# Patient Record
Sex: Male | Born: 1950 | Race: White | Hispanic: No | Marital: Married | Smoking: Never smoker
Health system: Southern US, Community
[De-identification: ages and names within clinical notes are randomized; demographics above are authoritative.]

## PROBLEM LIST (undated history)

## (undated) DIAGNOSIS — F32A Depression, unspecified: Secondary | ICD-10-CM

## (undated) DIAGNOSIS — I639 Cerebral infarction, unspecified: Secondary | ICD-10-CM

## (undated) DIAGNOSIS — F329 Major depressive disorder, single episode, unspecified: Secondary | ICD-10-CM

## (undated) DIAGNOSIS — G4730 Sleep apnea, unspecified: Secondary | ICD-10-CM

## (undated) DIAGNOSIS — I2510 Atherosclerotic heart disease of native coronary artery without angina pectoris: Secondary | ICD-10-CM

## (undated) DIAGNOSIS — I10 Essential (primary) hypertension: Secondary | ICD-10-CM

## (undated) DIAGNOSIS — I219 Acute myocardial infarction, unspecified: Secondary | ICD-10-CM

## (undated) HISTORY — DX: Depression, unspecified: F32.A

## (undated) HISTORY — DX: Major depressive disorder, single episode, unspecified: F32.9

## (undated) HISTORY — DX: Sleep apnea, unspecified: G47.30

---

## 2014-05-12 ENCOUNTER — Encounter (HOSPITAL_COMMUNITY)
Admission: EM | Disposition: A | Discharge status: Home / Self Care | Payer: BC Managed Care – PPO | Source: Other Acute Inpatient Hospital | Attending: Cardiology

## 2014-05-12 ENCOUNTER — Inpatient Hospital Stay (HOSPITAL_COMMUNITY)
Admission: EM | Admit: 2014-05-12 | Discharge: 2014-05-16 | DRG: 246 | Disposition: A | Discharge status: Home / Self Care | Payer: BC Managed Care – PPO | Source: Other Acute Inpatient Hospital | Attending: Cardiology | Admitting: Cardiology

## 2014-05-12 ENCOUNTER — Inpatient Hospital Stay (HOSPITAL_COMMUNITY): Payer: BC Managed Care – PPO

## 2014-05-12 ENCOUNTER — Encounter (HOSPITAL_COMMUNITY): Payer: Self-pay | Admitting: Emergency Medicine

## 2014-05-12 DIAGNOSIS — Z8249 Family history of ischemic heart disease and other diseases of the circulatory system: Secondary | ICD-10-CM | POA: Diagnosis not present

## 2014-05-12 DIAGNOSIS — I63441 Cerebral infarction due to embolism of right cerebellar artery: Secondary | ICD-10-CM | POA: Diagnosis present

## 2014-05-12 DIAGNOSIS — I6340 Cerebral infarction due to embolism of unspecified cerebral artery: Secondary | ICD-10-CM

## 2014-05-12 DIAGNOSIS — Z955 Presence of coronary angioplasty implant and graft: Secondary | ICD-10-CM

## 2014-05-12 DIAGNOSIS — I639 Cerebral infarction, unspecified: Secondary | ICD-10-CM

## 2014-05-12 DIAGNOSIS — I10 Essential (primary) hypertension: Secondary | ICD-10-CM | POA: Diagnosis present

## 2014-05-12 DIAGNOSIS — I4892 Unspecified atrial flutter: Secondary | ICD-10-CM | POA: Diagnosis present

## 2014-05-12 DIAGNOSIS — I63411 Cerebral infarction due to embolism of right middle cerebral artery: Secondary | ICD-10-CM | POA: Diagnosis not present

## 2014-05-12 DIAGNOSIS — I2109 ST elevation (STEMI) myocardial infarction involving other coronary artery of anterior wall: Principal | ICD-10-CM

## 2014-05-12 DIAGNOSIS — E780 Pure hypercholesterolemia: Secondary | ICD-10-CM | POA: Diagnosis present

## 2014-05-12 DIAGNOSIS — I480 Paroxysmal atrial fibrillation: Secondary | ICD-10-CM | POA: Diagnosis present

## 2014-05-12 DIAGNOSIS — I2129 ST elevation (STEMI) myocardial infarction involving other sites: Secondary | ICD-10-CM

## 2014-05-12 DIAGNOSIS — E785 Hyperlipidemia, unspecified: Secondary | ICD-10-CM

## 2014-05-12 DIAGNOSIS — G8192 Hemiplegia, unspecified affecting left dominant side: Secondary | ICD-10-CM | POA: Diagnosis present

## 2014-05-12 DIAGNOSIS — I2510 Atherosclerotic heart disease of native coronary artery without angina pectoris: Secondary | ICD-10-CM | POA: Diagnosis present

## 2014-05-12 DIAGNOSIS — M25512 Pain in left shoulder: Secondary | ICD-10-CM | POA: Diagnosis present

## 2014-05-12 DIAGNOSIS — I236 Thrombosis of atrium, auricular appendage, and ventricle as current complications following acute myocardial infarction: Secondary | ICD-10-CM | POA: Diagnosis present

## 2014-05-12 HISTORY — PX: LEFT HEART CATHETERIZATION WITH CORONARY ANGIOGRAM: SHX5451

## 2014-05-12 LAB — CBC WITH DIFFERENTIAL/PLATELET
Basophils Absolute: 0 10*3/uL (ref 0.0–0.1)
Basophils Relative: 0 % (ref 0–1)
EOS PCT: 2 % (ref 0–5)
Eosinophils Absolute: 0.1 10*3/uL (ref 0.0–0.7)
HCT: 35.1 % — ABNORMAL LOW (ref 39.0–52.0)
Hemoglobin: 12.7 g/dL — ABNORMAL LOW (ref 13.0–17.0)
LYMPHS ABS: 1.5 10*3/uL (ref 0.7–4.0)
LYMPHS PCT: 25 % (ref 12–46)
MCH: 31.9 pg (ref 26.0–34.0)
MCHC: 36.2 g/dL — ABNORMAL HIGH (ref 30.0–36.0)
MCV: 88.2 fL (ref 78.0–100.0)
MONO ABS: 0.8 10*3/uL (ref 0.1–1.0)
Monocytes Relative: 13 % — ABNORMAL HIGH (ref 3–12)
Neutro Abs: 3.8 10*3/uL (ref 1.7–7.7)
Neutrophils Relative %: 60 % (ref 43–77)
Platelets: 211 10*3/uL (ref 150–400)
RBC: 3.98 MIL/uL — AB (ref 4.22–5.81)
RDW: 12.1 % (ref 11.5–15.5)
WBC: 6.3 10*3/uL (ref 4.0–10.5)

## 2014-05-12 LAB — APTT: APTT: 39 s — AB (ref 24–37)

## 2014-05-12 LAB — COMPREHENSIVE METABOLIC PANEL
ALBUMIN: 2.9 g/dL — AB (ref 3.5–5.2)
ALT: 15 U/L (ref 0–53)
ANION GAP: 13 (ref 5–15)
AST: 15 U/L (ref 0–37)
Alkaline Phosphatase: 60 U/L (ref 39–117)
BUN: 13 mg/dL (ref 6–23)
CO2: 24 mEq/L (ref 19–32)
CREATININE: 0.92 mg/dL (ref 0.50–1.35)
Calcium: 8.6 mg/dL (ref 8.4–10.5)
Chloride: 100 mEq/L (ref 96–112)
GFR calc Af Amer: >90 mL/min (ref >90)
GFR calc non Af Amer: 90 mL/min — ABNORMAL LOW (ref >90)
Glucose, Bld: 115 mg/dL — ABNORMAL HIGH (ref 70–99)
Potassium: 3.8 mEq/L (ref 3.7–5.3)
Sodium: 137 mEq/L (ref 137–147)
TOTAL PROTEIN: 6.2 g/dL (ref 6.0–8.3)
Total Bilirubin: 0.3 mg/dL (ref 0.3–1.2)

## 2014-05-12 LAB — I-STAT TROPONIN, ED: TROPONIN I, POC: 4.5 ng/mL — AB (ref 0.00–0.08)

## 2014-05-12 LAB — CBC
HEMATOCRIT: 37.5 % — AB (ref 39.0–52.0)
Hemoglobin: 13.5 g/dL (ref 13.0–17.0)
MCH: 32 pg (ref 26.0–34.0)
MCHC: 36 g/dL (ref 30.0–36.0)
MCV: 88.9 fL (ref 78.0–100.0)
Platelets: 210 10*3/uL (ref 150–400)
RBC: 4.22 MIL/uL (ref 4.22–5.81)
RDW: 12.1 % (ref 11.5–15.5)
WBC: 7.1 10*3/uL (ref 4.0–10.5)

## 2014-05-12 LAB — PROTIME-INR
INR: 1.45 (ref 0.00–1.49)
PROTHROMBIN TIME: 17.6 s — AB (ref 11.6–15.2)

## 2014-05-12 LAB — MRSA PCR SCREENING: MRSA by PCR: NEGATIVE

## 2014-05-12 IMAGING — CT CT HEAD W/O CM
2 series · 15 of 30 positions shown, 19 images · non-contrast
Comparison: None.

CLINICAL DATA: Altered mental status ; left-sided weakness

EXAM:
CT HEAD WITHOUT CONTRAST
TECHNIQUE: Contiguous axial images were obtained from the base of the skull
through the vertex without intravenous contrast. Note that the
patient received contrast earlier in the day from cardiac
catheterization.

[Series 201: head w/o, idose (1) · axial · non-contrast · 0.44mm/px · z∈[+273,+403]mm · 13 of 32 slices shown, 17 images]
[im 3/32  brain]
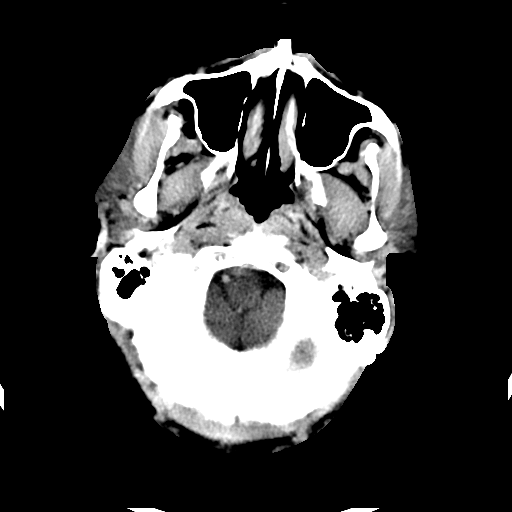
[im 3/32  bone]
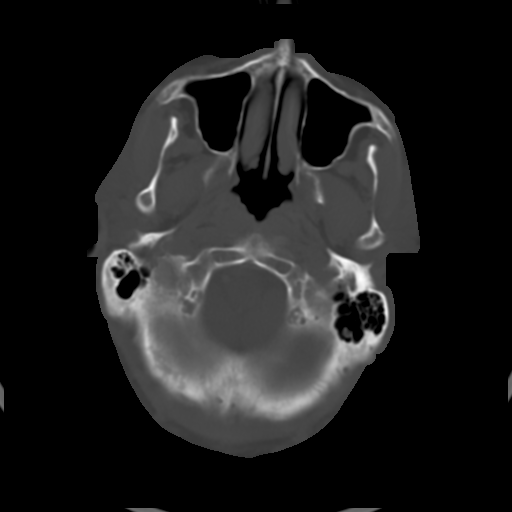
[im 5/32  brain]
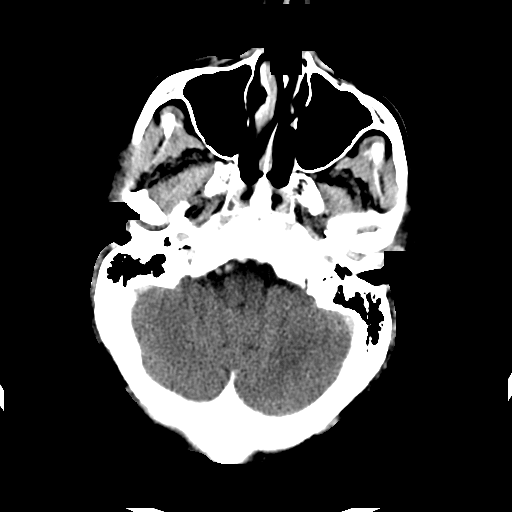
[im 7/32  brain]
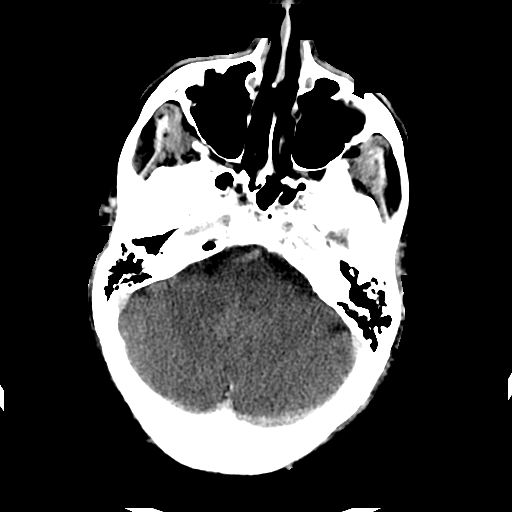
[im 9/32  brain]
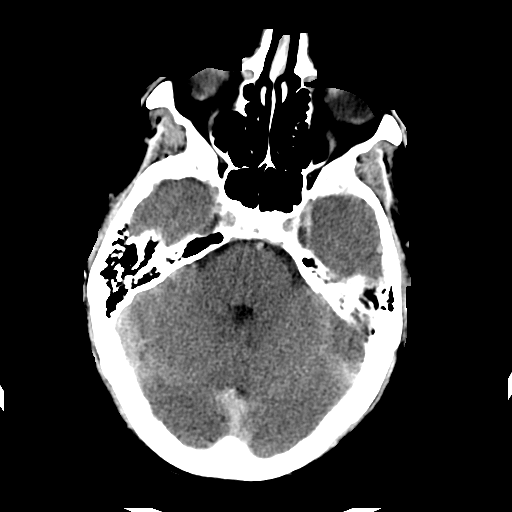
[im 12/32  brain]
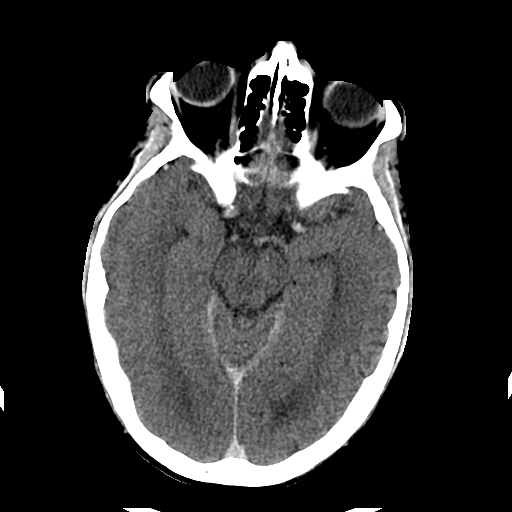
[im 12/32  bone]
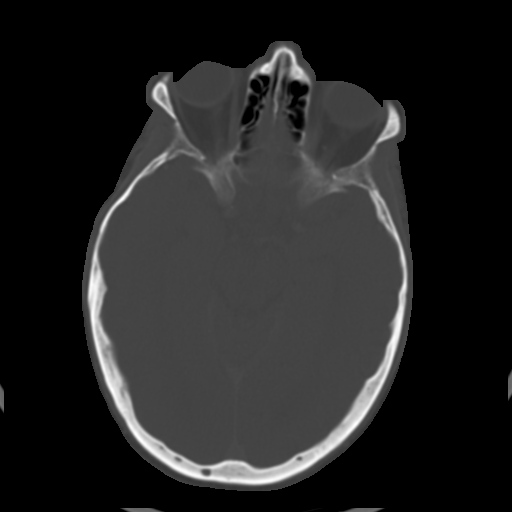
[im 14/32  brain]
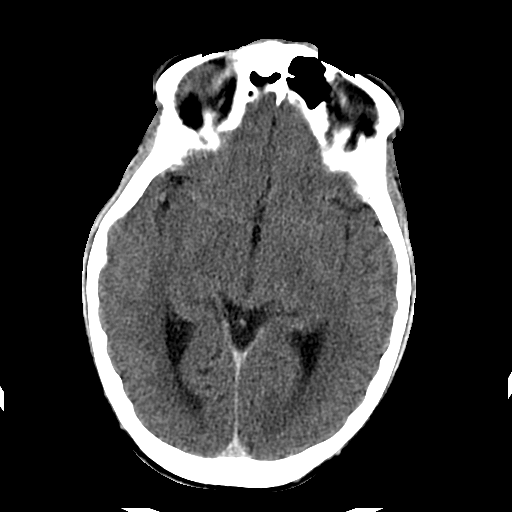
[im 16/32  brain]
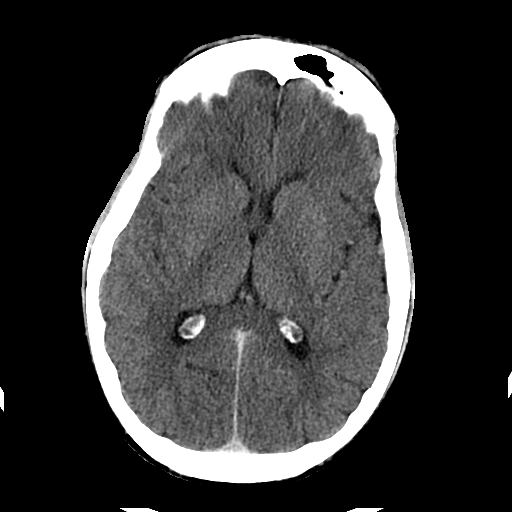
[im 18/32  brain]
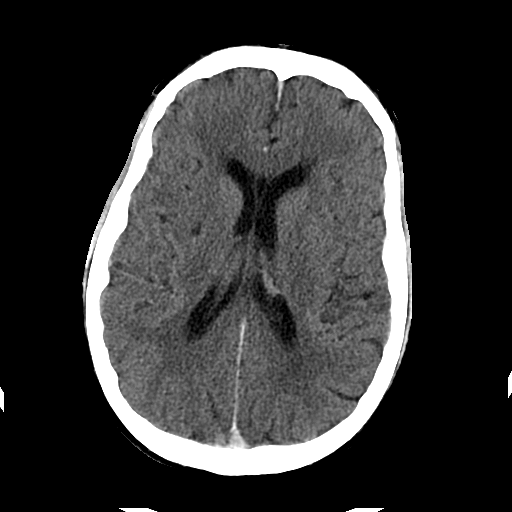
[im 20/32  brain]
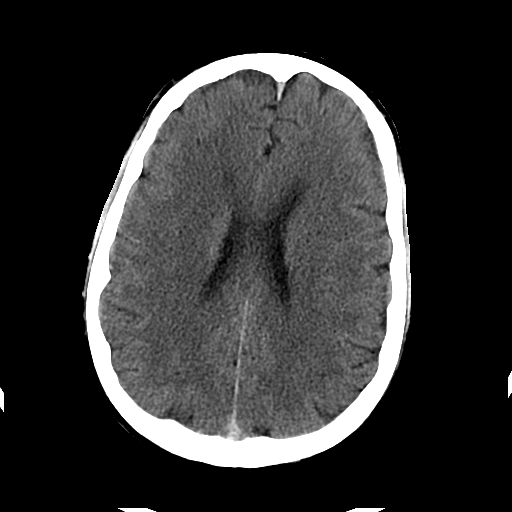
[im 20/32  bone]
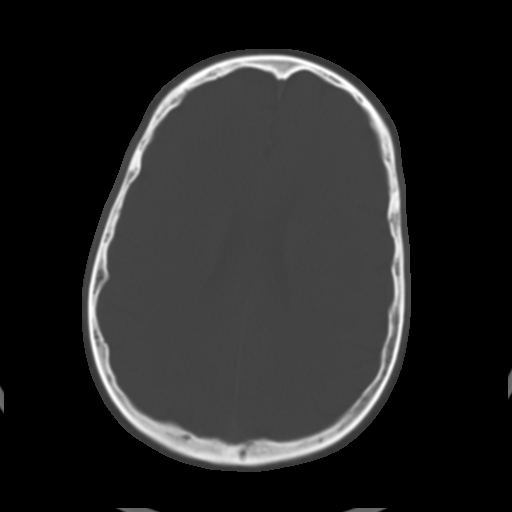
[im 23/32  brain]
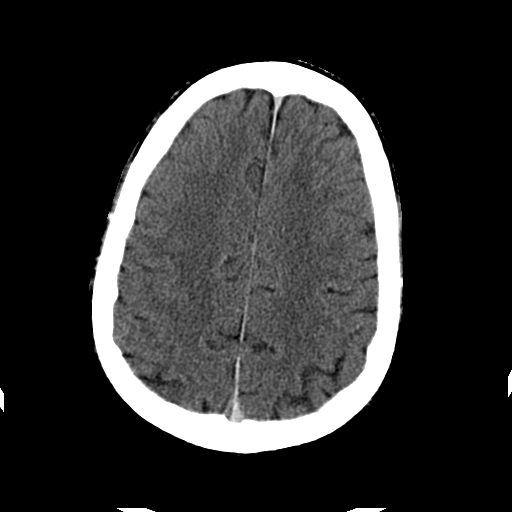
[im 25/32  brain]
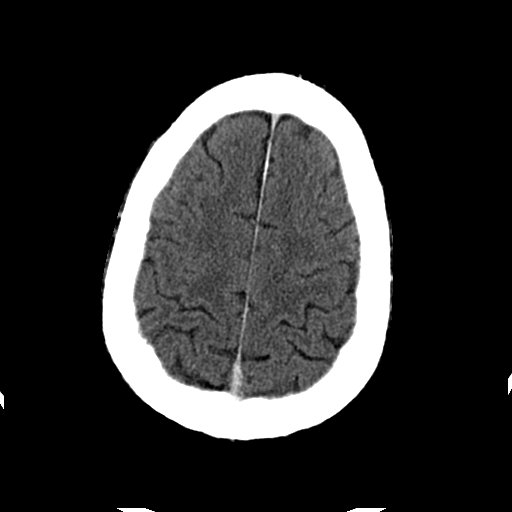
[im 27/32  brain]
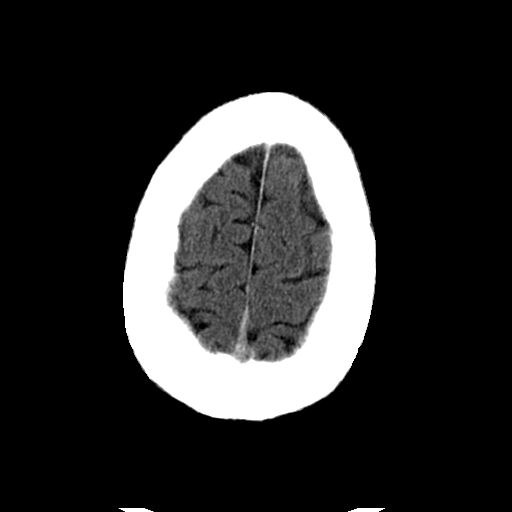
[im 29/32  brain]
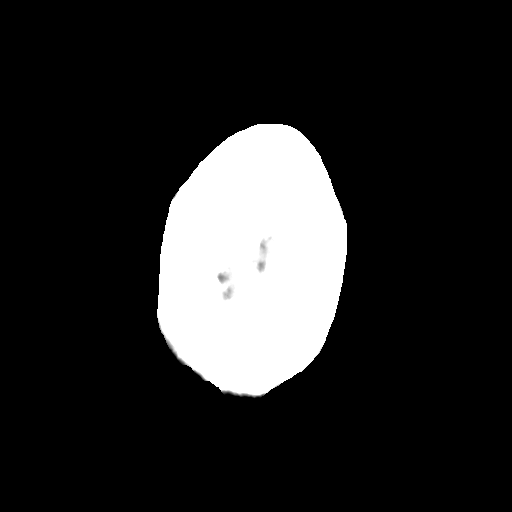
[im 29/32  bone]
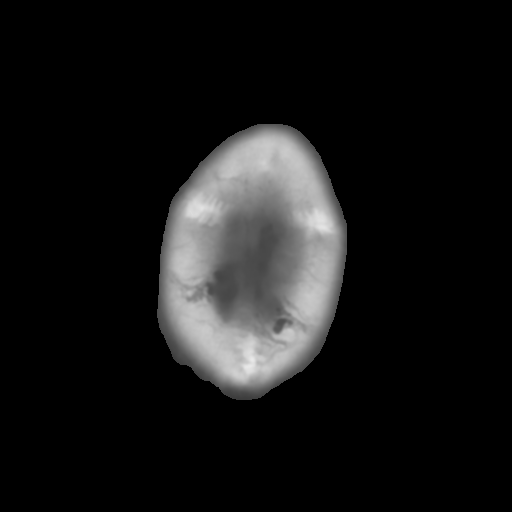

[Series 202: head w/o bone, idose (1) · axial · non-contrast · 0.44mm/px · z∈[+273,+293]mm · 2 of 32 slices shown]
[im 3/32  bone]
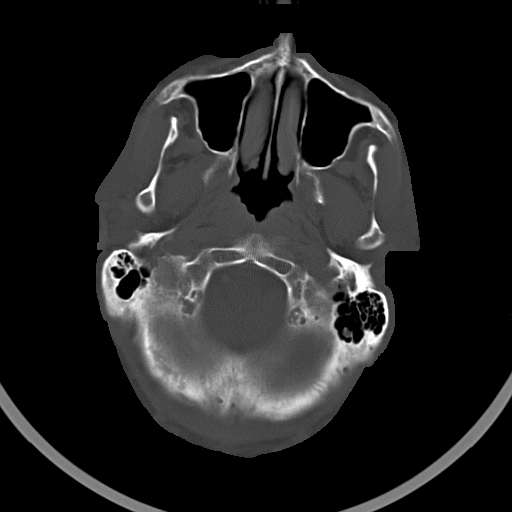
[im 7/32  bone]
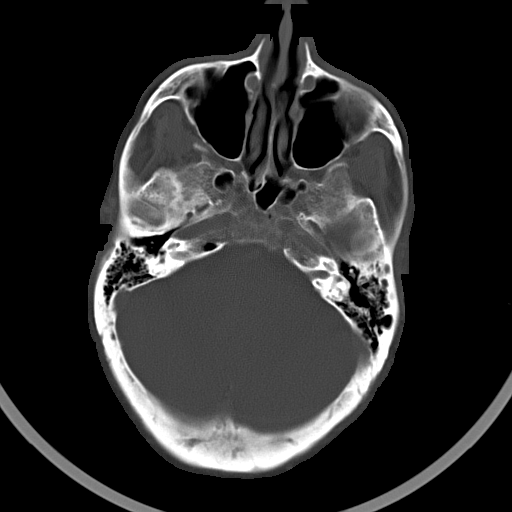

[15 of 30 positions shown; findings below may reference images not displayed]

FINDINGS: The ventricles are normal in size and configuration. There is no
appreciable mass, hemorrhage, extra-axial fluid collection, or
midline shift. There is slight small vessel disease in the centra
semiovale bilaterally. Elsewhere gray-white compartments appear
normal. There is no demonstrable acute infarct.

On axial slice 13 series 201, there is a 4 mm focus of enhancement
near the junction of the M1 and M2 segments of the right middle
cerebral artery. This finding raises question of a small aneurysm
arising from this site.

The bony calvarium appears intact.  The mastoid air cells are clear.
IMPRESSION: Question 4 mm aneurysm near the M1 -M2 junction of the right middle
cerebral artery. This finding may well warrant CT or MR angiography
to further assess. The patient has already had a dose of iodinated
contrast earlier today; advise appropriate hydration before
repeating dose of iodinated contrast nonemergently.

There is slight periventricular small vessel disease. No
intracranial mass, hemorrhage, or acute appearing infarct.

## 2014-05-12 SURGERY — LEFT HEART CATHETERIZATION WITH CORONARY ANGIOGRAM
Anesthesia: LOCAL

## 2014-05-12 MED ORDER — HEPARIN SODIUM (PORCINE) 5000 UNIT/ML IJ SOLN
INTRAMUSCULAR | Status: AC
  Filled 2014-05-12: qty 1

## 2014-05-12 MED ORDER — LIDOCAINE HCL (PF) 1 % IJ SOLN
INTRAMUSCULAR | Status: AC
  Filled 2014-05-12: qty 30

## 2014-05-12 MED ORDER — SODIUM CHLORIDE 0.9 % IV SOLN: INTRAVENOUS | Status: AC

## 2014-05-12 MED ORDER — HEPARIN (PORCINE) IN NACL 2-0.9 UNIT/ML-% IJ SOLN
INTRAMUSCULAR | Status: AC
  Filled 2014-05-12: qty 1000

## 2014-05-12 MED ORDER — HEPARIN SODIUM (PORCINE) 5000 UNIT/ML IJ SOLN
4000.0000 [IU] | Freq: Once | INTRAMUSCULAR | Status: AC
  Administered 2014-05-12: 4000 [IU] via INTRAVENOUS

## 2014-05-12 MED ORDER — ASPIRIN EC 81 MG PO TBEC
81.0000 mg | DELAYED_RELEASE_TABLET | Freq: Every day | ORAL | Status: DC
  Administered 2014-05-13 – 2014-05-16 (×4): 81 mg via ORAL
  Filled 2014-05-12 (×4): qty 1

## 2014-05-12 MED ORDER — TIROFIBAN HCL IV 12.5 MG/250 ML
0.1500 ug/kg/min | INTRAVENOUS | Status: AC
  Administered 2014-05-12 – 2014-05-13 (×2): 0.15 ug/kg/min via INTRAVENOUS
  Filled 2014-05-12: qty 250

## 2014-05-12 MED ORDER — ATORVASTATIN CALCIUM 80 MG PO TABS
80.0000 mg | ORAL_TABLET | Freq: Every day | ORAL | Status: DC
  Administered 2014-05-12 – 2014-05-15 (×4): 80 mg via ORAL
  Filled 2014-05-12 (×5): qty 1

## 2014-05-12 MED ORDER — PANTOPRAZOLE SODIUM 40 MG PO TBEC
40.0000 mg | DELAYED_RELEASE_TABLET | Freq: Every day | ORAL | Status: DC
  Administered 2014-05-13 – 2014-05-16 (×4): 40 mg via ORAL
  Filled 2014-05-12 (×4): qty 1

## 2014-05-12 MED ORDER — TICAGRELOR 90 MG PO TABS
ORAL_TABLET | ORAL | Status: AC
  Filled 2014-05-12: qty 1

## 2014-05-12 MED ORDER — ACETAMINOPHEN 325 MG PO TABS
650.0000 mg | ORAL_TABLET | ORAL | Status: DC | PRN
  Administered 2014-05-13: 650 mg via ORAL
  Filled 2014-05-12: qty 2

## 2014-05-12 MED ORDER — ONDANSETRON HCL 4 MG/2ML IJ SOLN: 4.0000 mg | Freq: Four times a day (QID) | INTRAMUSCULAR | Status: DC | PRN

## 2014-05-12 MED ORDER — TICAGRELOR 90 MG PO TABS: 90.0000 mg | ORAL_TABLET | Freq: Two times a day (BID) | ORAL | Status: DC

## 2014-05-12 MED ORDER — ASPIRIN 81 MG PO CHEW: 81.0000 mg | CHEWABLE_TABLET | Freq: Every day | ORAL | Status: DC

## 2014-05-12 MED ORDER — SODIUM CHLORIDE 0.9 % IV SOLN
0.2500 mg/kg/h | INTRAVENOUS | Status: DC
  Filled 2014-05-12: qty 250

## 2014-05-12 MED ORDER — ASPIRIN 81 MG PO CHEW: 324.0000 mg | CHEWABLE_TABLET | ORAL | Status: AC

## 2014-05-12 MED ORDER — NITROGLYCERIN 0.4 MG SL SUBL: 0.4000 mg | SUBLINGUAL_TABLET | SUBLINGUAL | Status: DC | PRN

## 2014-05-12 MED ORDER — BIVALIRUDIN 250 MG IV SOLR
INTRAVENOUS | Status: AC
  Filled 2014-05-12: qty 250

## 2014-05-12 MED ORDER — MIDAZOLAM HCL 2 MG/2ML IJ SOLN
INTRAMUSCULAR | Status: AC
  Filled 2014-05-12: qty 2

## 2014-05-12 MED ORDER — CARVEDILOL 3.125 MG PO TABS
3.1250 mg | ORAL_TABLET | Freq: Two times a day (BID) | ORAL | Status: DC
  Administered 2014-05-13 – 2014-05-16 (×7): 3.125 mg via ORAL
  Filled 2014-05-12 (×9): qty 1

## 2014-05-12 MED ORDER — SODIUM CHLORIDE 0.9 % IV SOLN: INTRAVENOUS | Status: DC

## 2014-05-12 MED ORDER — ACETAMINOPHEN 325 MG PO TABS: 650.0000 mg | ORAL_TABLET | ORAL | Status: DC | PRN

## 2014-05-12 MED ORDER — FENTANYL CITRATE 0.05 MG/ML IJ SOLN
INTRAMUSCULAR | Status: AC
  Filled 2014-05-12: qty 2

## 2014-05-12 MED ORDER — NITROGLYCERIN 0.2 MG/ML ON CALL CATH LAB
INTRAVENOUS | Status: AC
  Filled 2014-05-12: qty 1

## 2014-05-12 MED ORDER — TICAGRELOR 90 MG PO TABS
90.0000 mg | ORAL_TABLET | Freq: Two times a day (BID) | ORAL | Status: DC
  Administered 2014-05-13 – 2014-05-16 (×7): 90 mg via ORAL
  Filled 2014-05-12 (×9): qty 1

## 2014-05-12 MED ORDER — TIROFIBAN HCL IV 5 MG/100ML
INTRAVENOUS | Status: AC
  Filled 2014-05-12: qty 200

## 2014-05-12 MED ORDER — ASPIRIN 300 MG RE SUPP
300.0000 mg | RECTAL | Status: AC
  Filled 2014-05-12: qty 1

## 2014-05-12 MED ORDER — NITROGLYCERIN IN D5W 200-5 MCG/ML-% IV SOLN: 5.0000 ug/min | INTRAVENOUS | Status: DC

## 2014-05-12 MED ORDER — LISINOPRIL 2.5 MG PO TABS
2.5000 mg | ORAL_TABLET | Freq: Every day | ORAL | Status: DC
  Administered 2014-05-13 – 2014-05-16 (×4): 2.5 mg via ORAL
  Filled 2014-05-12 (×4): qty 1

## 2014-05-12 NOTE — Cardiovascular Report (Signed)
[name]:  Shields, Gregory                ACCOUNT NO.:  [id number]  MEDICAL RECORD NO.:  [id number]  LOCATION:  2H13C                        FACILITY:  [facility]  PHYSICIAN:  Sindee Stucker N. Sharyn LullHarwani, M.D. DATE OF BIRTH:  20-May-1951  DATE OF PROCEDURE:  05/12/2014 DATE OF DISCHARGE:                           CARDIAC CATHETERIZATION   PROCEDURES PERFORMED: 1. Left cardiac cath with selective left and right coronary     angiography, left ventriculography via right groin using Judkins     technique. 2. Successful percutaneous transluminal coronary angioplasty to mid     and distal left anterior descending artery using initially 2.5 x 12-     mm long Emerge balloon and then 2.5 x 20-mm long and then 2.5 x 20-     mm long Emerge balloon for predilatation. 3. Successful deployment of 2.5 x 38-mm long Xience Alpine drug-     eluting stent in distal left anterior descending artery. 4. Successful postdilatation of this stent using 2.5 x 20-mm long [place]     Emerge balloon going up to 15 atmospheric pressure. 5. Successful deployment of 3.0 x 23-mm long Xience Alpine drug-     eluting stent in mid left anterior descending artery. 6. Successful postdilatation of this stent using 3.0 x 20-mm long [place]     Emerge balloon going up to 15 atmospheric pressure.  INDICATION FOR THE PROCEDURE:  Gregory Shields is 63 year old male with no significant family history of coronary artery disease.  He came to the ER by EMS as Code STEMI was called.  The patient complained of retrosternal chest pain described as pressure last Saturday, off and on, radiating to the shoulder and arm, did not seek any medical attention. Today around 1 p.m., had weakness in the left arm, did not seek any medical attention.  In the evening when he had similar episodes of weakness and pain in the left arm, his wife called EMS.  The patient denies any chest pain, nausea, vomiting, diaphoresis.  Denies any palpitation, lightheadedness, or syncope.  Denies  PND, orthopnea, or leg swelling.  The patient is very poor historian.  EKG done in the ED showed normal sinus rhythm with Q-waves and ST elevation in lead V1-V3 and minimal ST elevation in inferior leads and also had Q-wave in lead 1, aVL suggestive of acute anterolateral wall myocardial infarction. Due to significant EKG changes, left arm numbness, weakness, discussed briefly with the patient in the ED regarding emergency left cath, possible PTCA, stenting, its risks and benefits, i.e., death, MI, stroke, need for emergency CABG, local vascular complications, etc.  and consented for PCI.  DESCRIPTION OF PROCEDURE:  After obtaining the informed consent, the patient was brought to the cath lab and was placed on fluoroscopy table. Right groin was prepped and draped in usual fashion.  A 1% Xylocaine was used for local anesthesia in the right groin.  With the help of thin wall needle, 6-French arterial sheath was placed.  The sheath was aspirated and flushed.  Next, 6-French left Judkins catheter was advanced over the wire under fluoroscopic guidance up to the ascending aorta.  Wire was pulled out.  The catheter was aspirated and connected to the Manifold.  Catheter was further advanced and engaged into left coronary ostium.  Multiple views of the left system were taken.  Next, catheter was disengaged and was pulled out over the wire and was replaced with 6-French right Judkins catheter, which was advanced over the wire under fluoroscopic guidance up to the ascending aorta.  Wire was pulled out, the catheter was aspirated and connected to the Manifold.  Catheter was further advanced and engaged into right coronary ostium.  Multiple views of the right system were taken.  Next, the catheter was disengaged and was pulled out over the wire and was replaced with 6-French pigtail catheter at the end of the procedure. This catheter was advanced over the wire under fluoroscopic guidance up to the  ascending aorta.  Wire was pulled out, the catheter was aspirated and connected to the Manifold.  Catheter was further advanced across the aortic valve into the LV.  LV pressures were recorded.  Next, left ventriculography was done in 30-degree RAO position.  Post-angiographic pressures were recorded from LV and then pullback pressures were recorded from the aorta.  There was no gradient across the aortic valve.  FINDINGS:  LV showed anterolateral mid and distal and apical wall severe hypokinesia, EF of 40-45%.  Left main was patent.  LAD has 50-60% ostial and proximal stenosis just prior to 100% occlusion beyond the midportion with TIMI 0 flow.  Diagonal 1 was moderate size, which has mild disease. Left circumflex has 60-70% ostial and proximal eccentric stenosis.  OM1 is long, which is small vessel, which is patent.  OM2 is large, which has mild disease.  RCA has 15-20% proximal and mid stenosis and 60-65% distal stenosis.  PDA has mild disease.  PLV branch is very small.  INTERVENTIONAL PROCEDURE:  Successful PTCA to mid and distal LAD was done using multiple balloons, i.e. 2.0 x 20-mm long Emerge followed by initially 2.5 x 12-mm long Emerge balloon in mid LAD and followed by 2.0 x 20-mm long Emerge balloon in distal and mid LAD and then 2.0 x 20-mm long Emerge balloon in mid and distal LAD.  Multiple inflations were done.  Angiogram showed persistent thrombus.  The patient was started on Aggrastat during the procedure and then 2.5 x 38-mm long Xience Alpine drug-eluting stent was deployed at 11 atmospheric pressure in distal LAD.  This stent was postdilated using 2.5 x 20-mm long [place] Emerge balloon going up to 15 atmospheric pressure and then 3.0 x 23-mm long Xience Alpine drug-eluting stent was deployed in mid LAD at 11 atmospheric pressure.  The stent was postdilated using 3.0 x 20-mm long [place] Emerge balloon going up to 15 atmospheric pressure.  Lesion dilated from 100% to 0%  residual with excellent TIMI grade 3 distal flow.  The patient also received prior to the procedure 180 mg of Brilinta and Angiomax prior to the procedure.  The patient tolerated the procedure well.  There were no complications.  The patient was transferred to CCU in stable condition.     Eduardo OsierMohan N. Sharyn LullHarwani, M.D.     [facility]/MEDQ  D:  05/12/2014  T:  05/12/2014  Job:  [id number]

## 2014-05-12 NOTE — Progress Notes (Signed)
eLink Physician-Brief Progress Note Patient Name: Gregory HonourMark Knies DOB: Aug 09, 1950 MRN: [id number]   Date of Service  05/12/2014  HPI/Events of Note  Pt with cp one week pta then L arm pain 10/10 with q waves anteriorly and stemi criteria so underwent LHC and admit to CCU  eICU Interventions  none        Sandrea HughsMichael Kenton Fortin 05/12/2014, 10:11 PM

## 2014-05-12 NOTE — CV Procedure (Signed)
Left cardiac cath/PTCA stenting report dictated on 05/12/2014 dictation number is [id number]

## 2014-05-12 NOTE — ED Provider Notes (Signed)
CSN: [id number]     Arrival date & time 05/12/14  1907 History   None    Chief Complaint  Patient presents with  . Code STEMI   Patient is a 63 y.o. male presenting with chest pain. The history is provided by the EMS personnel.  Chest Pain Pain location:  Substernal area Pain quality: pressure   Pain radiates to:  L arm Pain severity:  Moderate Onset quality:  Sudden Timing:  Constant Chronicity:  New Associated symptoms: shortness of breath   Associated symptoms: no abdominal pain, no back pain, no cough, no fever, no headache, no nausea and not vomiting    No past medical history on file. No past surgical history on file. No family history on file. History  Substance Use Topics  . Smoking status: Not on file  . Smokeless tobacco: Not on file  . Alcohol Use: Not on file    Review of Systems  Constitutional: Negative for fever and chills.  HENT: Negative for rhinorrhea and sore throat.   Eyes: Negative for visual disturbance.  Respiratory: Positive for shortness of breath. Negative for cough.   Cardiovascular: Positive for chest pain.  Gastrointestinal: Negative for nausea, vomiting, abdominal pain, diarrhea and constipation.  Genitourinary: Negative for dysuria and hematuria.  Musculoskeletal: Negative for back pain and neck pain.  Skin: Negative for rash.  Neurological: Negative for syncope and headaches.  Psychiatric/Behavioral: Negative for confusion.  All other systems reviewed and are negative.  Allergies  Review of patient's allergies indicates not on file.  Home Medications   Prior to Admission medications   Not on File   BP 141/84  Pulse 78  Temp(Src) 98.9 F (37.2 C) (Oral)  Resp 22  Ht 5\' 8"  (1.727 m)  Wt 173 lb 11.6 oz (78.8 kg)  BMI 26.42 kg/m2  SpO2 98% Physical Exam  Constitutional: He is oriented to person, place, and time. He appears well-developed and well-nourished. No distress.  HENT:  Head: Normocephalic and atraumatic.   Mouth/Throat: Oropharynx is clear and moist.  Eyes: EOM are normal.  Neck: Neck supple. No JVD present.  Cardiovascular: Normal rate, regular rhythm, normal heart sounds and intact distal pulses.   Pulmonary/Chest: Effort normal and breath sounds normal.  Abdominal: Soft. He exhibits no distension. There is no tenderness.  Musculoskeletal: Normal range of motion. He exhibits no edema.  Neurological: He is alert and oriented to person, place, and time. No cranial nerve deficit.  Skin: Skin is warm and dry.  Psychiatric: His behavior is normal.    ED Course  Procedures  None   Labs Review Labs Reviewed  APTT - Abnormal; Notable for the following:    aPTT 39 (*)    All other components within normal limits  CBC - Abnormal; Notable for the following:    HCT 37.5 (*)    All other components within normal limits  COMPREHENSIVE METABOLIC PANEL - Abnormal; Notable for the following:    Glucose, Bld 115 (*)    Albumin 2.9 (*)    GFR calc non Af Amer 90 (*)    All other components within normal limits  PROTIME-INR - Abnormal; Notable for the following:    Prothrombin Time 17.6 (*)    All other components within normal limits  I-STAT TROPOININ, ED - Abnormal; Notable for the following:    Troponin i, poc 4.50 (*)    All other components within normal limits  MRSA PCR SCREENING  BASIC METABOLIC PANEL  CBC  TROPONIN I  TROPONIN I  TROPONIN I  MAGNESIUM  COMPREHENSIVE METABOLIC PANEL  CBC WITH DIFFERENTIAL  LIPID PANEL   MDM   Final diagnoses:  STEMI    63 year old male with no significant past medical history presents with ST elevation noted by EMS. Patient is chest pain-free. He received her 25 of aspirin and one sublingual nitroglycerin. He seemed dynamically stable. Repeat twelve-lead shows septal and lateral ST segment elevation. Troponin 4.5. Patient stable to the Cath Lab.  Case discussed with Dr. Radford PaxBeaton.    Maris BergerJonah Kenn Rekowski, MD 05/12/14 [phone]

## 2014-05-12 NOTE — Progress Notes (Signed)
Called by primary RN to assess pt for neuro changes.  Pt has LUE, slight dysarthria, and altered affect on exam, NIH 2.  Per pt's wife she first noticed his change in speech and Lt side weakness at 1330 today but was unable to persuade him to go to the hospital.  He later developed CP and was brought to the hospital as a Code STEMI.  The LUE weakness was identified prior to transfer to the cath lab but a CT head was not completed.  Discussed the pt's case with Dr. Algie CofferKadakia & Dr. Leroy Kennedyamilo.

## 2014-05-12 NOTE — Consult Note (Signed)
Referring Physician: Dr. Terrence Dupont    Chief Complaint: left hemiparesis  HPI:                                                                                                                                         Gregory Shields is an 63 y.o. male with no significant past medical history  came to the ER by EMS as code STEMI was called and underwent left cardiac cath/PTCA and stenting earlier today.  Pt's wife indicated that she first noticed his change in speech and Lt side weakness at 1330 today but was unable to persuade him to go to the hospital. He returned from cath lab and was noted to have weakness involving left arm and leg. Denies HA, vertigo, double vision, difficulty swallowing, slurred speech, language or vision impairment. CT brain revealed no acute intracranial abnormality.  Date last known well: 05/12/14  Time last known well: 1:30 pm tPA Given: no, out of the window NIHSS: 2   History reviewed. No pertinent past medical history.  History reviewed. No pertinent past surgical history.  No family history on file. Social History:  has no tobacco, alcohol, and drug history on file.  Allergies:  Allergies  Allergen Reactions  . Penicillins     Doesn't remember reaction    Medications:                                                                                                                           Scheduled: . aspirin  324 mg Oral NOW   Or  . aspirin  300 mg Rectal NOW  . aspirin  81 mg Oral Daily  . [START ON 05/13/2014] aspirin EC  81 mg Oral Daily  . atorvastatin  80 mg Oral q1800  . [START ON 05/13/2014] carvedilol  3.125 mg Oral BID WC  . heparin      . [START ON 05/13/2014] lisinopril  2.5 mg Oral Daily  . [START ON 05/13/2014] pantoprazole  40 mg Oral Q0600  . ticagrelor  90 mg Oral BID    ROS:  History obtained from  chart review and patient  General ROS: negative for - chills, fatigue, fever, night sweats, weight gain or weight loss Psychological ROS: negative for - behavioral disorder, hallucinations, memory difficulties, mood swings or suicidal ideation Ophthalmic ROS: negative for - blurry vision, double vision, eye pain or loss of vision ENT ROS: negative for - epistaxis, nasal discharge, oral lesions, sore throat, tinnitus or vertigo Allergy and Immunology ROS: negative for - hives or itchy/watery eyes Hematological and Lymphatic ROS: negative for - bleeding problems, bruising or swollen lymph nodes Endocrine ROS: negative for - galactorrhea, hair pattern changes, polydipsia/polyuria or temperature intolerance Respiratory ROS: negative for - cough, hemoptysis, shortness of breath or wheezing Cardiovascular ROS: negative for - dyspnea on exertion, edema or irregular heartbeat Gastrointestinal ROS: negative for - abdominal pain, diarrhea, hematemesis, nausea/vomiting or stool incontinence Genito-Urinary ROS: negative for - dysuria, hematuria, incontinence or urinary frequency/urgency Musculoskeletal ROS: negative for - joint swelling or muscular weakness Neurological ROS: as noted in HPI Dermatological ROS: negative for rash and skin lesion changes  Physical exam: pleasant male in no apparent distress. Blood pressure 127/88, pulse 78, temperature 98.9 F (37.2 C), temperature source Oral, resp. rate 19, height _0  (1.727 m), weight 78.8 kg (173 lb 11.6 oz), SpO2 99.00%. Head: normocephalic. Neck: supple, no bruits, no JVD. Cardiac: no murmurs. Lungs: clear. Abdomen: soft, no tender, no mass. Extremities: no edema. Neurologic Examination:                                                                                                      General: Mental Status: Alert, oriented, thought content appropriate.  Speech fluent without evidence of aphasia.  Able to follow 3 step commands without  difficulty. Cranial Nerves: II: Discs flat bilaterally; Visual fields grossly normal, pupils equal, round, reactive to light and accommodation III,IV, VI: ptosis not present, extra-ocular motions intact bilaterally V,VII: smile symmetric, facial light touch sensation normal bilaterally VIII: hearing normal bilaterally IX,X: gag reflex present XI: bilateral shoulder shrug XII: midline tongue extension without atrophy or fasciculations  Motor: Significant for mild left arm and leg drift Tone and bulk:normal tone throughout; no atrophy noted Sensory: Pinprick and light touch intact throughout, bilaterally Deep Tendon Reflexes:  Right: Upper Extremity   Left: Upper extremity   biceps (C-5 to C-6) 2/4   biceps (C-5 to C-6) 2/4 tricep (C7) 2/4    triceps (C7) 2/4 Brachioradialis (C6) 2/4  Brachioradialis (C6) 2/4  Lower Extremity Lower Extremity  quadriceps (L-2 to L-4) 2/4   quadriceps (L-2 to L-4) 2/4 Achilles (S1) 2/4   Achilles (S1) 2/4  Plantars: Right: downgoing   Left: downgoing Cerebellar: normal finger-to-nose. Unable to test HKS Gait:  Unable to test CV: pulses palpable throughout    Results for orders placed during the hospital encounter of 05/12/14 (from the past 48 hour(s))  APTT     Status: Abnormal   Collection Time    05/12/14  5:14 PM      Result Value Ref Range   aPTT 39 (*) 24 - 37 seconds  Comment:            IF BASELINE aPTT IS ELEVATED,     SUGGEST PATIENT RISK ASSESSMENT     BE USED TO DETERMINE APPROPRIATE     ANTICOAGULANT THERAPY.  CBC     Status: Abnormal   Collection Time    05/12/14  5:14 PM      Result Value Ref Range   WBC 7.1  4.0 - 10.5 K/uL   RBC 4.22  4.22 - 5.81 MIL/uL   Hemoglobin 13.5  13.0 - 17.0 g/dL   HCT 37.5 (*) 39.0 - 52.0 %   MCV 88.9  78.0 - 100.0 fL   MCH 32.0  26.0 - 34.0 pg   MCHC 36.0  30.0 - 36.0 g/dL   RDW 12.1  11.5 - 15.5 %   Platelets 210  150 - 400 K/uL  COMPREHENSIVE METABOLIC PANEL     Status: Abnormal    Collection Time    05/12/14  5:14 PM      Result Value Ref Range   Sodium 137  137 - 147 mEq/L   Potassium 3.8  3.7 - 5.3 mEq/L   Chloride 100  96 - 112 mEq/L   CO2 24  19 - 32 mEq/L   Glucose, Bld 115 (*) 70 - 99 mg/dL   BUN 13  6 - 23 mg/dL   Creatinine, Ser 0.92  0.50 - 1.35 mg/dL   Calcium 8.6  8.4 - 10.5 mg/dL   Total Protein 6.2  6.0 - 8.3 g/dL   Albumin 2.9 (*) 3.5 - 5.2 g/dL   AST 15  0 - 37 U/L   ALT 15  0 - 53 U/L   Alkaline Phosphatase 60  39 - 117 U/L   Total Bilirubin 0.3  0.3 - 1.2 mg/dL   GFR calc non Af Amer 90 (*) >90 mL/min   GFR calc Af Amer >90  >90 mL/min   Comment: (NOTE)     The eGFR has been calculated using the CKD EPI equation.     This calculation has not been validated in all clinical situations.     eGFR's persistently <90 mL/min signify possible Chronic Kidney     Disease.   Anion gap 13  5 - 15  PROTIME-INR     Status: Abnormal   Collection Time    05/12/14  5:14 PM      Result Value Ref Range   Prothrombin Time 17.6 (*) 11.6 - 15.2 seconds   INR 1.45  0.00 - 1.49  I-STAT TROPOININ, ED     Status: Abnormal   Collection Time    05/12/14  7:19 PM      Result Value Ref Range   Troponin i, poc 4.50 (*) 0.00 - 0.08 ng/mL   Comment NOTIFIED PHYSICIAN     Comment 3            Comment: Due to the release kinetics of cTnI,     a negative result within the first hours     of the onset of symptoms does not rule out     myocardial infarction with certainty.     If myocardial infarction is still suspected,     repeat the test at appropriate intervals.  MRSA PCR SCREENING     Status: None   Collection Time    05/12/14  9:19 PM      Result Value Ref Range   MRSA by PCR NEGATIVE  NEGATIVE   Comment:  The GeneXpert MRSA Assay (FDA     approved for NASAL specimens     only), is one component of a     comprehensive MRSA colonization     surveillance program. It is not     intended to diagnose MRSA     infection nor to guide or     monitor  treatment for     MRSA infections.  CBC WITH DIFFERENTIAL     Status: Abnormal   Collection Time    05/12/14 10:12 PM      Result Value Ref Range   WBC 6.3  4.0 - 10.5 K/uL   RBC 3.98 (*) 4.22 - 5.81 MIL/uL   Hemoglobin 12.7 (*) 13.0 - 17.0 g/dL   HCT 35.1 (*) 39.0 - 52.0 %   MCV 88.2  78.0 - 100.0 fL   MCH 31.9  26.0 - 34.0 pg   MCHC 36.2 (*) 30.0 - 36.0 g/dL   RDW 12.1  11.5 - 15.5 %   Platelets 211  150 - 400 K/uL   Neutrophils Relative % 60  43 - 77 %   Neutro Abs 3.8  1.7 - 7.7 K/uL   Lymphocytes Relative 25  12 - 46 %   Lymphs Abs 1.5  0.7 - 4.0 K/uL   Monocytes Relative 13 (*) 3 - 12 %   Monocytes Absolute 0.8  0.1 - 1.0 K/uL   Eosinophils Relative 2  0 - 5 %   Eosinophils Absolute 0.1  0.0 - 0.7 K/uL   Basophils Relative 0  0 - 1 %   Basophils Absolute 0.0  0.0 - 0.1 K/uL   No results found.  Assessment: 63 y.o. male with acute anterolateral MI s/p cath/PTCA and stenting, with new onset mild left hemiparesis that developed before undergoing cardiac procedure. Syndrome consistent with right brain stroke, can not exclude cardioembolism in the context of acute anterolateral MI. On tirofitan infusion. Patient out of the window for endovascular intervention. Complete stroke work up. Aspirin. Stroke team to follow up tomorrow.  Stroke Risk Factors -none  Plan: 1. HgbA1c, fasting lipid panel 2. MRI, MRA  of the brain without contrast 3. Echocardiogram 4. Carotid dopplers 5. Prophylactic therapy-aspirin 6. Risk factor modification 7. Telemetry monitoring 8. Frequent neuro checks 9. PT/OT SLP   Dorian Pod, MD [facility] [phone]  05/12/2014, 11:39 PM

## 2014-05-12 NOTE — H&P (Signed)
Gregory Shields is an 63 y.o. male.   Chief Complaint: Left arm pain and weakness HPI: Patient is 63 year old male with no significant past medical history except for family history of coronary artery disease came to the ER by EMS as code STEMI was called. Patient complained of retrosternal chest pain pressure last Saturday off and on radiating to shoulder and arm did not seek any medical attention today around 1 PM had weakness in the left arm did not seek any medical medical attention related in the evening when he had similar episode of weakness and pain in the arm patient denies any chest pain nausea vomiting diaphoresis today denies any palpitation lightheadedness or syncope. Denies PND orthopnea leg swelling patient is a poor historian. EKG done in the ED showed normal sinus rhythm with Q waves and ST elevation in lead V1 to V4 and minimal ST elevation in inferior leads and also small Q waves in lead 1 and aVL suggestive of anterolateral wall MI.  History reviewed. No pertinent past medical history.  History reviewed. No pertinent past surgical history.  No family history on file. Social History:  has no tobacco, alcohol, and drug history on file.  Allergies:  Allergies  Allergen Reactions  . Penicillins     Doesn't remember reaction    No prescriptions prior to admission    Results for orders placed during the hospital encounter of 05/12/14 (from the past 48 hour(s))  APTT     Status: Abnormal   Collection Time    05/12/14  5:14 PM      Result Value Ref Range   aPTT 39 (*) 24 - 37 seconds   Comment:            IF BASELINE aPTT IS ELEVATED,     SUGGEST PATIENT RISK ASSESSMENT     BE USED TO DETERMINE APPROPRIATE     ANTICOAGULANT THERAPY.  CBC     Status: Abnormal   Collection Time    05/12/14  5:14 PM      Result Value Ref Range   WBC 7.1  4.0 - 10.5 K/uL   RBC 4.22  4.22 - 5.81 MIL/uL   Hemoglobin 13.5  13.0 - 17.0 g/dL   HCT 37.5 (*) 39.0 - 52.0 %   MCV 88.9  78.0 -  100.0 fL   MCH 32.0  26.0 - 34.0 pg   MCHC 36.0  30.0 - 36.0 g/dL   RDW 12.1  11.5 - 15.5 %   Platelets 210  150 - 400 K/uL  COMPREHENSIVE METABOLIC PANEL     Status: Abnormal   Collection Time    05/12/14  5:14 PM      Result Value Ref Range   Sodium 137  137 - 147 mEq/L   Potassium 3.8  3.7 - 5.3 mEq/L   Chloride 100  96 - 112 mEq/L   CO2 24  19 - 32 mEq/L   Glucose, Bld 115 (*) 70 - 99 mg/dL   BUN 13  6 - 23 mg/dL   Creatinine, Ser 0.92  0.50 - 1.35 mg/dL   Calcium 8.6  8.4 - 10.5 mg/dL   Total Protein 6.2  6.0 - 8.3 g/dL   Albumin 2.9 (*) 3.5 - 5.2 g/dL   AST 15  0 - 37 U/L   ALT 15  0 - 53 U/L   Alkaline Phosphatase 60  39 - 117 U/L   Total Bilirubin 0.3  0.3 - 1.2 mg/dL   GFR calc  non Af Amer 90 (*) >90 mL/min   GFR calc Af Amer >90  >90 mL/min   Comment: (NOTE)     The eGFR has been calculated using the CKD EPI equation.     This calculation has not been validated in all clinical situations.     eGFR's persistently <90 mL/min signify possible Chronic Kidney     Disease.   Anion gap 13  5 - 15  PROTIME-INR     Status: Abnormal   Collection Time    05/12/14  5:14 PM      Result Value Ref Range   Prothrombin Time 17.6 (*) 11.6 - 15.2 seconds   INR 1.45  0.00 - 1.49  I-STAT TROPOININ, ED     Status: Abnormal   Collection Time    05/12/14  7:19 PM      Result Value Ref Range   Troponin i, poc 4.50 (*) 0.00 - 0.08 ng/mL   Comment NOTIFIED PHYSICIAN     Comment 3            Comment: Due to the release kinetics of cTnI,     a negative result within the first hours     of the onset of symptoms does not rule out     myocardial infarction with certainty.     If myocardial infarction is still suspected,     repeat the test at appropriate intervals.   No results found.  Review of Systems  Constitutional: Negative for fever and chills.  Eyes: Negative for double vision and photophobia.  Respiratory: Negative for cough and shortness of breath.   Cardiovascular:  Negative for chest pain, palpitations and orthopnea.  Gastrointestinal: Negative for nausea and vomiting.  Genitourinary: Negative for dysuria.  Skin: Negative for itching and rash.  Neurological: Negative for dizziness and headaches.    Pulse 84, resp. rate 18, height _0  (1.727 m), weight 72.576 kg (160 lb), SpO2 98.00%. Physical Exam  Constitutional: He is oriented to person, place, and time.  HENT:  Head: Normocephalic and atraumatic.  Eyes: Conjunctivae are normal. Left eye exhibits no discharge. Scleral icterus is present.  Neck: Normal range of motion. Neck supple. JVD present. No tracheal deviation present. No thyromegaly present.  Cardiovascular: Normal rate and regular rhythm.   Murmur (Soft systolic murmur and S3 gallop noted) heard. Respiratory:  Decreased breath sound at bases clear anterolateral  GI: Soft. Bowel sounds are normal. He exhibits no distension. There is no tenderness. There is no rebound.  Musculoskeletal: He exhibits no edema and no tenderness.  Neurological: He is oriented to person, place, and time.     Assessment/Plan Probable recent acute anterolateral wall myocardial infarction Post infarct angina with atypical presentation Positive family history of coronary artery disease Plan Discussed briefly with patient regarding emergency left cath possible PTCA stenting its risk and benefits i.e. death MI stroke need for emergency CABG etc. and consented for PCI.  Padraic Marinos N 05/12/2014, 9:14 PM

## 2014-05-12 NOTE — ED Notes (Signed)
Dr Radford PaxBeaton given a copy of troponin results 4.50

## 2014-05-12 NOTE — ED Notes (Signed)
Pt transported to cathlab by this RN. Belonging in pt belonging bag with pt. Zoll on pt.

## 2014-05-12 NOTE — Progress Notes (Signed)
Chaplain responded to code stemi page. Chaplain waited for family to arrive; no family present. Please page chaplain if needed.  Wille GlaserMcCray, Miguelangel Korn O, Chaplain 05/12/2014 7:43 PM

## 2014-05-12 NOTE — ED Notes (Addendum)
Per EMS,  Pt has been feeling general malaise x 1 week. Pt began having  left arm weakness at 1400 MD Radford PaxBeaton and Darrol AngelGunada aware. Upon ems arrival pts SpO2 was 93%, EMS placed the pt on 4L [place] which brought his O2 to 98%. Pt placed on 12 lead which showed acute MI. Code stemi called, Cathlab paged in. Pt received 324 of aspirin and 1 nitro in route. Pt had altered speech upon arrival

## 2014-05-12 NOTE — Progress Notes (Signed)
Chaplain responded to page from ED to escort family member to Cath Lab. Pt wife expressed that she had "no idea" what was going on. Chaplain facilitated information sharing from medical team to pt family member and encouraged pt wife to write down any questions she may have. Chaplain provided emotional support and company for wife who said she appreciated chaplain's presence so she would not be alone. Pt wife major concern is not having insurance. Chaplain recommends this be followed up by appropriate entity. Chaplain made pt wife aware of her further services. Will follow as needed.   05/12/14 2100  Clinical Encounter Type  Visited With Family;Health care provider  Visit Type Initial;Code;Spiritual support  Referral From Nurse  Consult/Referral To Social work  Spiritual Encounters  Spiritual Needs Emotional  Stress Factors  Family Stress Factors Financial concerns;Major life changes  Jiles HaroldStamey, Hady Niemczyk F, Chaplain 05/12/2014 9:39 PM

## 2014-05-13 ENCOUNTER — Inpatient Hospital Stay (HOSPITAL_COMMUNITY): Payer: BC Managed Care – PPO

## 2014-05-13 ENCOUNTER — Encounter (HOSPITAL_COMMUNITY): Payer: Self-pay | Admitting: *Deleted

## 2014-05-13 DIAGNOSIS — I6340 Cerebral infarction due to embolism of unspecified cerebral artery: Secondary | ICD-10-CM | POA: Insufficient documentation

## 2014-05-13 LAB — COMPREHENSIVE METABOLIC PANEL
ALT: 13 U/L (ref 0–53)
AST: 12 U/L (ref 0–37)
Albumin: 2.6 g/dL — ABNORMAL LOW (ref 3.5–5.2)
Alkaline Phosphatase: 56 U/L (ref 39–117)
Anion gap: 12 (ref 5–15)
BUN: 12 mg/dL (ref 6–23)
CHLORIDE: 102 meq/L (ref 96–112)
CO2: 21 mEq/L (ref 19–32)
Calcium: 7.9 mg/dL — ABNORMAL LOW (ref 8.4–10.5)
Creatinine, Ser: 0.84 mg/dL (ref 0.50–1.35)
GFR calc Af Amer: >90 mL/min (ref >90)
GFR calc non Af Amer: >90 mL/min (ref >90)
Glucose, Bld: 99 mg/dL (ref 70–99)
Potassium: 4 mEq/L (ref 3.7–5.3)
Sodium: 135 mEq/L — ABNORMAL LOW (ref 137–147)
TOTAL PROTEIN: 5.7 g/dL — AB (ref 6.0–8.3)
Total Bilirubin: 0.3 mg/dL (ref 0.3–1.2)

## 2014-05-13 LAB — BASIC METABOLIC PANEL
Anion gap: 15 (ref 5–15)
BUN: 10 mg/dL (ref 6–23)
CALCIUM: 7.9 mg/dL — AB (ref 8.4–10.5)
CO2: 18 meq/L — AB (ref 19–32)
CREATININE: 0.85 mg/dL (ref 0.50–1.35)
Chloride: 105 mEq/L (ref 96–112)
GFR calc Af Amer: >90 mL/min (ref >90)
Glucose, Bld: 134 mg/dL — ABNORMAL HIGH (ref 70–99)
Potassium: 3.8 mEq/L (ref 3.7–5.3)
Sodium: 138 mEq/L (ref 137–147)

## 2014-05-13 LAB — CBC
HCT: 39.2 % (ref 39.0–52.0)
Hemoglobin: 13.9 g/dL (ref 13.0–17.0)
MCH: 32 pg (ref 26.0–34.0)
MCHC: 35.5 g/dL (ref 30.0–36.0)
MCV: 90.3 fL (ref 78.0–100.0)
PLATELETS: 209 10*3/uL (ref 150–400)
RBC: 4.34 MIL/uL (ref 4.22–5.81)
RDW: 12.2 % (ref 11.5–15.5)
WBC: 7.9 10*3/uL (ref 4.0–10.5)

## 2014-05-13 LAB — TROPONIN I
TROPONIN I: 1.66 ng/mL — AB (ref <0.30)
Troponin I: 2.41 ng/mL (ref <0.30)

## 2014-05-13 LAB — LIPID PANEL
CHOLESTEROL: 133 mg/dL (ref 0–200)
HDL: 29 mg/dL — ABNORMAL LOW (ref >39)
LDL Cholesterol: 90 mg/dL (ref 0–99)
Total CHOL/HDL Ratio: 4.6 RATIO
Triglycerides: 72 mg/dL (ref <150)
VLDL: 14 mg/dL (ref 0–40)

## 2014-05-13 LAB — MAGNESIUM: MAGNESIUM: 2.1 mg/dL (ref 1.5–2.5)

## 2014-05-13 LAB — HEPARIN LEVEL (UNFRACTIONATED): Heparin Unfractionated: <0.1 IU/mL — ABNORMAL LOW (ref 0.30–0.70)

## 2014-05-13 LAB — POCT ACTIVATED CLOTTING TIME: ACTIVATED CLOTTING TIME: 157 s

## 2014-05-13 MED ORDER — HEPARIN (PORCINE) IN NACL 100-0.45 UNIT/ML-% IJ SOLN
1500.0000 [IU]/h | INTRAMUSCULAR | Status: DC
  Administered 2014-05-13: 1100 [IU]/h via INTRAVENOUS
  Filled 2014-05-13 (×4): qty 250

## 2014-05-13 NOTE — Progress Notes (Signed)
Ref: Default, Provider, MD   Subjective:  Awake. More alert. Left sided weakness persist. Echocardiogram showed apical thrombus.   Objective:  Vital Signs in the last 24 hours: Temp:  [98.6 F (37 C)-98.9 F (37.2 C)] 98.6 F (37 C) (10/11 0800) Pulse Rate:  [72-95] 85 (10/11 0400) Cardiac Rhythm:  [-] Normal sinus rhythm (10/11 0800) Resp:  [13-24] 23 (10/11 1100) BP: (106-154)/(52-116) 112/83 mmHg (10/11 1100) SpO2:  [93 %-100 %] 93 % (10/11 0400) Arterial Line BP: (138-149)/(73-80) 138/73 mmHg (10/10 2330) Weight:  [72.576 kg (160 lb)-78.8 kg (173 lb 11.6 oz)] 78.8 kg (173 lb 11.6 oz) (10/10 2130)  Physical Exam: BP Readings from Last 1 Encounters:  05/13/14 112/83    Wt Readings from Last 1 Encounters:  05/12/14 78.8 kg (173 lb 11.6 oz)    Weight change:   HEENT: [place]/AT, Eyes-Conjunctiva-Pink, Sclera-Non-icteric Neck: No JVD, No bruit, Trachea midline. Lungs:  Clear, Bilateral. Cardiac:  Regular rhythm, normal S1 and S2, no S3. II/VI systolic murmur Abdomen:  Soft, non-tender. Extremities:  No edema present. No cyanosis. No clubbing. CNS: AxOx2, Cranial nerves grossly intact, moves all 4 extremities. Left handed. Left upper and lower extremity weakness and speech are improving. Skin: Warm and dry.   Intake/Output from previous day: 10/10 0701 - 10/11 0700 In: 971 [P.O.:240; I.V.:731] Out: 2000 [Urine:2000]    Lab Results: BMET    Component Value Date/Time   NA 138 05/13/2014 0305   NA 135* 05/12/2014 2212   NA 137 05/12/2014 1714   K 3.8 05/13/2014 0305   K 4.0 05/12/2014 2212   K 3.8 05/12/2014 1714   CL 105 05/13/2014 0305   CL 102 05/12/2014 2212   CL 100 05/12/2014 1714   CO2 18* 05/13/2014 0305   CO2 21 05/12/2014 2212   CO2 24 05/12/2014 1714   GLUCOSE 134* 05/13/2014 0305   GLUCOSE 99 05/12/2014 2212   GLUCOSE 115* 05/12/2014 1714   BUN 10 05/13/2014 0305   BUN 12 05/12/2014 2212   BUN 13 05/12/2014 1714   CREATININE 0.85 05/13/2014 0305   CREATININE 0.84 05/12/2014 2212   CREATININE 0.92 05/12/2014 1714   CALCIUM 7.9* 05/13/2014 0305   CALCIUM 7.9* 05/12/2014 2212   CALCIUM 8.6 05/12/2014 1714   GFRNONAA >90 05/13/2014 0305   GFRNONAA >90 05/12/2014 2212   GFRNONAA 90* 05/12/2014 1714   GFRAA >90 05/13/2014 0305   GFRAA >90 05/12/2014 2212   GFRAA >90 05/12/2014 1714   CBC    Component Value Date/Time   WBC 7.9 05/13/2014 0305   RBC 4.34 05/13/2014 0305   HGB 13.9 05/13/2014 0305   HCT 39.2 05/13/2014 0305   PLT 209 05/13/2014 0305   MCV 90.3 05/13/2014 0305   MCH 32.0 05/13/2014 0305   MCHC 35.5 05/13/2014 0305   RDW 12.2 05/13/2014 0305   LYMPHSABS 1.5 05/12/2014 2212   MONOABS 0.8 05/12/2014 2212   EOSABS 0.1 05/12/2014 2212   BASOSABS 0.0 05/12/2014 2212   HEPATIC Function Panel  Recent Labs  05/12/14 1714 05/12/14 2212  PROT 6.2 5.7*   HEMOGLOBIN A1C No components found with this basename: HGA1C,  MPG   CARDIAC ENZYMES Lab Results  Component Value Date   TROPONINI 1.66* 05/13/2014   TROPONINI 2.41* 05/12/2014   BNP No results found for this basename: PROBNP,  in the last 8760 hours TSH No results found for this basename: TSH,  in the last 8760 hours CHOLESTEROL  Recent Labs  05/13/14 0305  CHOL 133  Scheduled Meds: . aspirin  324 mg Oral NOW   Or  . aspirin  300 mg Rectal NOW  . aspirin EC  81 mg Oral Daily  . atorvastatin  80 mg Oral q1800  . carvedilol  3.125 mg Oral BID WC  . lisinopril  2.5 mg Oral Daily  . pantoprazole  40 mg Oral Q0600  . ticagrelor  90 mg Oral BID   Continuous Infusions: . sodium chloride 10 mL/hr at 05/12/14 2115  . nitroGLYCERIN     PRN Meds:.acetaminophen, nitroGLYCERIN, ondansetron (ZOFRAN) IV  Assessment/Plan: Recent large anterolateral wall myocardial infarction Recent embolic right brain stroke S/P LAD stent LV apical thrombus  Start IV heparin without bolus.    LOS: 1 day    Orpah CobbAjay Nanako Stopher  MD  05/13/2014, 11:24 AM

## 2014-05-13 NOTE — Progress Notes (Signed)
EKG CRITICAL VALUE     12 lead EKG performed.  Critical value noted.  Dawson BillsKim Burgess, RN notified.   Ernestina PennaROBERTS, Zeferino Mounts M, CCT 05/13/2014 7:33 AM

## 2014-05-13 NOTE — Progress Notes (Signed)
EKG CRITICAL VALUE     12 lead EKG performed.  Critical value noted.  Dawson BillsKim Burgess, RN notified.   Kathrina Crosley C, CCT 05/13/2014 2:51 PM

## 2014-05-13 NOTE — Progress Notes (Signed)
Nursing: At 2330 - rt femoral sheath removed. Prior to removal ACT obtained - see result. Patient educated about sheath removal process and pt verbalized in his own words the procedure. Suture cut and removed, Sheath removed and pressure held to site for 30 minutes. Romana RN at bedside during the process. Vital signs stable. See doc flowsheet. No bleeding observed once pressure removed. 2+ pulses present in lower leg. Patient reported no c/o of pain during process. Instructed the patient to hold pressure over site if he cough, sneezed and passed gas. Patient was able to demonstrate where to hold pressure.

## 2014-05-13 NOTE — Progress Notes (Signed)
PT Cancellation Note  Patient Details Name: Gregory Shields MRN: [id number] DOB: 07-20-1951   Cancelled Treatment:    Reason Eval/Treat Not Completed: Other (comment)  Discussed pt status with RN; Given cardiac apical thrombus, and MRI findings of acute CVA, she requests we hold PT for today (more related to pt and family being overwhelmed);  Will follow-up for PT evaluation tomorrow;   Thanks,  Van ClinesHolly Arianie Shields, PT  Acute Rehabilitation Services Pager [phone] Office [phone]    Van ClinesGarrigan, Gregory Clover [facility] 05/13/2014, 4:58 PM

## 2014-05-13 NOTE — Progress Notes (Signed)
STROKE TEAM PROGRESS NOTE   HISTORY Gregory Shields is an 63 y.o. male with no significant past medical history came to the ER by EMS as code STEMI was called and underwent left cardiac cath/PTCA and stenting earlier today.  Pt's wife indicated that she first noticed his change in speech and Lt side weakness at 1330 today but was unable to persuade him to go to the hospital.  He returned from cath lab and was noted to have weakness involving left arm and leg.  Denies HA, vertigo, double vision, difficulty swallowing, slurred speech, language or vision impairment.  CT brain revealed no acute intracranial abnormality.  Date last known well: 05/12/14  Time last known well: 1:30 pm  tPA Given: no, out of the window  NIHSS: 2  SUBJECTIVE (INTERVAL HISTORY) His exam is at the bedside. Per patient he was feeling out of sorts yesterday, wife called ambulance and he had a possible stroke. He feels better today, poor historian due to psychomotor slowing (not his baseline per wife). Denies weakness, denies vision changes.   OBJECTIVE Temp:  [98.5 F (36.9 C)-98.9 F (37.2 C)] 98.6 F (37 C) (10/11 1600) Pulse Rate:  [72-115] 86 (10/11 1600) Cardiac Rhythm:  [-] Atrial fibrillation (10/11 1600) Resp:  [13-27] 27 (10/11 1600) BP: (102-154)/(52-116) 102/57 mmHg (10/11 1600) SpO2:  [90 %-100 %] 90 % (10/11 1600) Arterial Line BP: (138-149)/(73-80) 138/73 mmHg (10/10 2330) Weight:  [160 lb (72.576 kg)-173 lb 11.6 oz (78.8 kg)] 173 lb 11.6 oz (78.8 kg) (10/10 2130)  No results found for this basename: GLUCAP,  in the last 168 hours  Recent Labs Lab 05/12/14 1714 05/12/14 2212 05/13/14 0305  NA 137 135* 138  K 3.8 4.0 3.8  CL 100 102 105  CO2 24 21 18*  GLUCOSE 115* 99 134*  BUN 13 12 10   CREATININE 0.92 0.84 0.85  CALCIUM 8.6 7.9* 7.9*  MG  --  2.1  --     Recent Labs Lab 05/12/14 1714 05/12/14 2212  AST 15 12  ALT 15 13  ALKPHOS 60 56  BILITOT 0.3 0.3  PROT 6.2 5.7*  ALBUMIN 2.9*  2.6*    Recent Labs Lab 05/12/14 1714 05/12/14 2212 05/13/14 0305  WBC 7.1 6.3 7.9  NEUTROABS  --  3.8  --   HGB 13.5 12.7* 13.9  HCT 37.5* 35.1* 39.2  MCV 88.9 88.2 90.3  PLT 210 211 209    Recent Labs Lab 05/12/14 2212 05/13/14 0305  TROPONINI 2.41* 1.66*    Recent Labs  05/12/14 1714  LABPROT 17.6*  INR 1.45   No results found for this basename: COLORURINE, APPERANCEUR, LABSPEC, PHURINE, GLUCOSEU, HGBUR, BILIRUBINUR, KETONESUR, PROTEINUR, UROBILINOGEN, NITRITE, LEUKOCYTESUR,  in the last 72 hours     Component Value Date/Time   CHOL 133 05/13/2014 0305   TRIG 72 05/13/2014 0305   HDL 29* 05/13/2014 0305   CHOLHDL 4.6 05/13/2014 0305   VLDL 14 05/13/2014 0305   LDLCALC 90 05/13/2014 0305   No results found for this basename: HGBA1C   No results found for this basename: labopia, cocainscrnur, labbenz, amphetmu, thcu, labbarb    No results found for this basename: ETH,  in the last 168 hours  Ct Head Wo Contrast  05/13/2014   CLINICAL DATA:  IMPRESSION: 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 This finding may well warrant CT or MR angiography to further assess. The patient has already had a dose of  iodinated contrast earlier today; advise appropriate hydration before repeating dose of iodinated contrast nonemergently.  There is slight periventricular small vessel disease. No intracranial mass, hemorrhage, or acute appearing infarct.   Electronically Signed   By: Bretta BangWilliam  Woodruff M.D.   On: 05/13/2014 07:38   Mr [name] Head Wo Contrast/MR of the brain  05/13/2014   ADDENDUM REPORT: 05/13/2014 14:36  ADDENDUM: Study discussed by telephone with [name] of the Stroke Service on 05/13/2014 at 14:34 .   Electronically Signed   By: Augusto GambleLee  Hall M.D.   On: 05/13/2014 14:36   05/13/2014   CLINICAL DATA:  63 year old male with acute left side weakness and difficulty speaking. Initial encounter. Reported cardiac thrombus on ECHO.   EXAM: MRI HEAD WITHOUT CONTRAST  MRA HEAD WITHOUT CONTRAST  TECHNIQUE: IMPRESSION: 1. 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 2. Scattered small acute right MCA territory infarcts. No mass effect or hemorrhage.  Electronically Signed: By: Augusto GambleLee  Hall M.D. On: 05/13/2014 14:08    PHYSICAL EXAM Physical exam: Exam: Gen: NAD,  Eyes: anicteric sclerae, moist conjunctivae                    CV: RRR, no MRG Mental Status: Alert, psychomotor slowing, following commands  Neuro: Detailed Neurologic Exam  Speech:    No aphasia, no dysarthria, psychomotor slowing  Cranial Nerves:    The pupils are equal, round, and reactive to light. EOMI. Face Symmetric.    Motor Observation:    no involuntary movements noted.     Strength:    Mild left arm weakness 4/5 deltoid otherwise intact, left drift      ASSESSMENT/PLAN  [age] y.o. male with acute anterolateral MI s/p cath/PTCA and stenting, with new onset mild left hemiparesis that developed before undergoing cardiac procedure. 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 2. Scattered small acute right MCA territory infarcts.Patient out of the window for endovascular intervention. In the setting of Afib and acute anterolateral MI. Echo showed Apical Thrombus, on Heparin.    Carotid Doppler : Pending 2D Echo: Echocardiogram showed apical thrombus.   LDL 90  HgbA1c pending Hypertension  Permissive hypertension <220/120 for 24-48 hours and then gradually normalize within 5-7 days BP goal long term normotensive Pending workup may need to discuss ongoing anticoagulation - will recommend after workup complete  Hospital day # 1  I have personally examined this patient, reviewed notes, independently viewed imaging studies, participated in medical decision making and plan of care. I have made any additions or clarifications directly to the above note. Agree with note above.  Artemio Alyoni  Ahern, MD  [facility]  Pager: [phone] 05/11/2014 5:16 PM     To contact Stroke Continuity provider, please refer to [email or web address]. After hours, contact General Neurology

## 2014-05-13 NOTE — Progress Notes (Signed)
Notified Dr Lucia GaskinsAhern of pt's completed MRI and that the radiologists would like to speak with her concerning results.  Dawson BillsKim Cydney Alvarenga, RN

## 2014-05-13 NOTE — Progress Notes (Signed)
ANTICOAGULATION CONSULT NOTE - Follow-up Consult  Pharmacy Consult for heparin Indication: LV thrombus  Allergies  Allergen Reactions  . Penicillins Other (See Comments)    Doesn't remember reaction    Patient Measurements: Height: 5\' 8"  (172.7 cm) Weight: 173 lb 11.6 oz (78.8 kg) IBW/kg (Calculated) : 68.4 Heparin Dosing Weight: 78kg  Vital Signs: Temp: 98.6 F (37 C) (10/11 1600) Temp Source: Oral (10/11 1600) BP: 135/76 mmHg (10/11 1900) Pulse Rate: 97 (10/11 1900)  Labs:  Recent Labs  05/12/14 1714 05/12/14 2212 05/13/14 0305 05/13/14 1811  HGB 13.5 12.7* 13.9  --   HCT 37.5* 35.1* 39.2  --   PLT 210 211 209  --   APTT 39*  --   --   --   LABPROT 17.6*  --   --   --   INR 1.45  --   --   --   HEPARINUNFRC  --   --   --  <0.10*  CREATININE 0.92 0.84 0.85  --   TROPONINI  --  2.41* 1.66*  --     Estimated Creatinine Clearance: 89.4 ml/min (by C-G formula based on Cr of 0.85).   Medical History: History reviewed. No pertinent past medical history.  Assessment: 63 year old male s/p MI with LAD stent placed last night. Noted to have recent embolic right brain stroke. Pt on heparin for LV apical thrombus seen on ECHO. Heparin level undetectable. No issues with line. Pt is bleeding a small amount from left line site - RN will call us if bleeding worsens.  Goal of Therapy:  Heparin level 0.3-0.7 units/ml Monitor platelets by anticoagulation protocol: Yes   Plan:  1) Increase heparin gtt to 1350 units/hr 2) Will f/u 6 hour heparin level  Christoper Fabianaron Derrin Currey, PharmD, BCPS Clinical pharmacist, pager [phone] 05/13/2014 7:16 PM

## 2014-05-13 NOTE — Progress Notes (Signed)
ANTICOAGULATION CONSULT NOTE - Initial Consult  Pharmacy Consult for heparin Indication: LV thrombus Allergies  Allergen Reactions  . Penicillins     Doesn't remember reaction    Patient Measurements: Height: 5\' 8"  (172.7 cm) Weight: 173 lb 11.6 oz (78.8 kg) IBW/kg (Calculated) : 68.4 Heparin Dosing Weight: 78kg  Vital Signs: Temp: 98.6 F (37 C) (10/11 0800) Temp Source: Oral (10/11 0800) BP: 112/83 mmHg (10/11 1100) Pulse Rate: 85 (10/11 0400)  Labs:  Recent Labs  05/12/14 1714 05/12/14 2212 05/13/14 0305  HGB 13.5 12.7* 13.9  HCT 37.5* 35.1* 39.2  PLT 210 211 209  APTT 39*  --   --   LABPROT 17.6*  --   --   INR 1.45  --   --   CREATININE 0.92 0.84 0.85  TROPONINI  --  2.41* 1.66*    Estimated Creatinine Clearance: 89.4 ml/min (by C-G formula based on Cr of 0.85).   Medical History: History reviewed. No pertinent past medical history.  Assessment: 63 year old male s/p MI with LAD stent placed last night. Noted to have recent embolic right brain stroke. Echo today shows LV apical thrombus, new orders to start IV heparin.  CBC normal this am. No complications from cath noted.  Goal of Therapy:  Heparin level 0.3-0.7 units/ml Monitor platelets by anticoagulation protocol: Yes   Plan:  Start heparin infusion at 1100 units/hr units/hr Check anti-Xa level in 6 hours and daily while on heparin Continue to monitor H&H and platelets  Sheppard CoilFrank Rye Dorado PharmD., BCPS Clinical Pharmacist Pager [phone] 05/13/2014 11:46 AM

## 2014-05-13 NOTE — Progress Notes (Signed)
Echo Lab  2D Echocardiogram completed.  Namiah Dunnavant L Karee Christopherson, RDCS 05/13/2014 2:35 PM

## 2014-05-13 NOTE — Progress Notes (Signed)
Patient converted back into NSR on the cardiac monitor. No changes in care at this time.

## 2014-05-13 NOTE — Progress Notes (Signed)
Spoke with Dr Algie CofferKadakia about pt's new MRI results; and of patients heart rhythm change from ST to Atrial fibrilation, confirmed by EKG.   Dawson BillsKim Reichen Hutzler, RN

## 2014-05-14 DIAGNOSIS — I2109 ST elevation (STEMI) myocardial infarction involving other coronary artery of anterior wall: Principal | ICD-10-CM

## 2014-05-14 DIAGNOSIS — E785 Hyperlipidemia, unspecified: Secondary | ICD-10-CM

## 2014-05-14 DIAGNOSIS — I6340 Cerebral infarction due to embolism of unspecified cerebral artery: Secondary | ICD-10-CM

## 2014-05-14 LAB — CBC
HEMATOCRIT: 36.2 % — AB (ref 39.0–52.0)
Hemoglobin: 12.7 g/dL — ABNORMAL LOW (ref 13.0–17.0)
MCH: 31.3 pg (ref 26.0–34.0)
MCHC: 35.1 g/dL (ref 30.0–36.0)
MCV: 89.2 fL (ref 78.0–100.0)
Platelets: 221 10*3/uL (ref 150–400)
RBC: 4.06 MIL/uL — ABNORMAL LOW (ref 4.22–5.81)
RDW: 12.2 % (ref 11.5–15.5)
WBC: 8.3 10*3/uL (ref 4.0–10.5)

## 2014-05-14 LAB — HEPARIN LEVEL (UNFRACTIONATED)
Heparin Unfractionated: 0.15 IU/mL — ABNORMAL LOW (ref 0.30–0.70)
Heparin Unfractionated: 0.36 IU/mL (ref 0.30–0.70)

## 2014-05-14 LAB — LIPID PANEL
CHOL/HDL RATIO: 4.5 ratio
Cholesterol: 126 mg/dL (ref 0–200)
HDL: 28 mg/dL — ABNORMAL LOW (ref >39)
LDL Cholesterol: 80 mg/dL (ref 0–99)
Triglycerides: 90 mg/dL (ref <150)
VLDL: 18 mg/dL (ref 0–40)

## 2014-05-14 LAB — POCT ACTIVATED CLOTTING TIME: ACTIVATED CLOTTING TIME: 354 s

## 2014-05-14 LAB — HEMOGLOBIN A1C
Hgb A1c MFr Bld: 5.1 % (ref <5.7)
MEAN PLASMA GLUCOSE: 100 mg/dL (ref <117)

## 2014-05-14 MED ORDER — AMIODARONE HCL 200 MG PO TABS
200.0000 mg | ORAL_TABLET | Freq: Every day | ORAL | Status: DC
  Administered 2014-05-14 – 2014-05-15 (×2): 200 mg via ORAL
  Filled 2014-05-14 (×2): qty 1

## 2014-05-14 MED ORDER — STROKE: EARLY STAGES OF RECOVERY BOOK
Freq: Once | Status: AC
  Administered 2014-05-14: 09:00:00
  Filled 2014-05-14: qty 1

## 2014-05-14 MED ORDER — APIXABAN 5 MG PO TABS
5.0000 mg | ORAL_TABLET | Freq: Two times a day (BID) | ORAL | Status: DC
  Administered 2014-05-14 – 2014-05-16 (×5): 5 mg via ORAL
  Filled 2014-05-14 (×6): qty 1

## 2014-05-14 MED FILL — Sodium Chloride IV Soln 0.9%: INTRAVENOUS | Qty: 50 | Status: AC

## 2014-05-14 NOTE — Progress Notes (Signed)
1420-1500 Cardiac Rehab On arrival pt had just finished working with OT and PT earlier. He states that he is tired, so ambulation held. Started MI and stent education with pt. I gave him MI booklet and stent card. We discussed MI and CVA. I discussed the importance  of taking all of his medications. I discussed Bril[place]. He voices understanding. We will follow pt tomorrow to ambulate and continue education. Beatrix FettersHughes, Graciella Arment G, RN 05/14/2014 3:01 PM

## 2014-05-14 NOTE — Evaluation (Signed)
Physical Therapy Evaluation Patient Details Name: Gregory Shields MRN: [id number] DOB: 06/16/1951 Today's Date: 05/14/2014   History of Present Illness  Pt adm with STEMI and underwent emergent cath and PCI. Pt also with cardioembolic rt CVA. MRI showed scattered small acute right MCA territory infarcts  Clinical Impression  Pt doing well with mobility. Not back to baseline with mobility but close. Gait is tentative and pt with slowed cognition. Will follow acutely but do not feel he will need follow up PT at DC.      Follow Up Recommendations No PT follow up    Equipment Recommendations  None recommended by PT    Recommendations for Other Services       Precautions / Restrictions Precautions Precautions: None      Mobility  Bed Mobility Overal bed mobility: Modified Independent             General bed mobility comments: Incr time  Transfers Overall transfer level: Needs assistance Equipment used: None Transfers: Sit to/from Stand Sit to Stand: Supervision            Ambulation/Gait Ambulation/Gait assistance: Supervision;Modified independent (Device/Increase time) Ambulation Distance (Feet): 550 Feet Assistive device: None Gait Pattern/deviations: Step-through pattern     General Gait Details: Pt with steady gait but appears tentative. Slowed mildly with head turns.  Stairs            Wheelchair Mobility    Modified Rankin (Stroke Patients Only) Modified Rankin (Stroke Patients Only) Pre-Morbid Rankin Score: No symptoms Modified Rankin: No significant disability     Balance Overall balance assessment: Needs assistance Sitting-balance support: No upper extremity supported Sitting balance-Leahy Scale: Normal     Standing balance support: No upper extremity supported Standing balance-Leahy Scale: Normal           Rhomberg - Eyes Opened: 30   High level balance activites: Turns;Direction changes;Head turns High Level Balance  Comments: No loss of balance with any of above. Slightly slowed.             Pertinent Vitals/Pain Pain Assessment: No/denies pain    Home Living Family/patient expects to be discharged to:: Private residence Living Arrangements: Spouse/significant other;Children Available Help at Discharge: Family Type of Home: House       Home Layout: Multi-level Home Equipment: None      Prior Function Level of Independence: Independent         Comments: Works as Psychologist, prison and probation servicesplumbing contractor     Hand Dominance        Extremity/Trunk Assessment   Upper Extremity Assessment: Defer to OT evaluation           Lower Extremity Assessment: Overall WFL for tasks assessed         Communication   Communication: No difficulties  Cognition Arousal/Alertness: Awake/alert Behavior During Therapy: Flat affect Overall Cognitive Status: Impaired/Different from baseline Area of Impairment: Problem solving             Problem Solving: Slow processing      General Comments      Exercises        Assessment/Plan    PT Assessment    PT Diagnosis Abnormality of gait   PT Problem List    PT Treatment Interventions     PT Goals (Current goals can be found in the Care Plan section) Acute Rehab PT Goals Patient Stated Goal: Return home PT Goal Formulation: With patient Time For Goal Achievement: 05/18/14 Potential to Achieve Goals: Good Additional Goals Additional Goal #  1: Pt will amb in busy hallway at normal speed without tentative gait.    Frequency     Barriers to discharge        Co-evaluation               End of Session Equipment Utilized During Treatment: Gait belt Activity Tolerance: Patient tolerated treatment well Patient left: in chair;with call bell/phone within reach;with family/visitor present Nurse Communication: Mobility status         Time: 1217-1233 PT Time Calculation (min): 16 min   Charges:   PT Evaluation $Initial PT Evaluation  Tier I: 1 Procedure PT Treatments $Gait Training: 8-22 mins   PT G Codes:          Rollen Selders 05/14/2014, 1:46 PM  [employer] Idelle Reimann PT [phone]

## 2014-05-14 NOTE — Progress Notes (Signed)
PT Cancellation Note  Patient Details Name: Gregory Shields MRN: [id number] DOB: [birth year]   Cancelled Treatment:    Reason Eval/Treat Not Completed: Other (comment) (Pt with bedrest orders)   Jackson SouthMAYCOCK,[name] 05/14/2014, 10:53 AM  [facility] Ny[facility] Wolfson PT [phone]

## 2014-05-14 NOTE — Progress Notes (Signed)
EKG CRITICAL VALUE     12 lead EKG performed.  Critical value noted.  Lianne Morisose Cullon RN notified.   Aniqua Briere H, CCT 05/14/2014 7:06 AM

## 2014-05-14 NOTE — Care Management Note (Addendum)
    Page 1 of 2   05/16/2014     3:58:15 PM CARE MANAGEMENT NOTE 05/16/2014  Patient:  Gregory Shields,Gregory Shields   Account Number:  [id number]  Date Initiated:  05/14/2014  Documentation initiated by:  Junius CreamerWELL,DEBBIE  Subjective/Objective Assessment:   adm w stemi     Action/Plan:   lives w fam   Anticipated DC Date:  05/16/2014   Anticipated DC Plan:  HOME/SELF CARE  In-house referral  Financial Counselor  Clinical Social Worker      DC Planning Services  CM consult  Medication Assistance  OP Neuro Rehab      Choice offered to / List presented to:             Status of service:   Medicare Important Message given?   (If response is "NO", the following Medicare IM given date fields will be blank) Date Medicare IM given:   Medicare IM given by:   Date Additional Medicare IM given:   Additional Medicare IM given by:    Discharge Disposition:  HOME/SELF CARE  Per UR Regulation:  Reviewed for med. necessity/level of care/duration of stay  If discussed at Long Length of Stay Meetings, dates discussed:   05/17/2014    Comments:  10/14  1555 debbie Valora Norell rn,bsn md put in for home ot but we needed outpt phy ther. tried to fix order and faxed order and form for [facility]pt [facility]. wife has address and phone number if they don't call to set up her appt for her husband.  10/14  1453 debbie Tashe Purdon rn,bsn pt for dc home. ambul well w card rehab and phy ther. occup ther would likeoutpt ot but no orders at present. pt did not want hhc but wanted to follow up w md and poss go to cardiac rehab.  10/13  1523 debbie Toyoko Silos rn,bsn gave pt 30day free and copay assist card for eliquis. pt amb w ther around unit.  10/13  1052 debbie Malikiah Debarr rn,bsn spoke w mrs Gero. pt lives w wife and 16yo da. 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 have req [facility] financail rep speak w wife. have made sw ref. will follow for hhc needs. wife has concerns about prev ins and new ins  there being a lapse in coverage. will cont to follow.  10/12 1045a debbie Tamre Cass rn,bsn gave pt 30day free and copay assist card for brilinta. left pt assist form on shadow chart. pt states he has ins but can't remeber name of ins company.

## 2014-05-14 NOTE — Progress Notes (Signed)
Occupational Therapy Evaluation Patient Details Name: Gregory Shields MRN: [id number] DOB: 01-27-1951 Today's Date: 05/14/2014    History of Present Illness Pt adm with STEMI and underwent emergent cath and PCI. Pt also with cardioembolic rt CVA. MRI showed scattered small acute right MCA territory infarcts in the BG, temporal, frontal and occipital lobes (MRI)   Clinical Impression   PTA, pt independent with mobility and ADL and was self employed as a Nutritional therapistplumber. Pt presents  with mild L inattention and apparent cognitive deficits with visuospatial and executive skills and delayed recall. Pt scored a 21/30 on the MOCA [facility](Montreal Cognitive Assessment) - score below 26 is considered impaired. At this time, recommend pt d/C home with 24/7 S and follow up with neuro outpt OT. Also recommend pt refrain from driving at this time. Will follow acutely to facilitate safe D/C home and address established goals.     Follow Up Recommendations  Outpatient OT;Supervision/Assistance - 24 hour;Other (comment) (neuro outpt)    Equipment Recommendations  None recommended by OT    Recommendations for Other Services       Precautions / Restrictions Precautions Precautions: Other (comment) Precaution Comments: mild L inattention      Mobility Bed Mobility Overal bed mobility: Modified Independent             General bed mobility comments: Incr time  Transfers Overall transfer level: Needs assistance Equipment used: None Transfers: Sit to/from Stand Sit to Stand: Supervision              Balance Overall balance assessment: Needs assistance Sitting-balance support: No upper extremity supported Sitting balance-Leahy Scale: Normal     Standing balance support: No upper extremity supported Standing balance-Leahy Scale: Normal           Rhomberg - Eyes Opened: 30   High level balance activites: Turns;Direction changes;Head turns High Level Balance Comments: No loss of balance with  any of above. Slightly slowed.            ADL Overall ADL's : Needs assistance/impaired                                     Functional mobility during ADLs: Supervision/safety General ADL Comments: set up/supervision for ADL     Vision      wears glasses for reading           Additional Comments: will further assess. Demonstrated increased difficulty seeing items in L visual field Will further assess.    Perception Perception Perception Tested?: Yes Perception Deficits: Inattention/neglect;Spatial orientation Inattention/Neglect: Impaired- to be further tested in functional context (mild L inattention) Spatial deficits: difficulty with "cube copy and clock copy"   Praxis Praxis Praxis tested?: Within functional limits    Pertinent Vitals/Pain Pain Assessment: No/denies pain     Hand Dominance Left   Extremity/Trunk Assessment Upper Extremity Assessment Upper Extremity Assessment: LUE deficits/detail LUE Deficits / Details: strength overall WFL. LUE Coordination: decreased fine motor ("clumsy" L hand. dropping items) Nursing reports pt seeming "not aware" of what L hand was doing, i.e. Spilling drink when holding it with his L hand   Lower Extremity Assessment Lower Extremity Assessment: Defer to PT evaluation   Cervical / Trunk Assessment Cervical / Trunk Assessment: Normal   Communication Communication Communication: No difficulties   Cognition Arousal/Alertness: Awake/alert Behavior During Therapy: Flat affect Overall Cognitive Status: Impaired/Different from baseline Area of Impairment: Memory;Awareness;Problem solving  Memory: Decreased short-term memory (0/5 delayed recall)     Awareness: Emergent Problem Solving: Slow processing General Comments: scored 21/30 on the MOCA (montreal cognitive Assessment) demonstrating dificulty with visuospatial skills, executive functions and delayed recall.   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    OT Diagnosis: Generalized weakness;Cognitive deficits;Disturbance of vision   OT Problem List: Decreased strength;Decreased coordination;Decreased cognition;Decreased safety awareness;Impaired UE functional use   OT Treatment/Interventions: Self-care/ADL training;Therapeutic exercise;Neuromuscular education;Therapeutic activities;Cognitive remediation/compensation;Visual/perceptual remediation/compensation;Patient/family education    OT Goals(Current goals can be found in the care plan section) Acute Rehab OT Goals Patient Stated Goal: go back to work OT Goal Formulation: With patient Time For Goal Achievement: 05/28/14 Potential to Achieve Goals: Good  OT Frequency: Min 2X/week   Barriers to D/C:            Co-evaluation              End of Session Nurse Communication: Mobility status;Other (comment) (D/C needs)  Activity Tolerance: Patient tolerated treatment well Patient left: in bed;with call bell/phone within reach   Time: 1412-1433 OT Time Calculation (min): 21 min Charges:  OT General Charges $OT Visit: 1 Procedure OT Evaluation $Initial OT Evaluation Tier I: 1 Procedure OT Treatments $Therapeutic Activity: 8-22 mins G-Codes:    Oren Barella,HILLARY 05/14/2014, 2:42 PM   [facility] Bertil Brickey, OTR/L  [phone] 05/14/2014

## 2014-05-14 NOTE — Progress Notes (Signed)
Subjective:  Events of weekend noted. Patient denies any chest pain or shortness of breath. Denies further weakness in the arms. Denies any slurred speech. Overall feels much better. No further episodes of A. fib flutter converted spontaneously back to normal sinus rhythm last night  Objective:  Vital Signs in the last 24 hours: Temp:  [97.9 F (36.6 C)-98.9 F (37.2 C)] 97.9 F (36.6 C) (10/12 1114) Pulse Rate:  [70-115] 73 (10/12 1100) Resp:  [13-27] 22 (10/12 1100) BP: (95-142)/(45-96) 107/62 mmHg (10/12 1100) SpO2:  [90 %-98 %] 95 % (10/12 1100)  Intake/Output from previous day: 10/11 0701 - 10/12 0700 In: 1143.9 [P.O.:802; I.V.:341.9] Out: 2075 [Urine:2075] Intake/Output from this shift: Total I/O In: 60 [I.V.:60] Out: 500 [Urine:500]  Physical Exam: Neck: no adenopathy, no carotid bruit, no JVD and supple, symmetrical, trachea midline Lungs: clear to auscultation bilaterally Heart: regular rate and rhythm, S1, S2 normal and Soft systolic murmur noted Abdomen: soft, non-tender; bowel sounds normal; no masses,  no organomegaly Extremities: extremities normal, atraumatic, no cyanosis or edema and Right groin stable  Lab Results:  Recent Labs  05/13/14 0305 05/14/14 0308  WBC 7.9 8.3  HGB 13.9 12.7*  PLT 209 221    Recent Labs  05/12/14 2212 05/13/14 0305  NA 135* 138  K 4.0 3.8  CL 102 105  CO2 21 18*  GLUCOSE 99 134*  BUN 12 10  CREATININE 0.84 0.85    Recent Labs  05/12/14 2212 05/13/14 0305  TROPONINI 2.41* 1.66*   Hepatic Function Panel  Recent Labs  05/12/14 2212  PROT 5.7*  ALBUMIN 2.6*  AST 12  ALT 13  ALKPHOS 56  BILITOT 0.3    Recent Labs  05/14/14 0308  CHOL 126   No results found for this basename: PROTIME,  in the last 72 hours  Imaging: Imaging results have been reviewed and Ct Head Wo Contrast  05/13/2014   CLINICAL DATA:  Altered mental status ; left-sided weakness  EXAM: CT HEAD WITHOUT CONTRAST  TECHNIQUE:  Contiguous axial images were obtained from the base of the skull through the vertex without intravenous contrast. Note that the patient received contrast earlier in the day from cardiac catheterization.  COMPARISON:  None.  FINDINGS: The ventricles are normal in size and configuration. There is no appreciable mass, hemorrhage, extra-axial fluid collection, or midline shift. There is slight small vessel disease in the centra semiovale bilaterally. Elsewhere gray-white compartments appear normal. There is no demonstrable acute infarct.  On axial slice 13 series 201, there is a 4 mm focus of enhancement near the junction of the M1 and M2 segments of the right middle cerebral artery. This finding raises question of a small aneurysm arising from this site.  The bony calvarium appears intact.  The mastoid air cells are clear.  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 This finding may well warrant CT or MR angiography to further assess. The patient has already had a dose of iodinated contrast earlier today; advise appropriate hydration before repeating dose of iodinated contrast nonemergently.  There is slight periventricular small vessel disease. No intracranial mass, hemorrhage, or acute appearing infarct.   Electronically Signed   By: Bretta Bang M.D.   On: 05/13/2014 07:38   [name] ZO Contrast  05/13/2014   ADDENDUM REPORT: 05/13/2014 14:36  ADDENDUM: Study discussed by telephone with Dr. Lucia Gaskins of the Stroke Service on 05/13/2014 at 14:34 .   Electronically  Signed   By: Augusto GambleLee  Hall M.D.   On: 05/13/2014 14:36   05/13/2014   CLINICAL DATA:  63 year old male with acute left side weakness and difficulty speaking. Initial encounter. Reported cardiac thrombus on ECHO.  EXAM: MRI HEAD WITHOUT CONTRAST  MRA HEAD WITHOUT CONTRAST  TECHNIQUE: Multiplanar, multiecho pulse sequences of the brain and surrounding structures were obtained without intravenous  contrast. Angiographic images of the head were obtained using MRA technique without contrast.  COMPARISON:  Head CT without contrast 05/12/2014.  FINDINGS: MRI HEAD FINDINGS  Scattered and sometimes confluent foci of restricted diffusion in the right hemisphere. Right basal ganglia and external capsule affected. Scattered involvement of the right temporal lobe, mostly the superior temporal gyrus. Scattered involvement of the frontal operculum. Some involvement of the anterior and lateral right occipital lobe.  No contralateral left hemisphere or posterior fossa restricted diffusion. Major intracranial vascular flow voids are preserved, however, there is asymmetric increased signal in the right MCA posterior sylvian division (series 8, image 18). See MRA findings below.  Mild T2 and FLAIR hyperintensity at the areas of restricted diffusion. Additional scattered nonspecific cerebral white matter T2 and FLAIR hyperintensity. No midline shift, mass effect, or evidence of intracranial mass lesion. No acute intracranial hemorrhage identified. No cortical encephalomalacia. Brainstem and cerebellum within normal limits. No ventriculomegaly.  Visible internal auditory structures appear normal. Small volume retained secretions in the nasopharynx. Minor paranasal sinus mucosal thickening. Visualized orbit soft tissues are within normal limits. Negative pituitary, cervicomedullary junction visualized cervical spine. Normal bone marrow signal. Visualized scalp soft tissues are within normal limits.  MRA HEAD FINDINGS  Antegrade flow in the posterior circulation with dominant distal left vertebral artery. Normal right PICA origin. Patent vertebrobasilar junction. No basilar artery stenosis. Dominant left AICA. SCA and left PCA origins are within normal limits. Fetal type right PCA origin. Mild left PCA P2 segment irregularity and stenosis, otherwise visualized bilateral PCA branches are within normal limits.  Antegrade flow in  both ICA siphons, symmetric. No siphon stenosis. Ophthalmic and right posterior communicating artery origins are within normal limits. Patent carotid termini. Normal MCA and ACA origins. Anterior communicating artery and visualized bilateral ACA branches are within normal limits. Left MCA branches are within normal limits.  The right MCA M1 segment is subtotally occluded 10 mm beyond its origin with poor distal right MCA bifurcation and M2 branch flow signal (series 607, image 12).  IMPRESSION: 1. 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 2. Scattered small acute right MCA territory infarcts. No mass effect or hemorrhage.  Electronically Signed: By: Augusto GambleLee  Hall M.D. On: 05/13/2014 14:08   Mr Brain Wo Contrast  05/13/2014   ADDENDUM REPORT: 05/13/2014 14:36  ADDENDUM: Study discussed by telephone with [name] of the Stroke Service on 05/13/2014 at 14:34 .   Electronically Signed   By: Augusto GambleLee  Hall M.D.   On: 05/13/2014 14:36   05/13/2014   CLINICAL DATA:  63 year old male with acute left side weakness and difficulty speaking. Initial encounter. Reported cardiac thrombus on ECHO.  EXAM: MRI HEAD WITHOUT CONTRAST  MRA HEAD WITHOUT CONTRAST  TECHNIQUE: Multiplanar, multiecho pulse sequences of the brain and surrounding structures were obtained without intravenous contrast. Angiographic images of the head were obtained using MRA technique without contrast.  COMPARISON:  Head CT without contrast 05/12/2014.  FINDINGS: MRI HEAD FINDINGS  Scattered and sometimes confluent foci of restricted diffusion in the right hemisphere. Right basal ganglia and external capsule affected.  Scattered involvement of the right temporal lobe, mostly the superior temporal gyrus. Scattered involvement of the frontal operculum. Some involvement of the anterior and lateral right occipital lobe.  No contralateral left hemisphere or posterior fossa restricted diffusion. Major intracranial vascular flow voids are  preserved, however, there is asymmetric increased signal in the right MCA posterior sylvian division (series 8, image 18). See MRA findings below.  Mild T2 and FLAIR hyperintensity at the areas of restricted diffusion. Additional scattered nonspecific cerebral white matter T2 and FLAIR hyperintensity. No midline shift, mass effect, or evidence of intracranial mass lesion. No acute intracranial hemorrhage identified. No cortical encephalomalacia. Brainstem and cerebellum within normal limits. No ventriculomegaly.  Visible internal auditory structures appear normal. Small volume retained secretions in the nasopharynx. Minor paranasal sinus mucosal thickening. Visualized orbit soft tissues are within normal limits. Negative pituitary, cervicomedullary junction visualized cervical spine. Normal bone marrow signal. Visualized scalp soft tissues are within normal limits.  MRA HEAD FINDINGS  Antegrade flow in the posterior circulation with dominant distal left vertebral artery. Normal right PICA origin. Patent vertebrobasilar junction. No basilar artery stenosis. Dominant left AICA. SCA and left PCA origins are within normal limits. Fetal type right PCA origin. Mild left PCA P2 segment irregularity and stenosis, otherwise visualized bilateral PCA branches are within normal limits.  Antegrade flow in both ICA siphons, symmetric. No siphon stenosis. Ophthalmic and right posterior communicating artery origins are within normal limits. Patent carotid termini. Normal MCA and ACA origins. Anterior communicating artery and visualized bilateral ACA branches are within normal limits. Left MCA branches are within normal limits.  The right MCA M1 segment is subtotally occluded 10 mm beyond its origin with poor distal right MCA bifurcation and M2 branch flow signal (series 607, image 12).  IMPRESSION: 1. 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 2. Scattered small acute right MCA territory infarcts.  No mass effect or hemorrhage.  Electronically Signed: By: Augusto GambleLee  Hall M.D. On: 05/13/2014 14:08    Cardiac Studies:  Assessment/Plan:  Status post recent large anterolateral wall myocardial infarction with apical LV thrombus status post emergency PCI to mid and distal LAD with excellent results Status post cardioembolic right CVA with left paresis improved prior to PCI. 2 vessel coronary artery disease Status post paroxysmal A. fib flutter now converted back to sinus rhythm Hypercholesteremia Positive family history of coronary artery disease Plan Change IV heparin to Eliquis 5 mg by mouth twice a day Start low-dose amiodarone as per orders Check labs in a.m. Phase I cardiac rehabilitation Okay to ambulate  LOS: 2 days    Kadasia Kassing N 05/14/2014, 11:35 AM

## 2014-05-14 NOTE — Progress Notes (Addendum)
STROKE TEAM PROGRESS NOTE   HISTORY Gregory Shields is an 63 y.o. male with no significant past medical history came to the ER by EMS as code STEMI was called and underwent left cardiac cath/PTCA and stenting earlier today.  Pt's wife indicated that she first noticed his change in speech and Lt side weakness at 1330 today but was unable to persuade him to go to the hospital.  He returned from cath lab and was noted to have weakness involving left arm and leg.  Denies HA, vertigo, double vision, difficulty swallowing, slurred speech, language or vision impairment.  CT brain revealed no acute intracranial abnormality.  Date last known well: 05/12/14  Time last known well: 1:30 pm  tPA Given: no, out of the window  NIHSS: 2  SUBJECTIVE (INTERVAL HISTORY) His exam is at the bedside. Per patient he was feeling out of sorts yesterday, wife called ambulance and he had a possible stroke. He feels better today, poor historian due to psychomotor slowing (not his baseline per wife). Denies weakness, denies vision changes.   OBJECTIVE Temp:  [97.6 F (36.4 C)-98.9 F (37.2 C)] 97.6 F (36.4 C) (10/12 1606) Pulse Rate:  [70-86] 73 (10/12 1606) Cardiac Rhythm:  [-] Normal sinus rhythm (10/12 2030) Resp:  [14-28] 28 (10/12 2000) BP: (95-142)/(45-96) 113/89 mmHg (10/12 2000) SpO2:  [91 %-98 %] 97 % (10/12 1600)  No results found for this basename: GLUCAP,  in the last 168 hours  Recent Labs Lab 05/12/14 1714 05/12/14 2212 05/13/14 0305  NA 137 135* 138  K 3.8 4.0 3.8  CL 100 102 105  CO2 24 21 18*  GLUCOSE 115* 99 134*  BUN 13 12 10   CREATININE 0.92 0.84 0.85  CALCIUM 8.6 7.9* 7.9*  MG  --  2.1  --     Recent Labs Lab 05/12/14 1714 05/12/14 2212  AST 15 12  ALT 15 13  ALKPHOS 60 56  BILITOT 0.3 0.3  PROT 6.2 5.7*  ALBUMIN 2.9* 2.6*    Recent Labs Lab 05/12/14 1714 05/12/14 2212 05/13/14 0305 05/14/14 0308  WBC 7.1 6.3 7.9 8.3  NEUTROABS  --  3.8  --   --   HGB 13.5 12.7*  13.9 12.7*  HCT 37.5* 35.1* 39.2 36.2*  MCV 88.9 88.2 90.3 89.2  PLT 210 211 209 221    Recent Labs Lab 05/12/14 2212 05/13/14 0305  TROPONINI 2.41* 1.66*    Recent Labs  05/12/14 1714  LABPROT 17.6*  INR 1.45   No results found for this basename: COLORURINE, APPERANCEUR, LABSPEC, PHURINE, GLUCOSEU, HGBUR, BILIRUBINUR, KETONESUR, PROTEINUR, UROBILINOGEN, NITRITE, LEUKOCYTESUR,  in the last 72 hours     Component Value Date/Time   CHOL 126 05/14/2014 0308   TRIG 90 05/14/2014 0308   HDL 28* 05/14/2014 0308   CHOLHDL 4.5 05/14/2014 0308   VLDL 18 05/14/2014 0308   LDLCALC 80 05/14/2014 0308   Lab Results  Component Value Date   HGBA1C 5.1 05/14/2014   No results found for this basename: labopia,  cocainscrnur,  labbenz,  amphetmu,  thcu,  labbarb    No results found for this basename: ETH,  in the last 168 hours  Ct Head Wo Contrast  05/13/2014   IMPRESSION: 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 This finding may well warrant CT or MR angiography to further assess. The patient has already had a dose of iodinated contrast earlier today; advise appropriate hydration before repeating dose  of iodinated contrast nonemergently.  There is slight periventricular small vessel disease. No intracranial mass, hemorrhage, or acute appearing infarct.      [name] Wo Contrast/MR of the brain 05/13/2014   IMPRESSION: 1. 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 2. Scattered small acute right MCA territory infarcts. No mass effect or hemorrhage.    CUS - 1-39% ICA stenosis. Vertebral artery flow is antegrade.   2D echo - - Left ventricle: The cavity size was normal. Systolic function was mildly reduced. The estimated ejection fraction was in the range of 45% to 50%. There is akinesis of the apicalanteroseptal and apical myocardium. There was a thrombus. There was an apical thrombus. - Atrial septum:  No defect or patent foramen ovale was identified.  LDL 90 and A1C 5.1  PHYSICAL EXAM Physical exam  Temp:  [97.6 F (36.4 C)-98.9 F (37.2 C)] 97.6 F (36.4 C) (10/12 1606) Pulse Rate:  [70-86] 73 (10/12 1606) Resp:  [14-28] 28 (10/12 2000) BP: (95-142)/(45-96) 113/89 mmHg (10/12 2000) SpO2:  [91 %-98 %] 97 % (10/12 1600)  General - Well nourished, well developed, in no apparent distress.  Ophthalmologic - Sharp disc margins OU.  Cardiovascular - Regular rate and rhythm with no murmur.  Mental Status -  Level of arousal and orientation to time, place, and person were intact. Language including expression, naming, repetition, comprehension was assessed and found intact.  Cranial Nerves II - XII - II - Visual field intact OU. III, IV, VI - Extraocular movements intact. V - Facial sensation intact bilaterally. VII - Facial movement intact bilaterally. VIII - Hearing & vestibular intact bilaterally. X - Palate elevates symmetrically. XI - Chin turning & shoulder shrug intact bilaterally. XII - Tongue protrusion intact.  Motor Strength - The patient's strength was normal in all extremities except left dexterity deficit and pronator drift was present at left.  Bulk was normal and fasciculations were absent.   Motor Tone - Muscle tone was assessed at the neck and appendages and was normal.  Reflexes - The patient's reflexes were normal in all extremities and he had no pathological reflexes.  Sensory - Light touch, temperature/pinprick were assessed and were normal.    Coordination - The patient had normal movements in the hands and feet with no ataxia or dysmetria.  Tremor was absent.  Gait and Station - not tested due to safety concern.  ASSESSMENT/PLAN 63 y.o. male had CP one week before admission and then developed left hemiparesis, found to have acute anterolateral  MI followed by cath/PTCA and stenting. MRA showed subtotal occlusion of the right MCA M1 segment, poor  distal right MCA flow. MRI showed scattered small acute right MCA territory infarcts consistent with embolic pattern. Echo found to have LV thrombus. Also found to have paroxysmal a flutter. Currently pt on ASA, brilinta, and heparin drip.   Stroke - cardioembolic due to LV thrombus and pAflutter/Afib  MRI small acute right MCA territory infarcts   MRA subtotal occlusion of the right MCA M1 segment  Carotid Doppler unremarkable  2D Echo: Echocardiogram showed LV apical thrombus.   LDL 90  HgbA1c 5.1  On heparin drip now. Also on ASA and brilinta.   From neurology standpoint, heparin drip is sufficient. If antiplatelet needed for cardiac prevention, recommend monotherapy to avoid high risk of bleeding including hemorrhagic transformation.  STEMI s/p cath/PTCA and stenting - paroxysmal A flutter confirmed by EKG - CP resolved.  - on heparin  drip as well as ASA and brilinta.  - recommend monotherapy of antiplatelet to avoid high risk of bleeding.  Hypertension  Permissive hypertension <220/120 for 24-48 hours and then gradually normalize within 5-7 days BP goal long term normotensive  Hyperlipidemia - LDL 90 - goal of LDL < 70 - continue lipitor  Hospital day # 2  This patient is critically ill due to STEMI and LV thrombus on heparin drip and at significant risk of neurological worsening, death form hemorrhagic transformation, recurrent stroke. This patient's care requires constant monitoring of vital signs, hemodynamics, respiratory and cardiac monitoring, review of multiple databases, neurological assessment, discussion with family, other specialists and medical decision making of high complexity. I spent 35 minutes of neurocritical care time in the care of this patient.   Marvel PlanJindong Aanya Haynes, MD PhD Stroke Neurology 05/14/2014 9:57 PM    To contact Stroke Continuity provider, please refer to [email or web address]. After hours, contact General Neurology

## 2014-05-14 NOTE — Progress Notes (Signed)
VASCULAR LAB PRELIMINARY  PRELIMINARY  PRELIMINARY  PRELIMINARY  Carotid Dopplers completed.    Preliminary report:  1-39% ICA stenosis.  Vertebral artery flow is antegrade.   Devyne Hauger, RVT 05/14/2014, 11:48 AM

## 2014-05-14 NOTE — Progress Notes (Signed)
OT Cancellation Note  Patient Details Name: Gregory Shields MRN: [id number] DOB: 04-02-1951   Cancelled Treatment:    Reason Eval/Treat Not Completed: Other (comment) Note most recent activity order is still bedrest. Please update as appropriate. Will check back later time.  Judithann SaugerStone, Jontavious Commons [place] 05/14/2014, 11:10 AM

## 2014-05-14 NOTE — Progress Notes (Signed)
ANTICOAGULATION CONSULT NOTE - Follow Up Consult  Pharmacy Consult for heparin Indication: LV thrombus  Labs:  Recent Labs  05/12/14 1714 05/12/14 2212 05/13/14 0305 05/13/14 1811 05/14/14 0308  HGB 13.5 12.7* 13.9  --  12.7*  HCT 37.5* 35.1* 39.2  --  36.2*  PLT 210 211 209  --  221  APTT 39*  --   --   --   --   LABPROT 17.6*  --   --   --   --   INR 1.45  --   --   --   --   HEPARINUNFRC  --   --   --  <0.10* 0.15*  CREATININE 0.92 0.84 0.85  --   --   TROPONINI  --  2.41* 1.66*  --   --     Assessment: 63yo male remains subtherapeutic on heparin after rate increase.  Goal of Therapy:  Heparin level 0.3-0.7 units/ml   Plan:  Will increase heparin gtt by 2 units/kg/hr to 1500 units/hr and check level in 6hr.  Vernard GamblesVeronda Dashanna Kinnamon, PharmD, BCPS  05/14/2014,4:33 AM

## 2014-05-15 ENCOUNTER — Other Ambulatory Visit: Payer: Self-pay | Admitting: Neurology

## 2014-05-15 DIAGNOSIS — I2129 ST elevation (STEMI) myocardial infarction involving other sites: Secondary | ICD-10-CM

## 2014-05-15 DIAGNOSIS — I4892 Unspecified atrial flutter: Secondary | ICD-10-CM

## 2014-05-15 DIAGNOSIS — I6340 Cerebral infarction due to embolism of unspecified cerebral artery: Secondary | ICD-10-CM

## 2014-05-15 DIAGNOSIS — I63411 Cerebral infarction due to embolism of right middle cerebral artery: Secondary | ICD-10-CM

## 2014-05-15 LAB — CBC
HEMATOCRIT: 39.4 % (ref 39.0–52.0)
Hemoglobin: 13.9 g/dL (ref 13.0–17.0)
MCH: 31.2 pg (ref 26.0–34.0)
MCHC: 35.3 g/dL (ref 30.0–36.0)
MCV: 88.3 fL (ref 78.0–100.0)
Platelets: 244 10*3/uL (ref 150–400)
RBC: 4.46 MIL/uL (ref 4.22–5.81)
RDW: 12.2 % (ref 11.5–15.5)
WBC: 7.3 10*3/uL (ref 4.0–10.5)

## 2014-05-15 LAB — BASIC METABOLIC PANEL
ANION GAP: 13 (ref 5–15)
BUN: 8 mg/dL (ref 6–23)
CHLORIDE: 105 meq/L (ref 96–112)
CO2: 20 mEq/L (ref 19–32)
Calcium: 8.2 mg/dL — ABNORMAL LOW (ref 8.4–10.5)
Creatinine, Ser: 0.95 mg/dL (ref 0.50–1.35)
GFR calc non Af Amer: 89 mL/min — ABNORMAL LOW (ref >90)
Glucose, Bld: 115 mg/dL — ABNORMAL HIGH (ref 70–99)
Potassium: 4 mEq/L (ref 3.7–5.3)
SODIUM: 138 meq/L (ref 137–147)

## 2014-05-15 LAB — TROPONIN I: TROPONIN I: 1.25 ng/mL — AB (ref <0.30)

## 2014-05-15 LAB — POCT ACTIVATED CLOTTING TIME: Activated Clotting Time: 371 seconds

## 2014-05-15 LAB — GLUCOSE, CAPILLARY
GLUCOSE-CAPILLARY: 112 mg/dL — AB (ref 70–99)
Glucose-Capillary: 86 mg/dL (ref 70–99)

## 2014-05-15 MED ORDER — AMIODARONE HCL 200 MG PO TABS
400.0000 mg | ORAL_TABLET | Freq: Two times a day (BID) | ORAL | Status: DC
  Administered 2014-05-15 – 2014-05-16 (×2): 400 mg via ORAL
  Filled 2014-05-15 (×3): qty 2

## 2014-05-15 NOTE — Progress Notes (Signed)
PT Cancellation Note  Patient Details Name: Gregory Shields MRN: [id number] DOB: [birth year]   Cancelled Treatment:    Reason Eval/Treat Not Completed: Attempted PT session x2 and pt unavailable both times. Pt just finished up with OT upon first attempt, and pt with cardiac rehab upon second attempt. Will try back again tomorrow.    Conni SlipperKirkman, Gregory Shields 05/15/2014, 2:29 PM  Conni SlipperLaura Evon Shields, PT, DPT Acute Rehabilitation Services Pager: [phone]

## 2014-05-15 NOTE — Progress Notes (Signed)
Pt converted to NSR at 2101. VSS.

## 2014-05-15 NOTE — Progress Notes (Signed)
Subjective:  Patient denies any chest pain or shortness of breath. Denies any weakness in the arms or legs. Denies any slurred speech. Patient converted back again into A. fib flutter with controlled ventricular response. Patient tolerating dual antiplatelet medication and Eliquis . Patient is off heparin. Discussed with patient and his wife at length regarding increased risk of bleeding and possibly conversion of stroke to hemorrhagic stroke with dual antiplatelet medication and anticoagulants versus benefits and agree with the above. Patient had 2 long drug-eluting stents in LAD and documented LV apical thrombus.  Objective:  Vital Signs in the last 24 hours: Temp:  [97.3 F (36.3 C)-98.4 F (36.9 C)] 98.3 F (36.8 C) (10/13 0733) Pulse Rate:  [73-91] 91 (10/13 0733) Resp:  [15-29] 24 (10/13 1000) BP: (103-139)/(52-90) 111/83 mmHg (10/13 1000) SpO2:  [93 %-98 %] 93 % (10/13 0733)  Intake/Output from previous day: 10/12 0701 - 10/13 0700 In: 516.8 [P.O.:440; I.V.:76.8] Out: 2725 [Urine:2725] Intake/Output from this shift: Total I/O In: -  Out: 875 [Urine:875]  Physical Exam: Neck: no adenopathy, no carotid bruit, no JVD and supple, symmetrical, trachea midline Lungs: clear to auscultation bilaterally Heart: irregularly irregular rhythm, S1, S2 normal and Soft systolic murmur noted no S3 gallop Abdomen: soft, non-tender; bowel sounds normal; no masses,  no organomegaly Extremities: extremities normal, atraumatic, no cyanosis or edema and Right groin stable  Lab Results:  Recent Labs  05/14/14 0308 05/15/14 0244  WBC 8.3 7.3  HGB 12.7* 13.9  PLT 221 244    Recent Labs  05/13/14 0305 05/15/14 0244  NA 138 138  K 3.8 4.0  CL 105 105  CO2 18* 20  GLUCOSE 134* 115*  BUN 10 8  CREATININE 0.85 0.95    Recent Labs  05/13/14 0305 05/15/14 0244  TROPONINI 1.66* 1.25*   Hepatic Function Panel  Recent Labs  05/12/14 2212  PROT 5.7*  ALBUMIN 2.6*  AST 12  ALT 13   ALKPHOS 56  BILITOT 0.3    Recent Labs  05/14/14 0308  CHOL 126   No results found for this basename: PROTIME,  in the last 72 hours  Imaging: Imaging results have been reviewed and [name] Wo Contrast  05/13/2014   ADDENDUM REPORT: 05/13/2014 14:36  ADDENDUM: Study discussed by telephone with Dr. Lucia Gaskins of the Stroke Service on 05/13/2014 at 14:34 .   Electronically Signed   By: Augusto Gamble M.D.   On: 05/13/2014 14:36   05/13/2014   CLINICAL DATA:  63 year old male with acute left side weakness and difficulty speaking. Initial encounter. Reported cardiac thrombus on ECHO.  EXAM: MRI HEAD WITHOUT CONTRAST  MRA HEAD WITHOUT CONTRAST  TECHNIQUE: Multiplanar, multiecho pulse sequences of the brain and surrounding structures were obtained without intravenous contrast. Angiographic images of the head were obtained using MRA technique without contrast.  COMPARISON:  Head CT without contrast 05/12/2014.  FINDINGS: MRI HEAD FINDINGS  Scattered and sometimes confluent foci of restricted diffusion in the right hemisphere. Right basal ganglia and external capsule affected. Scattered involvement of the right temporal lobe, mostly the superior temporal gyrus. Scattered involvement of the frontal operculum. Some involvement of the anterior and lateral right occipital lobe.  No contralateral left hemisphere or posterior fossa restricted diffusion. Major intracranial vascular flow voids are preserved, however, there is asymmetric increased signal in the right MCA posterior sylvian division (series 8, image 18). See MRA findings below.  Mild T2 and FLAIR hyperintensity at the areas of restricted diffusion. Additional scattered nonspecific  cerebral white matter T2 and FLAIR hyperintensity. No midline shift, mass effect, or evidence of intracranial mass lesion. No acute intracranial hemorrhage identified. No cortical encephalomalacia. Brainstem and cerebellum within normal limits. No ventriculomegaly.  Visible  internal auditory structures appear normal. Small volume retained secretions in the nasopharynx. Minor paranasal sinus mucosal thickening. Visualized orbit soft tissues are within normal limits. Negative pituitary, cervicomedullary junction visualized cervical spine. Normal bone marrow signal. Visualized scalp soft tissues are within normal limits.  MRA HEAD FINDINGS  Antegrade flow in the posterior circulation with dominant distal left vertebral artery. Normal right PICA origin. Patent vertebrobasilar junction. No basilar artery stenosis. Dominant left AICA. SCA and left PCA origins are within normal limits. Fetal type right PCA origin. Mild left PCA P2 segment irregularity and stenosis, otherwise visualized bilateral PCA branches are within normal limits.  Antegrade flow in both ICA siphons, symmetric. No siphon stenosis. Ophthalmic and right posterior communicating artery origins are within normal limits. Patent carotid termini. Normal MCA and ACA origins. Anterior communicating artery and visualized bilateral ACA branches are within normal limits. Left MCA branches are within normal limits.  The right MCA M1 segment is subtotally occluded 10 mm beyond its origin with poor distal right MCA bifurcation and M2 branch flow signal (series 607, image 12).  IMPRESSION: 1. 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 2. Scattered small acute right MCA territory infarcts. No mass effect or hemorrhage.  Electronically Signed: By: Augusto GambleLee  Hall M.D. On: 05/13/2014 14:08   Mr Brain Wo Contrast  05/13/2014   ADDENDUM REPORT: 05/13/2014 14:36  ADDENDUM: Study discussed by telephone with [name] of the Stroke Service on 05/13/2014 at 14:34 .   Electronically Signed   By: Augusto GambleLee  Hall M.D.   On: 05/13/2014 14:36   05/13/2014   CLINICAL DATA:  63 year old male with acute left side weakness and difficulty speaking. Initial encounter. Reported cardiac thrombus on ECHO.  EXAM: MRI HEAD WITHOUT  CONTRAST  MRA HEAD WITHOUT CONTRAST  TECHNIQUE: Multiplanar, multiecho pulse sequences of the brain and surrounding structures were obtained without intravenous contrast. Angiographic images of the head were obtained using MRA technique without contrast.  COMPARISON:  Head CT without contrast 05/12/2014.  FINDINGS: MRI HEAD FINDINGS  Scattered and sometimes confluent foci of restricted diffusion in the right hemisphere. Right basal ganglia and external capsule affected. Scattered involvement of the right temporal lobe, mostly the superior temporal gyrus. Scattered involvement of the frontal operculum. Some involvement of the anterior and lateral right occipital lobe.  No contralateral left hemisphere or posterior fossa restricted diffusion. Major intracranial vascular flow voids are preserved, however, there is asymmetric increased signal in the right MCA posterior sylvian division (series 8, image 18). See MRA findings below.  Mild T2 and FLAIR hyperintensity at the areas of restricted diffusion. Additional scattered nonspecific cerebral white matter T2 and FLAIR hyperintensity. No midline shift, mass effect, or evidence of intracranial mass lesion. No acute intracranial hemorrhage identified. No cortical encephalomalacia. Brainstem and cerebellum within normal limits. No ventriculomegaly.  Visible internal auditory structures appear normal. Small volume retained secretions in the nasopharynx. Minor paranasal sinus mucosal thickening. Visualized orbit soft tissues are within normal limits. Negative pituitary, cervicomedullary junction visualized cervical spine. Normal bone marrow signal. Visualized scalp soft tissues are within normal limits.  MRA HEAD FINDINGS  Antegrade flow in the posterior circulation with dominant distal left vertebral artery. Normal right PICA origin. Patent vertebrobasilar junction. No basilar artery stenosis. Dominant left AICA.  SCA and left PCA origins are within normal limits. Fetal type  right PCA origin. Mild left PCA P2 segment irregularity and stenosis, otherwise visualized bilateral PCA branches are within normal limits.  Antegrade flow in both ICA siphons, symmetric. No siphon stenosis. Ophthalmic and right posterior communicating artery origins are within normal limits. Patent carotid termini. Normal MCA and ACA origins. Anterior communicating artery and visualized bilateral ACA branches are within normal limits. Left MCA branches are within normal limits.  The right MCA M1 segment is subtotally occluded 10 mm beyond its origin with poor distal right MCA bifurcation and M2 branch flow signal (series 607, image 12).  IMPRESSION: 1. 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 2. Scattered small acute right MCA territory infarcts. No mass effect or hemorrhage.  Electronically Signed: By: Augusto GambleLee  Hall M.D. On: 05/13/2014 14:08    Cardiac Studies:  Assessment/Plan:  Status post recent large anterolateral wall myocardial infarction with apical LV thrombus status post emergency PCI to mid and distal LAD with excellent results  Status post cardioembolic right CVA with left paresis improved prior to PCI.  2 vessel coronary artery disease  Recurrent  A. fib flutter now converted back to sinus rhythm  Hypercholesteremia  Positive family history of coronary artery disease  Plan Increase amiodarone dose as per orders   LOS: 3 days    Maryana Pittmon N 05/15/2014, 11:30 AM

## 2014-05-15 NOTE — Progress Notes (Addendum)
CARDIAC REHAB PHASE I   PRE:  Rate/Rhythm: 85 Afib 78 SR  BP:  Supine: 119/78  Sitting:   Standing:    SaO2:   MODE:  Ambulation: 700 ft   POST:  Rate/Rhythm: 84 Afib  BP:  Supine:   Sitting: 121/85  Standing:     SaO2:   1400-1500 Pt tolerated ambulation well without c/o of cp or SOB. Pt's heart rhythm is changing frequently from SR to A fib. Pt's gait steady. VS stable Pt to recliner after walk with call light in reach.Pt did hit trash can which was on his left side at the end of the bed on return from walk.Completetd more education with pt. We discussed heart healthy diet, exercise guidelines, proper use of sl NTG and Outpt. CRP. He voices understanding. Pt was able to recall education well from what we discussed yesterday.He is going to discuss Outp tCRP with his wife.Placed MI video for pt to watch. Will follow pt tomorrow.  Melina CopaLisa Greysyn Vanderberg RN 05/15/2014 2:49 PM

## 2014-05-15 NOTE — Progress Notes (Addendum)
STROKE TEAM PROGRESS NOTE   HISTORY Gregory Shields is an 63 y.o. male with no significant past medical history came to the ER by EMS as code STEMI was called and underwent left cardiac cath/PTCA and stenting earlier today.  Pt's wife indicated that she first noticed his change in speech and Lt side weakness at 1330 today but was unable to persuade him to go to the hospital.  He returned from cath lab and was noted to have weakness involving left arm and leg.  Denies HA, vertigo, double vision, difficulty swallowing, slurred speech, language or vision impairment.  CT brain revealed no acute intracranial abnormality.  Date last known well: 05/12/14  Time last known well: 1:30 pm  tPA Given: no, out of the window  NIHSS: 2  SUBJECTIVE (INTERVAL HISTORY) His wife is at the bedside. No acute changes overnight. His heparin drip discontinued and he was put on eliquis for afib and LV thrombus. Still on ASA and brilinta.  OBJECTIVE Temp:  [97.3 F (36.3 C)-98.4 F (36.9 C)] 97.4 F (36.3 C) (10/13 1201) Pulse Rate:  [73-91] 90 (10/13 1201) Cardiac Rhythm:  [-] Atrial fibrillation (10/13 0800) Resp:  [18-29] 24 (10/13 1000) BP: (103-137)/(52-89) 127/89 mmHg (10/13 1201) SpO2:  [93 %-97 %] 95 % (10/13 1201)   Recent Labs Lab 05/15/14 0013 05/15/14 0427  GLUCAP 112* 86    Recent Labs Lab 05/12/14 1714 05/12/14 2212 05/13/14 0305 05/15/14 0244  NA 137 135* 138 138  K 3.8 4.0 3.8 4.0  CL 100 102 105 105  CO2 24 21 18* 20  GLUCOSE 115* 99 134* 115*  BUN 13 12 10 8   CREATININE 0.92 0.84 0.85 0.95  CALCIUM 8.6 7.9* 7.9* 8.2*  MG  --  2.1  --   --     Recent Labs Lab 05/12/14 1714 05/12/14 2212  AST 15 12  ALT 15 13  ALKPHOS 60 56  BILITOT 0.3 0.3  PROT 6.2 5.7*  ALBUMIN 2.9* 2.6*    Recent Labs Lab 05/12/14 1714 05/12/14 2212 05/13/14 0305 05/14/14 0308 05/15/14 0244  WBC 7.1 6.3 7.9 8.3 7.3  NEUTROABS  --  3.8  --   --   --   HGB 13.5 12.7* 13.9 12.7* 13.9  HCT  37.5* 35.1* 39.2 36.2* 39.4  MCV 88.9 88.2 90.3 89.2 88.3  PLT 210 211 209 221 244    Recent Labs Lab 05/12/14 2212 05/13/14 0305 05/15/14 0244  TROPONINI 2.41* 1.66* 1.25*    Recent Labs  05/12/14 1714  LABPROT 17.6*  INR 1.45   No results found for this basename: COLORURINE, APPERANCEUR, LABSPEC, PHURINE, GLUCOSEU, HGBUR, BILIRUBINUR, KETONESUR, PROTEINUR, UROBILINOGEN, NITRITE, LEUKOCYTESUR,  in the last 72 hours     Component Value Date/Time   CHOL 126 05/14/2014 0308   TRIG 90 05/14/2014 0308   HDL 28* 05/14/2014 0308   CHOLHDL 4.5 05/14/2014 0308   VLDL 18 05/14/2014 0308   LDLCALC 80 05/14/2014 0308   Lab Results  Component Value Date   HGBA1C 5.1 05/14/2014   No results found for this basename: labopia,  cocainscrnur,  labbenz,  amphetmu,  thcu,  labbarb    No results found for this basename: ETH,  in the last 168 hours  Ct Head Wo Contrast  05/13/2014   IMPRESSION: 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 This finding may well warrant CT or MR angiography to further assess. The patient has already had a dose  of iodinated contrast earlier today; advise appropriate hydration before repeating dose of iodinated contrast nonemergently.  There is slight periventricular small vessel disease. No intracranial mass, hemorrhage, or acute appearing infarct.      [name] Wo Contrast/MR of the brain 05/13/2014   IMPRESSION: 1. 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 2. Scattered small acute right MCA territory infarcts. No mass effect or hemorrhage.    CUS - 1-39% ICA stenosis. Vertebral artery flow is antegrade.   2D echo - - Left ventricle: The cavity size was normal. Systolic function was mildly reduced. The estimated ejection fraction was in the range of 45% to 50%. There is akinesis of the apicalanteroseptal and apical myocardium. There was a thrombus. There was an  apical thrombus. - Atrial septum: No defect or patent foramen ovale was identified.  LDL 90 and A1C 5.1  PHYSICAL EXAM  Temp:  [97.3 F (36.3 C)-98.4 F (36.9 C)] 97.4 F (36.3 C) (10/13 1201) Pulse Rate:  [73-91] 90 (10/13 1201) Resp:  [18-29] 24 (10/13 1000) BP: (103-137)/(52-89) 127/89 mmHg (10/13 1201) SpO2:  [93 %-97 %] 95 % (10/13 1201)  General - Well nourished, well developed, in no apparent distress.  Ophthalmologic - Sharp disc margins OU.  Cardiovascular - Regular rate and rhythm with no murmur.  Mental Status -  Level of arousal and orientation to time, place, and person were intact. Language including expression, naming, repetition, comprehension was assessed and found intact.  Cranial Nerves II - XII - II - Visual field intact OU. III, IV, VI - Extraocular movements intact. V - Facial sensation intact bilaterally. VII - Facial movement intact bilaterally. VIII - Hearing & vestibular intact bilaterally. X - Palate elevates symmetrically. XI - Chin turning & shoulder shrug intact bilaterally. XII - Tongue protrusion intact.  Motor Strength - The patient's strength was normal in all extremities except left dexterity deficit and pronator drift was present at left.  Bulk was normal and fasciculations were absent.   Motor Tone - Muscle tone was assessed at the neck and appendages and was normal.  Reflexes - The patient's reflexes were normal in all extremities and he had no pathological reflexes.  Sensory - Light touch, temperature/pinprick were assessed and were normal.    Coordination - The patient had normal movements in the hands and feet with no ataxia or dysmetria.  Tremor was absent.  Gait and Station - not tested due to safety concern.  ASSESSMENT/PLAN 63 y.o. male had CP one week before admission and then developed left hemiparesis, found to have acute anterolateral  MI followed by cath/PTCA and stenting. MRA showed subtotal occlusion of the right MCA  M1 segment, poor distal right MCA flow. MRI showed scattered small acute right MCA territory infarcts consistent with embolic pattern. Echo found to have LV thrombus. Also found to have paroxysmal a flutter. Currently pt on ASA, brilinta, and heparin drip.   Stroke - cardioembolic due to LV thrombus and pAflutter/Afib  MRI small acute right MCA territory infarcts   MRA subtotal occlusion of the right MCA M1 segment  Carotid Doppler unremarkable  2D Echo: Echocardiogram showed LV apical thrombus.   Tele found to have paroxysmal aflutter confirmed on EKG  Switched to eliquis today from heparin drip.   Also on ASA and brilinta for drug eluting stent x 2  Pt has higher risk for bleeding including ICH while on eliquis with ASA and brilinta. However, it seems that he needs  all of them for afib and for cardiac stents, and benefits outweigh risks. Discussed with pt and wife, they expressed understanding, and will keep monitoring. If there is acute neuro changes, they will let us know and may consider stat CT to rule out bleeding.  Bleeding precautions - discussed with pt and wife too.  LDL 90, not on the goal (<70)  HgbA1c 5.1  STEMI s/p cath/PTCA and stenting with LV thrombus - paroxysmal A flutter confirmed by EKG - CP resolved.  - on eliquis as well as ASA and brilinta.  - Pt has higher risk for bleeding including ICH while on eliquis with ASA and brilinta. However, it seems that he needs all of them for afib and for cardiac stents, and benefits outweigh risks.  Hypertension  Permissive hypertension <220/120 for 24-48 hours and then gradually normalize within 5-7 days BP goal long term normotensive  Hyperlipidemia - LDL 90 - goal of LDL < 70 - continue lipitor 80mg   Hospital day # 3  Neurology will sign off for now, please call with questions. [facility] months. Thank you for the consult.  Marvel PlanJindong Hong Timm, MD PhD Stroke  Neurology 05/15/2014 1:33 PM    To contact Stroke Continuity provider, please refer to [email or web address]. After hours, contact General Neurology

## 2014-05-15 NOTE — Evaluation (Signed)
Occupational Therapy Evaluation Patient Details Name: Gregory Shields MRN: [id number] DOB: [birth year] Today's Date: 05/15/2014    History of Present Illness Pt adm with STEMI and underwent emergent cath and PCI. Pt also with cardioembolic rt CVA. MRI showed scattered small acute right MCA territory infarcts in the BG, temporal, frontal and occipital lobes (MRI)   Clinical Impression   Instructed pt and wife in pt's deficits and their implications with safety.  Wife stated she is overwhelmed with the care of pt and cannot provide the close supervision he requires nor would the pt likely respond to her guidance. Pt educated in coordination activities with theraputty and addressed L inattention through locating designated items around room and nursing unit. Pt continues to require min to moderate cues for memory and for efficiency. Pt has potential to function at a intermittent supervision to modified independent level with intense rehab.  Will make a CIR referral.    Follow Up Recommendations  CIR;Supervision/Assistance - 24 hour    Equipment Recommendations  None recommended by OT    Recommendations for Other Services Rehab consult     Precautions / Restrictions Precautions Precaution Comments: mild L inattention      Mobility Bed Mobility Overal bed mobility: Modified Independent                Transfers Overall transfer level: Needs assistance Equipment used: None   Sit to Stand: Supervision              Balance                                            ADL Overall ADL's : Needs assistance/impaired     Grooming: Wash/dry hands;Wash/dry face;Oral care;Brushing hair;Minimal assistance;Standing (minimal assist due to cognition/gathering items)                               Functional mobility during ADLs: Supervision/safety General ADL Comments: Placed ADL items around the room for pt to locate given 3 ADL tasks to complete.  Pt  needs multiple verbal cues for recalling items needed to complete ADL.  Walked pt around unit requiring L hand turns and requesting he locate items him he him he him he him in on the L.     Vision                 Additional Comments: L inattentions without awareness   Perception     Praxis      Pertinent Vitals/Pain Pain Assessment: No/denies pain     Hand Dominance     Extremity/Trunk Assessment             Communication     Cognition Arousal/Alertness: Awake/alert Behavior During Therapy: Flat affect Overall Cognitive Status: Impaired/Different from baseline Area of Impairment: Memory;Awareness;Problem solving;Attention   Current Attention Level: Selective Memory: Decreased short-term memory     Awareness: Emergent Problem Solving: Slow processing     General Comments       Exercises       Shoulder Instructions      Home Living                                          Prior  Functioning/Environment               OT Diagnosis:     OT Problem List:     OT Treatment/Interventions:      OT Goals(Current goals can be found in the care plan section) Acute Rehab OT Goals Patient Stated Goal: go back to work  OT Frequency: Min 2X/week   Barriers to D/C:            Co-evaluation              End of Session Nurse Communication:  (discharge needs)  Activity Tolerance: Patient tolerated treatment well Patient left: in bed;with call bell/phone within reach;with family/visitor present   Time: 1044-1200 (many interruptions) OT Time Calculation (min): 76 min Charges:  OT General Charges $OT Visit: 1 Procedure OT Treatments $Self Care/Home Management : 38-52 mins G-Codes:    Gregory Shields, Gregory Shields 05/15/2014, 12:17 PM [phone]

## 2014-05-15 NOTE — Progress Notes (Addendum)
Rehab Admissions Coordinator Note:  Patient was screened by Marielis Samara L for appropriateness for an Inpatient Acute Rehab Consult. I have reviewed pt's case and noted that PT evaluated pt and stated that pt is close to being back at his baseline with mobility. In addition, pt ambulated 550' with supervision to modified independence. OT evaluation documented the need for coordination activities, repeated cues for memory, L innattention and for efficiency.  Pt has [employer] insurance and it is not likely that they would give authorization for acute inpatient rehab based on his high level of functioning (supervision to modified independent with mobility).    At this time, we are recommending pursuing either outpt therapy services or home with home health. I am also not sure if [employer] would give authorization to consider skilled nursing as pt is very high level overall.  Siddiq Kaluzny L 05/15/2014, 2:25 PM  I can be reached at [phone].

## 2014-05-16 MED ORDER — TICAGRELOR 90 MG PO TABS: 90.0000 mg | ORAL_TABLET | Freq: Two times a day (BID) | ORAL | Status: DC

## 2014-05-16 MED ORDER — NITROGLYCERIN 0.4 MG SL SUBL: 0.4000 mg | SUBLINGUAL_TABLET | SUBLINGUAL | Status: DC | PRN

## 2014-05-16 MED ORDER — CARVEDILOL 3.125 MG PO TABS: 3.1250 mg | ORAL_TABLET | Freq: Two times a day (BID) | ORAL | Status: DC

## 2014-05-16 MED ORDER — ATORVASTATIN CALCIUM 80 MG PO TABS: 80.0000 mg | ORAL_TABLET | Freq: Every day | ORAL | Status: DC

## 2014-05-16 MED ORDER — LISINOPRIL 2.5 MG PO TABS: 2.5000 mg | ORAL_TABLET | Freq: Every day | ORAL | Status: DC

## 2014-05-16 MED ORDER — APIXABAN 5 MG PO TABS: 5.0000 mg | ORAL_TABLET | Freq: Two times a day (BID) | ORAL | Status: DC

## 2014-05-16 MED ORDER — ASPIRIN 81 MG PO TBEC: 81.0000 mg | DELAYED_RELEASE_TABLET | Freq: Every day | ORAL | Status: DC

## 2014-05-16 MED ORDER — AMIODARONE HCL 200 MG PO TABS: 200.0000 mg | ORAL_TABLET | Freq: Two times a day (BID) | ORAL | Status: DC

## 2014-05-16 MED ORDER — PANTOPRAZOLE SODIUM 40 MG PO TBEC: 40.0000 mg | DELAYED_RELEASE_TABLET | Freq: Every day | ORAL | Status: DC

## 2014-05-16 NOTE — ED Provider Notes (Signed)
I saw and evaluated the patient, reviewed the resident's note and I agree with the findings and plan.   .Face to face Exam:  General:  Awake HEENT:  Atraumatic Resp:  Normal effort Abd:  Nondistended Neuro:No focal weakness   EKG with was reviewed and discussed with resident   CRITICAL CARE Performed by: Nelva NayBEATON,Mikelle Myrick L Total critical care time: 30 min Critical care time was exclusive of separately billable procedures and treating other patients. Critical care was necessary to treat or prevent imminent or life-threatening deterioration. Critical care was time spent personally by me on the following activities: development of treatment plan with patient and/or surrogate as well as nursing, discussions with consultants, evaluation of patient's response to treatment, examination of patient, obtaining history from patient or surrogate, ordering and performing treatments and interventions, ordering and review of laboratory studies, ordering and review of radiographic studies, pulse oximetry and re-evaluation of patient's condition.   Nelia Shiobert L Deklyn Trachtenberg, MD 05/16/14 [phone]

## 2014-05-16 NOTE — Progress Notes (Signed)
EKG CRITICAL VALUE     12 lead EKG performed.  Critical value noted.  Deretha EmoryMildred Shaw, RN notified.   Ernestina PennaROBERTS, Zhania Shaheen M, CCT 05/16/2014 6:53 AM

## 2014-05-16 NOTE — Discharge Summary (Signed)
[name]:  Ambler, Khan                ACCOUNT NO.:  [id number]  MEDICAL RECORD NO.:  [id number]  LOCATION:  2H13C                        FACILITY:  [facility]  PHYSICIAN:  Lochlan Grygiel N. Sharyn LullHarwani, M.D. DATE OF BIRTH:  03/23/1951  DATE OF ADMISSION:  05/12/2014 DATE OF DISCHARGE:  05/16/2014                              DISCHARGE SUMMARY   ADMITTING DIAGNOSES: 1. Probable recent acute anterolateral wall myocardial infarction. 2. Status post angina with atypical presentation. 3. Positive family history of coronary artery disease.  DISCHARGE DIAGNOSES: 1. Status post recent large anterolateral wall myocardial infarction     complicated with apical LV thrombus status post emergency PCI to     mid and distal LAD with excellent results. 2. Status post cardioembolic right cerebrovascular accident with left     paresis improved, which was prior to percutaneous coronary     intervention two vessel coronary artery disease. 3. Status post recurrent atrial fibrillation flutter now converted     back into sinus rhythm and remains in sinus rhythm. 4. Hypercholesteremia. 5. Positive family history of coronary artery disease.  DISCHARGE HOME MEDICATIONS: 1. Amiodarone 200 mg twice daily. 2. Eliquis 5 mg twice daily. 3. Aspirin 81 mg daily. 4. Atorvastatin 80 mg daily. 5. Carvedilol 3.125 mg twice daily. 6. Lisinopril 2.5 mg daily. 7. Nitrostat sublingual use as directed. 8. Protonix 40 mg daily. 9. Brilinta 90 mg twice daily.  DIET:  Low salt, low cholesterol diet.  ACTIVITY:  Increase activity slowly as tolerated.  Post PTCA stent instructions have been given.  The patient also advised to call 911 if he develops further weakness in the arms, legs, slurred speech, severe headache, or if he notices any bleeding.  FOLLOWUP:  Follow up with me in 1 week.  Follow up with Neurology as scheduled.  CONDITION AT DISCHARGE:  Stable.  BRIEF HISTORY AND HOSPITAL COURSE:  [name] is a 63 year old  male with no significant past medical history except for family history of coronary artery disease.  He came to the ER by EMS as code STEMI was called.  The patient complained of retrosternal chest pain described as pressure last Saturday off and on radiating to the shoulder and arm, did not seek any medical attention.  Today around 1:00 p.m. had weakness in the left arm, did not seek any medical attention.  Again in the evening when he had similar episode of weakness and pain in the left arm decided to call EMS.  The patient denies any chest pain, nausea, vomiting, diaphoresis today.  Denies palpitation, lightheadedness, or syncope. Denies PND, orthopnea, or leg swelling.  The patient very poor historian.  EKG done in the ED showed normal sinus rhythm with Q-waves and ST elevation in lead V1 to V4 and minimal ST elevation in inferior leads and also small Q-waves in lead 1 and aVL suggestive of acute inferolateral wall MI.  PHYSICAL EXAMINATION:  GENERAL:  He was alert, awake, oriented x3. Hemodynamically stable. HEENT:  Conjunctivae was pink. NECK:  Supple.  Sclerae nonicteric.  Positive JVD. LUNGS:  Decreased breath sound at bases, clear anterolaterally. CARDIOVASCULAR:  Regular rate and rhythm.  Soft, systolic murmur, and S3 gallop noted. ABDOMEN:  Soft.  Bowel sounds were present.  Nontender. EXTREMITIES:  There is no clubbing, cyanosis, or edema. NEURO:  He was alert and oriented.  LABORATORY DATA:  Sodium was 137, potassium 3.8, BUN 13, creatinine 0.92, glucose was 115.  Hemoglobin 13.5, hematocrit 37.5, white count of 7.1.  His first set of troponin-I was elevated at 4.50, second set was 2.41, next set was 1.66, last set was 1.25.  Cholesterol was 126, triglycerides were 90, HDL was low at 28, LDL was 80.  Hemoglobin A1c was 5.1.  Repeat fasting sugar was 99.  The patient had CT of the brain which showed questionable 4 mm aneurysm near the M1-M2 junction of the right middle  cerebral artery.  This finding may well warrant CT or MR angiography to further assess.  There was slight periventricular small vessel disease.  No intracranial mass, hemorrhage, or acute appearing infarct.  The patient subsequently underwent MRI and MRA of the brain which showed a subtotal occlusion of right MCA M1 segment for distal right MCA flow, scattered small acute right MCA territory infarct, no mass effect or hemorrhage was noted.  BRIEF HOSPITAL COURSE:  The patient was taken to the cardiac cath lab emergently and underwent PTCA and stenting to 100% occluded LAD with excellent results.  The patient tolerated the procedure well.  There were no complications.  Due to mild weakness in the arms, the patient subsequently underwent a CT of the brain.  Neurology consultation was obtained and the patient underwent MRI and MRA of the brain which showed a right MCA infarct as above.  The patient had also 2D echo done which showed apical thrombus.  The patient was started on heparin postprocedure and was switched to Eliquis as the patient had recurrent episodes of atrial fibrillation.  The patient also was started on amiodarone and was spontaneously converted back into sinus rhythm.  The patient has remained in sinus rhythm for last 24 hours.  OT/PT consultation and Cardiac Rehab consult was obtained.  The patient is ambulating in hallway and in room without any problems.  The patient will be discharged home on above medications.  The patient will be on dual antiplatelet medications and anticoagulants.  Discussed with the patient and his wife slightly higher risk of bleeding and conversion of stroke into hemorrhagic stroke, its risks and benefits, and agrees for above management.  Discussed with Neurology also, they will follow him up as outpatient very closely.  The patient and his wife has been advised if she notices any neuro changes, facial droop, weakness, headaches, slurred speech  should call 911 right away.  The patient will be scheduled for phase II cardiac rehab as an outpatient.    Eduardo OsierMohan N. Sharyn LullHarwani, M.D.    MNH/MEDQ  D:  05/16/2014  T:  05/16/2014  Job:  [id number]

## 2014-05-16 NOTE — Progress Notes (Signed)
[id number] Cardiac Rehab Reviewed discharge education with pt. He voices understanding. Pt agrees to Outpt. CRP in [place], will send referral. Beatrix FettersHughes, Akira Perusse G, RN 05/16/2014 3:15 PM

## 2014-05-16 NOTE — Discharge Instructions (Signed)
Information on my medicine - ELIQUIS (apixaban)  This medication education was reviewed with me or my healthcare representative as part of my discharge preparation.  The pharmacist that spoke with me during my hospital stay was:  Elwin Sleightowell, Thanh Pomerleau Kay, [facility]  Why was Eliquis prescribed for you? Eliquis was prescribed for you to reduce the risk of a blood clot forming that can cause a stroke if you have a medical condition called atrial fibrillation (a type of irregular heartbeat).  What do You need to know about Eliquis ? Take your Eliquis TWICE DAILY - one tablet in the morning and one tablet in the evening with or without food. If you have difficulty swallowing the tablet whole please discuss with your pharmacist how to take the medication safely.  Take Eliquis exactly as prescribed by your doctor and DO NOT stop taking Eliquis without talking to the doctor who prescribed the medication.  Stopping may increase your risk of developing a stroke.  Refill your prescription before you run out.  After discharge, you should have regular check-up appointments with your healthcare provider that is prescribing your Eliquis.  In the future your dose may need to be changed if your kidney function or weight changes by a significant amount or as you get older.  What do you do if you miss a dose? If you miss a dose, take it as soon as you remember on the same day and resume taking twice daily.  Do not take more than one dose of ELIQUIS at the same time to make up a missed dose.  Important Safety Information A possible side effect of Eliquis is bleeding. You should call your healthcare provider right away if you experience any of the following:   Bleeding from an injury or your nose that does not stop.   Unusual colored urine (red or dark brown) or unusual colored stools (red or black).   Unusual bruising for unknown reasons.   A serious fall or if you hit your head (even if there is no bleeding).  Some  medicines may interact with Eliquis and might increase your risk of bleeding or clotting while on Eliquis. To help avoid this, consult your healthcare provider or pharmacist prior to using any new prescription or non-prescription medications, including herbals, vitamins, non-steroidal anti-inflammatory drugs (NSAIDs) and supplements.  This website has more information on Eliquis (apixaban): http://www.eliquis.com/eliquis/home Acute Coronary Syndrome Acute coronary syndrome (ACS) is an urgent problem in which the blood and oxygen supply to the heart is critically deficient. ACS requires hospitalization because one or more coronary arteries may be blocked. ACS represents a range of conditions including:  Previous angina that is now unstable, lasts longer, happens at rest, or is more intense.  A heart attack, with heart muscle cell injury and death. There are three vital coronary arteries that supply the heart muscle with blood and oxygen so that it can pump blood effectively. If blockages to these arteries develop, blood flow to the heart muscle is reduced. If the heart does not get enough blood, angina may occur as the first warning sign. SYMPTOMS   The most common signs of angina include:  Tightness or squeezing in the chest.  Feeling of heaviness on the chest.  Discomfort in the arms, neck, back, or jaw.  Shortness of breath and nausea.  Cold, wet skin.  Angina is usually brought on by physical effort or excitement which increase the oxygen needs of the heart. These states increase the blood flow needs of  the heart beyond what can be delivered.  Other symptoms that are not as common include:  Fatigue  Unexplained feelings of nervousness or anxiety  Weakness  Diarrhea  Sometimes, you may not have noticed any symptoms at all but still suffered a cardiac injury. TREATMENT   Medicines to help discomfort may include nitroglycerin (nitro) in the form of tablets or a spray for  rapid relief, or longer-acting forms such as cream, patches, or capsules. (Be aware that there are many side effects and possible interactions with other drugs).  Other medicines may be used to help the heart pump better.  Procedures to open blocked arteries including angioplasty or stent placement to keep the arteries open.  Open heart surgery may be needed when there are many blockages or they are in critical locations that are best treated with surgery. HOME CARE INSTRUCTIONS   Do not use any tobacco products including cigarettes, chewing tobacco, or electronic cigarettes.  Take one baby or adult aspirin daily, if your health care provider advises. This helps reduce the risk of a heart attack.  It is very important that you follow the angina treatment prescribed by your health care provider. Make arrangements for proper follow-up care.  Eat a heart healthy diet with salt and fat restrictions as advised.  Regular exercise is good for you as long as it does not cause discomfort. Do not begin any new type of exercise until you check with your health care provider.  If you are overweight, you should lose weight.  Try to maintain normal blood lipid levels.  Keep your blood pressure under control as recommended by your health care provider.  You should tell your health care provider right away about any increase in the severity or frequency of your chest discomfort or angina attacks. When you have angina, you should stop what you are doing and sit down. This may bring relief in 3 to 5 minutes. If your health care provider has prescribed nitro, take it as directed.  If your health care provider has given you a follow-up appointment, it is very important to keep that appointment. Not keeping the appointment could result in a chronic or permanent injury, pain, and disability. If there is any problem keeping the appointment, you must call back to this facility for assistance. SEEK IMMEDIATE  MEDICAL CARE IF:   You develop nausea, vomiting, or shortness of breath.  You feel faint, lightheaded, or pass out.  Your chest discomfort gets worse.  You are sweating or experience sudden profound fatigue.  You do not get relief of your chest pain after 3 doses of nitro.  Your discomfort lasts longer than 15 minutes. MAKE SURE YOU:   Understand these instructions.  Will watch your condition.  Will get help right away if you are not doing well or get worse.  Take all medicines as directed by your health care provider. Document Released: 07/20/2005 Document Revised: 07/25/2013 Document Reviewed: 11/21/2013 [facility] Patient Information 2015 [employer] [place]. This information is not intended to replace advice given to you by your health care provider. Make sure you discuss any questions you have with your health care provider.

## 2014-05-16 NOTE — Progress Notes (Signed)
Physical Therapy Treatment Patient Details Name: Gregory Shields MRN: [id number] DOB: 1950/09/29 Today's Date: 05/16/2014    History of Present Illness Pt adm with STEMI and underwent emergent cath and PCI. Pt also with cardioembolic rt CVA. MRI showed scattered small acute right MCA territory infarcts in the BG, temporal, frontal and occipital lobes (MRI)    PT Comments    Pt progressing well towards physical therapy goals. Pt was able to demonstrate attentiveness to L sided obstacles and did not show any unsteadiness with balance activities during gait training. See gait training details for specifics of challenges. Recommending outpatient PT at d/c for continued cognitive and functional training.   Follow Up Recommendations  Outpatient PT     Equipment Recommendations  None recommended by PT    Recommendations for Other Services       Precautions / Restrictions Precautions Precaution Comments: mild L inattention Restrictions Weight Bearing Restrictions: No    Mobility  Bed Mobility Overal bed mobility: Modified Independent             General bed mobility comments: Incr time  Transfers Overall transfer level: Modified independent Equipment used: None Transfers: Sit to/from Stand           General transfer comment: No physical assist required.  Ambulation/Gait Ambulation/Gait assistance: Min guard Ambulation Distance (Feet): 500 Feet Assistive device: None Gait Pattern/deviations: WFL(Within Functional Limits) Gait velocity: WNL Gait velocity interpretation: at or above normal speed for age/gender General Gait Details: Therapist set up obstacle course in hall with objects on the L side that pt needed to avoid. He was able to avoid the abstacles walking in a straight line as well as weaving in and out of the obstacles.    Stairs            Wheelchair Mobility    Modified Rankin (Stroke Patients Only) Modified Rankin (Stroke Patients  Only) Pre-Morbid Rankin Score: No symptoms Modified Rankin: No significant disability     Balance Overall balance assessment: No apparent balance deficits (not formally assessed)                           High level balance activites: Direction changes;Turns;Sudden stops;Head turns High Level Balance Comments: No loss of balance with any of above.    Cognition Arousal/Alertness: Awake/alert Behavior During Therapy: Flat affect Overall Cognitive Status: Impaired/Different from baseline Area of Impairment: Memory;Awareness;Problem solving;Attention   Current Attention Level: Selective Memory: Decreased short-term memory     Awareness: Emergent Problem Solving: Slow processing General Comments: scored 21/30 on the MOCA (montreal cognitive Assessment) demonstrating dificulty with visuospatial skills, executive functions and delayed recall.    Exercises      General Comments        Pertinent Vitals/Pain Pain Assessment: No/denies pain    Home Living                      Prior Function            PT Goals (current goals can now be found in the care plan section) Acute Rehab PT Goals Patient Stated Goal: go back to work PT Goal Formulation: With patient Time For Goal Achievement: 05/18/14 Potential to Achieve Goals: Good Progress towards PT goals: Progressing toward goals    Frequency  Min 3X/week    PT Plan Current plan remains appropriate    Co-evaluation             End  of Session Equipment Utilized During Treatment: Gait belt Activity Tolerance: Patient tolerated treatment well Patient left: in chair;with call bell/phone within reach     Time: 1435-1450 PT Time Calculation (min): 15 min  Charges:  $Gait Training: 8-22 mins                    G Codes:      Gregory Shields, Gregory Shields 05/16/2014, 5:02 PM  Gregory Shields, PT, DPT Acute Rehabilitation Services Pager: [phone]

## 2014-05-16 NOTE — Discharge Summary (Signed)
  Discharge summary dictated on 05/16/2014 dictation number is [phone]

## 2014-05-23 ENCOUNTER — Ambulatory Visit: Payer: BC Managed Care – PPO | Attending: Cardiology | Admitting: Occupational Therapy

## 2014-05-23 DIAGNOSIS — I10 Essential (primary) hypertension: Secondary | ICD-10-CM | POA: Insufficient documentation

## 2014-05-23 DIAGNOSIS — M6281 Muscle weakness (generalized): Secondary | ICD-10-CM | POA: Insufficient documentation

## 2014-05-23 DIAGNOSIS — R279 Unspecified lack of coordination: Secondary | ICD-10-CM | POA: Insufficient documentation

## 2014-05-23 DIAGNOSIS — Z8673 Personal history of transient ischemic attack (TIA), and cerebral infarction without residual deficits: Secondary | ICD-10-CM | POA: Diagnosis not present

## 2014-05-23 DIAGNOSIS — I252 Old myocardial infarction: Secondary | ICD-10-CM | POA: Diagnosis not present

## 2014-05-23 DIAGNOSIS — R419 Unspecified symptoms and signs involving cognitive functions and awareness: Secondary | ICD-10-CM | POA: Diagnosis not present

## 2014-05-23 DIAGNOSIS — R41842 Visuospatial deficit: Secondary | ICD-10-CM | POA: Diagnosis not present

## 2014-05-23 DIAGNOSIS — Z5189 Encounter for other specified aftercare: Secondary | ICD-10-CM | POA: Insufficient documentation

## 2014-05-29 ENCOUNTER — Ambulatory Visit: Payer: BC Managed Care – PPO | Admitting: Occupational Therapy

## 2014-05-29 DIAGNOSIS — Z5189 Encounter for other specified aftercare: Secondary | ICD-10-CM | POA: Diagnosis not present

## 2014-05-31 ENCOUNTER — Ambulatory Visit: Payer: BC Managed Care – PPO | Admitting: Occupational Therapy

## 2014-05-31 DIAGNOSIS — Z5189 Encounter for other specified aftercare: Secondary | ICD-10-CM | POA: Diagnosis not present

## 2014-06-04 ENCOUNTER — Encounter: Payer: BC Managed Care – PPO | Admitting: *Deleted

## 2014-06-05 ENCOUNTER — Ambulatory Visit: Payer: BC Managed Care – PPO | Attending: Cardiology | Admitting: Occupational Therapy

## 2014-06-05 DIAGNOSIS — Z8673 Personal history of transient ischemic attack (TIA), and cerebral infarction without residual deficits: Secondary | ICD-10-CM | POA: Insufficient documentation

## 2014-06-05 DIAGNOSIS — M6281 Muscle weakness (generalized): Secondary | ICD-10-CM | POA: Diagnosis not present

## 2014-06-05 DIAGNOSIS — R279 Unspecified lack of coordination: Secondary | ICD-10-CM | POA: Insufficient documentation

## 2014-06-05 DIAGNOSIS — I639 Cerebral infarction, unspecified: Secondary | ICD-10-CM

## 2014-06-05 DIAGNOSIS — I10 Essential (primary) hypertension: Secondary | ICD-10-CM | POA: Insufficient documentation

## 2014-06-05 DIAGNOSIS — R41842 Visuospatial deficit: Secondary | ICD-10-CM | POA: Insufficient documentation

## 2014-06-05 DIAGNOSIS — Z5189 Encounter for other specified aftercare: Secondary | ICD-10-CM | POA: Insufficient documentation

## 2014-06-05 DIAGNOSIS — R419 Unspecified symptoms and signs involving cognitive functions and awareness: Secondary | ICD-10-CM | POA: Insufficient documentation

## 2014-06-05 DIAGNOSIS — I252 Old myocardial infarction: Secondary | ICD-10-CM | POA: Diagnosis not present

## 2014-06-05 NOTE — Therapy (Signed)
  Patient Details  Name: Mitchel HonourMark Hankey MRN: [id number] Date of Birth: Sep 05, 1950        Occupational Therapy Treatment Note Encounter Date: 06/05/2014        Pt was seen for individual OT treatment session today with focus on cognitive retraining and LUE functional use/strengthening. Pt performed Time management, reading and oranizing a schedule with increased time for task and was able to self correct 1 mistake (x4610min). Reviewed HEP for coordination activities verbally with pt today. Pt performed UBE for UB strengthening and conditioning x8 min at level 5 rotating forward and back.  Pt performed LUE strengthening ex's with red theraband for forward shoulder flexion, horizontal ABD, shoulder extension, external rotation and abduction exercises. Pt performed each exercise x10 reps with occasional vc's for proper positioning and body mechanics (exercise total time 30 min).  See plan for next treatment visit (cognitive retraining and coordination LUE).  Roselie AwkwardBarnhill, Amy Beth Dixon 06/05/2014, 10:41 AM

## 2014-06-05 NOTE — Patient Instructions (Signed)
Reviewed HEP and educated pt and his wife to f/u w/ MD re: reports of feeling depressed and with difficulty sleeping. Pt's wife indicated that she has contacted his MD already.

## 2014-06-07 ENCOUNTER — Encounter: Payer: Self-pay | Admitting: Occupational Therapy

## 2014-06-07 ENCOUNTER — Encounter (HOSPITAL_COMMUNITY)
Admission: RE | Admit: 2014-06-07 | Discharge: 2014-06-07 | Disposition: A | Discharge status: Home / Self Care | Payer: BC Managed Care – PPO | Source: Ambulatory Visit | Attending: Cardiology | Admitting: Cardiology

## 2014-06-07 ENCOUNTER — Ambulatory Visit: Payer: BC Managed Care – PPO | Admitting: Occupational Therapy

## 2014-06-07 DIAGNOSIS — I4891 Unspecified atrial fibrillation: Secondary | ICD-10-CM | POA: Insufficient documentation

## 2014-06-07 DIAGNOSIS — Z5189 Encounter for other specified aftercare: Secondary | ICD-10-CM | POA: Insufficient documentation

## 2014-06-07 DIAGNOSIS — Z8249 Family history of ischemic heart disease and other diseases of the circulatory system: Secondary | ICD-10-CM | POA: Insufficient documentation

## 2014-06-07 DIAGNOSIS — I69352 Hemiplegia and hemiparesis following cerebral infarction affecting left dominant side: Secondary | ICD-10-CM | POA: Insufficient documentation

## 2014-06-07 DIAGNOSIS — I639 Cerebral infarction, unspecified: Secondary | ICD-10-CM

## 2014-06-07 DIAGNOSIS — I10 Essential (primary) hypertension: Secondary | ICD-10-CM | POA: Insufficient documentation

## 2014-06-07 DIAGNOSIS — E780 Pure hypercholesterolemia: Secondary | ICD-10-CM | POA: Insufficient documentation

## 2014-06-07 DIAGNOSIS — I2510 Atherosclerotic heart disease of native coronary artery without angina pectoris: Secondary | ICD-10-CM | POA: Insufficient documentation

## 2014-06-07 DIAGNOSIS — I252 Old myocardial infarction: Secondary | ICD-10-CM | POA: Insufficient documentation

## 2014-06-07 DIAGNOSIS — E785 Hyperlipidemia, unspecified: Secondary | ICD-10-CM | POA: Insufficient documentation

## 2014-06-07 DIAGNOSIS — Z955 Presence of coronary angioplasty implant and graft: Secondary | ICD-10-CM | POA: Insufficient documentation

## 2014-06-07 DIAGNOSIS — I4892 Unspecified atrial flutter: Secondary | ICD-10-CM | POA: Insufficient documentation

## 2014-06-07 NOTE — Therapy (Signed)
Occupational Therapy Treatment  Patient Details  Name: Gregory Shields Main MRN: [id number] Date of Birth: 1951-01-02  Encounter Date: 06/07/2014      OT End of Session - 06/07/14 1021    Visit Number 5   Date for OT Re-Evaluation 06/23/14   OT Start Time 0925   OT Stop Time 1012   OT Time Calculation (min) 47 min   Equipment Utilized During Treatment Visual scanning tools in clinic.   Activity Tolerance Patient tolerated treatment well      Past Medical History  Diagnosis Date  . Depression   . Sleep apnea     No past surgical history on file.  There were no vitals taken for this visit.  Visit Diagnosis:  CVA (cerebral vascular accident)          OT Treatments/Exercises (OP) - 06/07/14 [id number]    Fine Motor Coordination   Fine Motor Coordination In hand manipuation training;Small Pegboard  In hand manipulatng 5-10 pegs at a time    Small Pegboard --  Copying multiple patterns of varying difficulty. Sit/stand   Visual/Perceptual Exercises   Copy this Image Pegboard  Pt reproducing sm pegboard pattern using L hand   Scanning - Environmental --  Scaning Lt/Rt while walking, talking w/ mod difficulty >to R    Pt completed small pegboard design tasks x3 with increasing difficulty (one at tabletop, followed by two in vertical position on rolling mirror, patterns were placed to his left and then to his right). He required increased time to complete and had multiple mistakes on all copying/visual scanning tasks, however he self corrected 95% of the time, only requiring verbal cues twice.  Pt was noted to have increased difficulty scanning while walking/tossing a ball in one hand (to R > L in minimally-moderately distracting environment).      Education - 06/07/14 1021    Education provided Yes   Education Details Patient   Methods Explanation;Demonstration   Comprehension Verbalized understanding;Need further instruction              Plan - 06/07/14 1022    Clinical Impression Statement Pt reports decreased pain in left UE/neck today, actually reporting 0/10 during his visit. Pt is s/p CVA/MI and presented for treatment session.   Pt will benefit from skilled therapeutic intervention in order to improve on the following deficits Cardiopulmonary status limiting activity;Decreased coordination;Decreased strength;Impaired vision/preception;Impaired UE functional use;Pain   Rehab Potential Good   OT Plan Cognitive retraining and environmental scanning activities.        Problem List Patient Active Problem List   Diagnosis Date Noted  . Cerebral embolism with cerebral infarction 05/13/2014  . Acute anterolateral wall MI 05/12/2014                                            Mariam DollarBarnhill, Brieanne Mignone [facility] Center[facility] Dixon 06/07/2014, 10:37 AM

## 2014-06-07 NOTE — Patient Instructions (Signed)
Pt was educated to continue with left handed typing at home and coordination activities per his reporting that he has noticed typing errors when on the computer & doing e-mail etc. Pt performed visual scanning activities in clinic today and was educated that he can perform similar activities at home as well. Pt verbalized understanding of this.

## 2014-06-11 ENCOUNTER — Ambulatory Visit: Payer: BC Managed Care – PPO | Admitting: Occupational Therapy

## 2014-06-11 ENCOUNTER — Telehealth: Payer: Self-pay | Admitting: Occupational Therapy

## 2014-06-11 ENCOUNTER — Encounter (HOSPITAL_COMMUNITY)
Admission: RE | Admit: 2014-06-11 | Discharge: 2014-06-11 | Disposition: A | Discharge status: Home / Self Care | Payer: BC Managed Care – PPO | Source: Ambulatory Visit | Attending: Cardiology | Admitting: Cardiology

## 2014-06-11 ENCOUNTER — Encounter (HOSPITAL_COMMUNITY): Payer: Self-pay

## 2014-06-11 DIAGNOSIS — Z8249 Family history of ischemic heart disease and other diseases of the circulatory system: Secondary | ICD-10-CM | POA: Diagnosis not present

## 2014-06-11 DIAGNOSIS — I4892 Unspecified atrial flutter: Secondary | ICD-10-CM | POA: Diagnosis not present

## 2014-06-11 DIAGNOSIS — E785 Hyperlipidemia, unspecified: Secondary | ICD-10-CM | POA: Diagnosis not present

## 2014-06-11 DIAGNOSIS — Z5189 Encounter for other specified aftercare: Secondary | ICD-10-CM | POA: Diagnosis present

## 2014-06-11 DIAGNOSIS — E780 Pure hypercholesterolemia: Secondary | ICD-10-CM | POA: Diagnosis not present

## 2014-06-11 DIAGNOSIS — Z955 Presence of coronary angioplasty implant and graft: Secondary | ICD-10-CM | POA: Diagnosis not present

## 2014-06-11 DIAGNOSIS — I69352 Hemiplegia and hemiparesis following cerebral infarction affecting left dominant side: Secondary | ICD-10-CM | POA: Diagnosis not present

## 2014-06-11 DIAGNOSIS — I2510 Atherosclerotic heart disease of native coronary artery without angina pectoris: Secondary | ICD-10-CM | POA: Diagnosis not present

## 2014-06-11 DIAGNOSIS — I10 Essential (primary) hypertension: Secondary | ICD-10-CM | POA: Diagnosis not present

## 2014-06-11 DIAGNOSIS — I4891 Unspecified atrial fibrillation: Secondary | ICD-10-CM | POA: Diagnosis not present

## 2014-06-11 DIAGNOSIS — I252 Old myocardial infarction: Secondary | ICD-10-CM | POA: Diagnosis not present

## 2014-06-11 NOTE — Progress Notes (Signed)
Pt started cardiac rehab today.  Pt tolerated light exercise without difficulty.  VSS, telemetry-sinus rhythm, t wave inversion. Asymptomatic.  Dr. Sharyn LullHarwani made aware, no new orders received, ok to participate in cardiac rehab. PHQ-5.  Pt is struggling with physical changes with resulting lifestyle changes from  declining function.  Pt feels like he is burden to others.  Pt will be referred to Theda BelfastBob Hamilton, hospital chaplain for counseling.  Pt offered emotional support and reassurance.  Pt verbalizes effective coping skills, positive outlook with good family support.  Pt is looking forward to clearance to drive and return to work.  Pt rehab goals are to lose weight, improve heart health and improved physical condition.  Pt oriented to exercise equipment and routine.  Understanding verbalized.

## 2014-06-13 ENCOUNTER — Encounter (HOSPITAL_COMMUNITY)
Admission: RE | Admit: 2014-06-13 | Discharge: 2014-06-13 | Disposition: A | Discharge status: Home / Self Care | Payer: BC Managed Care – PPO | Source: Ambulatory Visit | Attending: Cardiology | Admitting: Cardiology

## 2014-06-13 DIAGNOSIS — Z5189 Encounter for other specified aftercare: Secondary | ICD-10-CM | POA: Diagnosis not present

## 2014-06-14 ENCOUNTER — Ambulatory Visit: Payer: BC Managed Care – PPO | Admitting: Occupational Therapy

## 2014-06-14 ENCOUNTER — Encounter: Payer: Self-pay | Admitting: Occupational Therapy

## 2014-06-14 DIAGNOSIS — I639 Cerebral infarction, unspecified: Secondary | ICD-10-CM

## 2014-06-14 DIAGNOSIS — Z5189 Encounter for other specified aftercare: Secondary | ICD-10-CM | POA: Diagnosis not present

## 2014-06-14 NOTE — Therapy (Signed)
Occupational Therapy Treatment  Patient Details  Name: Gregory Shields MRN: [id number] Date of Birth: 12-11-1950  Encounter Date: 06/14/2014      OT End of Session - 06/14/14 0943    Visit Number 6   Date for OT Re-Evaluation 06/23/14   OT Start Time (ACUTE ONLY) 0847   OT Stop Time (ACUTE ONLY) 0930   OT Time Calculation (min) 43 min   Activity Tolerance Patient tolerated treatment well   Behavior During Therapy [facility] for tasks assessed/performed      Past Medical History  Diagnosis Date  . Depression   . Sleep apnea     No past surgical history on file.  There were no vitals taken for this visit.  Visit Diagnosis:  CVA (cerebral vascular accident)      Subjective Assessment - 06/14/14 0912    Symptoms Pt denies pain left shoulder and neck today. He reports that he occasionally has pain in this area at night when attempting to sleep on his left side.   Currently in Pain? No/denies   Pain Score 0-No pain            OT Treatments/Exercises (OP) - 06/14/14 0847    Exercises   Exercises Shoulder;Hand  UBE x65mn level 3 bilat UE strengthening. Hand AROM/coord.   Hand Exercises   Fine Motor Coordination In hand manipuation training;Small Pegboard  L hand placing sm pegs and removing pegs 5 at a time   Other Hand Exercises --  In hand manipulation, placing 1 out 5 pegs into container    Visual/Perceptual Exercises   Scanning Environmental   Scanning - Environmental --  Tossing ball L hand & scanning environment   Other Exercises --  Scaning while doing sm pegboard, patterns in various planes    Pt was instructed to perform visual scanning activities at home (have spouse place cards on wall, high and low) and pt scan while performing another activity like tossing a ball in left hand to challenge senses and cognition in preparation for driving. Pt performed this in clinic and verbalized understanding of doing this at home as well.  Pt instructed in coordination  activities to perform at home including handwriting and signing his name. Focus on letter sizing, spacing, staying on the line etc per pt reports of difficulty in this area.  Pt performed small pegboard activities in clinic to challenge vision, visual scanning, fine motor coordination etc. Patterns were places to low left and pegboard above eye level in middle of visual field. Second activity, pattern was placed upper right side and pegboard just below eye level to slight right of visual field. This encouraged pt to scan pattern R - L and up/down in various planes vertically in standing. Pt coordination was challenged as he placed pegs w/ left hand and removed 5 at a time and released only one into container at a time. Pt was noted to have minimal difficulty with this activity. 1 vc for missing pegs, and dropped pegs x3 (2 of which were noted to fall out when pt bumped board with hand).   Pt has met 3/4 STG's, cont to assess.      OT Education - 06/14/14 [id number]   Education provided Yes   Person(s) Educated Patient   Methods Explanation;Demonstration   Comprehension Verbalized understanding          OT Short Term Goals - 06/14/14 0948    OT SHORT TERM GOAL #1   Title Pt will be independent with HEP  for coordination and LUE proximal strength   Status Achieved   OT SHORT TERM GOAL #2   Title Pt will be able to perform environmental scanning with at least 75% accuracy for increased safety.   Status Achieved   OT SHORT TERM GOAL #4   Title Pt will demonstrate sustained/alternating attention for at least 15 min in moderately distracting environment   Status Achieved            Plan - 06/14/14 0950    Plan Reassess coordination, HEP and complex problem solving tasks begin d/c planning based on results.        Problem List Patient Active Problem List   Diagnosis Date Noted  . Cerebral embolism with cerebral infarction 05/13/2014  . Acute anterolateral wall MI 05/12/2014                                               [name] 06/14/2014, 9:53 AM

## 2014-06-14 NOTE — Patient Instructions (Addendum)
Pt was instructed to perform visual scanning activities at home (have spouse place cards on wall, high and low) and pt scan while performing another activity like tossing a ball in left hand to challenge senses and cognition in preparation for driving. Pt performed this in clinic and verbalized understanding of doing this at home as well.  Pt instructed in coordination activities to perform at home including handwriting and signing his name. Focus on letter sizing, spacing, staying on the line etc per pt reports of difficulty in this area.  Pt has met  3/4 STG's Cont to assess next visit.

## 2014-06-15 ENCOUNTER — Encounter (HOSPITAL_COMMUNITY)
Admission: RE | Admit: 2014-06-15 | Discharge: 2014-06-15 | Disposition: A | Discharge status: Home / Self Care | Payer: BC Managed Care – PPO | Source: Ambulatory Visit | Attending: Cardiology | Admitting: Cardiology

## 2014-06-15 DIAGNOSIS — Z5189 Encounter for other specified aftercare: Secondary | ICD-10-CM | POA: Diagnosis not present

## 2014-06-18 ENCOUNTER — Ambulatory Visit (INDEPENDENT_AMBULATORY_CARE_PROVIDER_SITE_OTHER): Payer: BC Managed Care – PPO | Admitting: Neurology

## 2014-06-18 ENCOUNTER — Encounter (HOSPITAL_COMMUNITY)
Admission: RE | Admit: 2014-06-18 | Discharge: 2014-06-18 | Disposition: A | Discharge status: Home / Self Care | Payer: BC Managed Care – PPO | Source: Ambulatory Visit | Attending: Cardiology | Admitting: Cardiology

## 2014-06-18 ENCOUNTER — Encounter: Payer: Self-pay | Admitting: Neurology

## 2014-06-18 VITALS — BP 114/68 | HR 54 | Ht 67.5 in | Wt 169.2 lb

## 2014-06-18 DIAGNOSIS — IMO0002 Reserved for concepts with insufficient information to code with codable children: Secondary | ICD-10-CM | POA: Insufficient documentation

## 2014-06-18 DIAGNOSIS — I4892 Unspecified atrial flutter: Secondary | ICD-10-CM | POA: Insufficient documentation

## 2014-06-18 DIAGNOSIS — Z5189 Encounter for other specified aftercare: Secondary | ICD-10-CM | POA: Diagnosis not present

## 2014-06-18 DIAGNOSIS — I252 Old myocardial infarction: Secondary | ICD-10-CM

## 2014-06-18 DIAGNOSIS — I63411 Cerebral infarction due to embolism of right middle cerebral artery: Secondary | ICD-10-CM | POA: Insufficient documentation

## 2014-06-18 DIAGNOSIS — I213 ST elevation (STEMI) myocardial infarction of unspecified site: Secondary | ICD-10-CM

## 2014-06-18 DIAGNOSIS — I513 Intracardiac thrombosis, not elsewhere classified: Secondary | ICD-10-CM | POA: Insufficient documentation

## 2014-06-18 NOTE — Progress Notes (Signed)
PSYCHOSOCIAL ASSESSMENT  Pt psychosocial assessment reveals no barriers to rehab participation.  Pt quality of life is slightly altered by his physical constraints which limits hsi ability to perform tasks as prior to his illness. 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   Pt exhibits positive coping skills and has supportive family.  Offered emotional support and reassurance.  Will continue to monitor.

## 2014-06-18 NOTE — Patient Instructions (Addendum)
-   continue ASA and brilinta and eliquis for stroke and cardiac prevention - watch for any sign of bleeding - if there is any neuro changes, please call 911 or go to the nearest ER. - follow up with [facility] next Monday for use of dural antiplatelets and eliquis - continue check BP at cardio rehab and recommend to have home BP monitor device - Follow up with your primary care physician for stroke risk factor modification. Recommend maintain blood pressure goal <130/80, diabetes with hemoglobin A1c goal below 6.5% and lipids with LDL cholesterol goal below 70 mg/dL.  - follow up in 3 months

## 2014-06-18 NOTE — Progress Notes (Signed)
STROKE NEUROLOGY FOLLOW UP NOTE  NAME: Gregory Shields DOB: 07/07/1951  REASON FOR VISIT: stroke follow up HISTORY FROM: pt and chart  Today we had the pleasure of seeing Gregory Shields in follow-up at our Neurology Clinic. Pt was accompanied by wife.   History Summary 63 y.o. male with no significant PMH was admitted on 05/12/14. He had CP one week before admission but did not see for medical attention. He developed left hemiparesis before admission and was found to have acute anterolateral MI. He had cath/PTCA and stenting. MRI showed scattered small acute right MCA territory infarcts consistent with embolic pattern. MRA showed subtotal occlusion of the right MCA M1 segment, poor distal right MCA flow. Echo also found to have LV thrombus. During hospitalization he was found to have paroxysmal a flutter. He was put on ASA, brilinta, and heparin drip which was then switched to eliquis on discharge.   Interval History During the interval time, the patient has been doing well. No neuro deficit. He continued on ASA, brilinta and eliquis without s/s of bleeding. His BP is well controlled and today in clinic is 114/68. He is on cardio rehab 3 times a week. Denies any CP, SOB, weakness or numbness. He will follow up with cardiologist Dr. Marni Griffon next Monday.  REVIEW OF SYSTEMS: Full 14 system review of systems performed and notable only for those listed below and in HPI above, all others are negative:  Constitutional: N/A  Cardiovascular: N/A  Ear/Nose/Throat: N/A  Skin: N/A  Eyes: N/A  Respiratory: N/A  Gastroitestinal: N/A  Genitourinary: N/A Hematology/Lymphatic: N/A  Endocrine: N/A  Musculoskeletal: N/A  Allergy/Immunology: N/A  Neurological: N/A  Psychiatric: insomnia, restless leg  The following represents the patient's updated allergies and side effects list: Allergies  Allergen Reactions  . Penicillins Other (See Comments)    Doesn't remember reaction    Labs since last visit  of relevance include the following: Results for orders placed or performed during the hospital encounter of 05/12/14  MRSA PCR Screening  Result Value Ref Range   MRSA by PCR NEGATIVE NEGATIVE  APTT  Result Value Ref Range   aPTT 39 (H) 24 - 37 seconds  CBC  Result Value Ref Range   WBC 7.1 4.0 - 10.5 K/uL   RBC 4.22 4.22 - 5.81 MIL/uL   Hemoglobin 13.5 13.0 - 17.0 g/dL   HCT 29.5 (L) 62.1 - 30.8 %   MCV 88.9 78.0 - 100.0 fL   MCH 32.0 26.0 - 34.0 pg   MCHC 36.0 30.0 - 36.0 g/dL   RDW 65.7 84.6 - 96.2 %   Platelets 210 150 - 400 K/uL  Comprehensive metabolic panel  Result Value Ref Range   Sodium 137 137 - 147 mEq/L   Potassium 3.8 3.7 - 5.3 mEq/L   Chloride 100 96 - 112 mEq/L   CO2 24 19 - 32 mEq/L   Glucose, Bld 115 (H) 70 - 99 mg/dL   BUN 13 6 - 23 mg/dL   Creatinine, Ser 9.52 0.50 - 1.35 mg/dL   Calcium 8.6 8.4 - 84.1 mg/dL   Total Protein 6.2 6.0 - 8.3 g/dL   Albumin 2.9 (L) 3.5 - 5.2 g/dL   AST 15 0 - 37 U/L   ALT 15 0 - 53 U/L   Alkaline Phosphatase 60 39 - 117 U/L   Total Bilirubin 0.3 0.3 - 1.2 mg/dL   GFR calc non Af Amer 90 (L) >90 mL/min   GFR calc Af Amer >90 >  90 mL/min   Anion gap 13 5 - 15  Protime-INR  Result Value Ref Range   Prothrombin Time 17.6 (H) 11.6 - 15.2 seconds   INR 1.45 0.00 - 1.49  Basic metabolic panel  Result Value Ref Range   Sodium 138 137 - 147 mEq/L   Potassium 3.8 3.7 - 5.3 mEq/L   Chloride 105 96 - 112 mEq/L   CO2 18 (L) 19 - 32 mEq/L   Glucose, Bld 134 (H) 70 - 99 mg/dL   BUN 10 6 - 23 mg/dL   Creatinine, Ser 1.610.85 0.50 - 1.35 mg/dL   Calcium 7.9 (L) 8.4 - 10.5 mg/dL   GFR calc non Af Amer >90 >90 mL/min   GFR calc Af Amer >90 >90 mL/min   Anion gap 15 5 - 15  CBC  Result Value Ref Range   WBC 7.9 4.0 - 10.5 K/uL   RBC 4.34 4.22 - 5.81 MIL/uL   Hemoglobin 13.9 13.0 - 17.0 g/dL   HCT 09.639.2 04.539.0 - 40.952.0 %   MCV 90.3 78.0 - 100.0 fL   MCH 32.0 26.0 - 34.0 pg   MCHC 35.5 30.0 - 36.0 g/dL   RDW 81.112.2 91.411.5 - 78.215.5 %    Platelets 209 150 - 400 K/uL  Troponin I-(serum)  Result Value Ref Range   Troponin I 2.41 (HH) <0.30 ng/mL  Troponin I-(serum)  Result Value Ref Range   Troponin I 1.66 (HH) <0.30 ng/mL  Magnesium  Result Value Ref Range   Magnesium 2.1 1.5 - 2.5 mg/dL  Comprehensive metabolic panel  Result Value Ref Range   Sodium 135 (L) 137 - 147 mEq/L   Potassium 4.0 3.7 - 5.3 mEq/L   Chloride 102 96 - 112 mEq/L   CO2 21 19 - 32 mEq/L   Glucose, Bld 99 70 - 99 mg/dL   BUN 12 6 - 23 mg/dL   Creatinine, Ser 9.560.84 0.50 - 1.35 mg/dL   Calcium 7.9 (L) 8.4 - 10.5 mg/dL   Total Protein 5.7 (L) 6.0 - 8.3 g/dL   Albumin 2.6 (L) 3.5 - 5.2 g/dL   AST 12 0 - 37 U/L   ALT 13 0 - 53 U/L   Alkaline Phosphatase 56 39 - 117 U/L   Total Bilirubin 0.3 0.3 - 1.2 mg/dL   GFR calc non Af Amer >90 >90 mL/min   GFR calc Af Amer >90 >90 mL/min   Anion gap 12 5 - 15  CBC WITH DIFFERENTIAL  Result Value Ref Range   WBC 6.3 4.0 - 10.5 K/uL   RBC 3.98 (L) 4.22 - 5.81 MIL/uL   Hemoglobin 12.7 (L) 13.0 - 17.0 g/dL   HCT 21.335.1 (L) 08.639.0 - 57.852.0 %   MCV 88.2 78.0 - 100.0 fL   MCH 31.9 26.0 - 34.0 pg   MCHC 36.2 (H) 30.0 - 36.0 g/dL   RDW 46.912.1 62.911.5 - 52.815.5 %   Platelets 211 150 - 400 K/uL   Neutrophils Relative % 60 43 - 77 %   Neutro Abs 3.8 1.7 - 7.7 K/uL   Lymphocytes Relative 25 12 - 46 %   Lymphs Abs 1.5 0.7 - 4.0 K/uL   Monocytes Relative 13 (H) 3 - 12 %   Monocytes Absolute 0.8 0.1 - 1.0 K/uL   Eosinophils Relative 2 0 - 5 %   Eosinophils Absolute 0.1 0.0 - 0.7 K/uL   Basophils Relative 0 0 - 1 %   Basophils Absolute 0.0 0.0 - 0.1 K/uL  Lipid panel  Result Value Ref Range   Cholesterol 133 0 - 200 mg/dL   Triglycerides 72 <161<150 mg/dL   HDL 29 (L) >09>39 mg/dL   Total CHOL/HDL Ratio 4.6 RATIO   VLDL 14 0 - 40 mg/dL   LDL Cholesterol 90 0 - 99 mg/dL  Heparin level (unfractionated)  Result Value Ref Range   Heparin Unfractionated <0.10 (L) 0.30 - 0.70 IU/mL  Lipid panel  Result Value Ref Range    Cholesterol 126 0 - 200 mg/dL   Triglycerides 90 <604<150 mg/dL   HDL 28 (L) >54>39 mg/dL   Total CHOL/HDL Ratio 4.5 RATIO   VLDL 18 0 - 40 mg/dL   LDL Cholesterol 80 0 - 99 mg/dL  Heparin level (unfractionated)  Result Value Ref Range   Heparin Unfractionated 0.15 (L) 0.30 - 0.70 IU/mL  CBC  Result Value Ref Range   WBC 8.3 4.0 - 10.5 K/uL   RBC 4.06 (L) 4.22 - 5.81 MIL/uL   Hemoglobin 12.7 (L) 13.0 - 17.0 g/dL   HCT 09.836.2 (L) 11.939.0 - 14.752.0 %   MCV 89.2 78.0 - 100.0 fL   MCH 31.3 26.0 - 34.0 pg   MCHC 35.1 30.0 - 36.0 g/dL   RDW 82.912.2 56.211.5 - 13.015.5 %   Platelets 221 150 - 400 K/uL  Hemoglobin A1c  Result Value Ref Range   Hgb A1c MFr Bld 5.1 <5.7 %   Mean Plasma Glucose 100 <117 mg/dL  Heparin level (unfractionated)  Result Value Ref Range   Heparin Unfractionated 0.36 0.30 - 0.70 IU/mL  CBC  Result Value Ref Range   WBC 7.3 4.0 - 10.5 K/uL   RBC 4.46 4.22 - 5.81 MIL/uL   Hemoglobin 13.9 13.0 - 17.0 g/dL   HCT 86.539.4 78.439.0 - 69.652.0 %   MCV 88.3 78.0 - 100.0 fL   MCH 31.2 26.0 - 34.0 pg   MCHC 35.3 30.0 - 36.0 g/dL   RDW 29.512.2 28.411.5 - 13.215.5 %   Platelets 244 150 - 400 K/uL  Basic metabolic panel  Result Value Ref Range   Sodium 138 137 - 147 mEq/L   Potassium 4.0 3.7 - 5.3 mEq/L   Chloride 105 96 - 112 mEq/L   CO2 20 19 - 32 mEq/L   Glucose, Bld 115 (H) 70 - 99 mg/dL   BUN 8 6 - 23 mg/dL   Creatinine, Ser 4.400.95 0.50 - 1.35 mg/dL   Calcium 8.2 (L) 8.4 - 10.5 mg/dL   GFR calc non Af Amer 89 (L) >90 mL/min   GFR calc Af Amer >90 >90 mL/min   Anion gap 13 5 - 15  Troponin I  Result Value Ref Range   Troponin I 1.25 (HH) <0.30 ng/mL  Glucose, capillary  Result Value Ref Range   Glucose-Capillary 112 (H) 70 - 99 mg/dL   Comment 1 Notify RN   Glucose, capillary  Result Value Ref Range   Glucose-Capillary 86 70 - 99 mg/dL   Comment 1 Notify RN   I-Stat Troponin, [facility])  Result Value Ref Range   Troponin i, poc 4.50 (HH) 0.00 - 0.08 ng/mL   Comment NOTIFIED PHYSICIAN     Comment 3          POCT Activated clotting time  Result Value Ref Range   Activated Clotting Time 157 seconds  POCT Activated clotting time  Result Value Ref Range   Activated Clotting Time 354 seconds  POCT Activated clotting time  Result Value  Ref Range   Activated Clotting Time 371 seconds    The neurologically relevant items on the patient's problem list were reviewed on today's visit.  Neurologic Examination  A problem focused neurological exam (12 or more points of the single system neurologic examination, vital signs counts as 1 point, cranial nerves count for 8 points) was performed.  Blood pressure 114/68, pulse 54, height 5' 7.5" (1.715 m), weight 169 lb 3.2 oz (76.749 kg).  General - Well nourished, well developed, in no apparent distress.  Ophthalmologic - Sharp disc margins OU.  Cardiovascular - Regular rate and rhythm with no murmur.  Mental Status -  Level of arousal and orientation to time, place, and person were intact. Language including expression, naming, repetition, comprehension, reading, and writing was assessed and found intact. Attention span and concentration were normal. Recent and remote memory were intact. Fund of Knowledge was assessed and was intact.  Cranial Nerves II - XII - II - Visual field intact OU. III, IV, VI - Extraocular movements intact. V - Facial sensation intact bilaterally. VII - Facial movement intact bilaterally. VIII - Hearing & vestibular intact bilaterally. X - Palate elevates symmetrically. XI - Chin turning & shoulder shrug intact bilaterally. XII - Tongue protrusion intact.  Motor Strength - The patient's strength was normal in all extremities and pronator drift was absent.  Bulk was normal and fasciculations were absent.   Motor Tone - Muscle tone was assessed at the neck and appendages and was normal.  Reflexes - The patient's reflexes were normal in all extremities and he had no pathological reflexes.  Sensory -  Light touch, temperature/pinprick, vibration and proprioception, and Romberg testing were assessed and were normal.    Coordination - The patient had normal movements in the hands and feet with no ataxia or dysmetria.  Tremor was absent.  Gait and Station - The patient's transfers, posture, gait, station, and turns were observed as normal.  Data reviewed: I personally reviewed the images and agree with the radiology interpretations.  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 This finding may well warrant CT or MR angiography to further assess. The patient has already had a dose of iodinated contrast earlier today; advise appropriate hydration before repeating dose of iodinated contrast nonemergently. There is slight periventricular small vessel disease. No intracranial mass, hemorrhage, or acute appearing infarct.   [name] Wo Contrast/MR of the brain 05/13/2014 IMPRESSION: 1. 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 2. Scattered small acute right MCA territory infarcts. No mass effect or hemorrhage.   CUS - 1-39% ICA stenosis. Vertebral artery flow is antegrade.   2D echo - - Left ventricle: The cavity size was normal. Systolic function was mildly reduced. The estimated ejection fraction was in the range of 45% to 50%. There is akinesis of the apicalanteroseptal and apical myocardium. There was a thrombus. There was an apical thrombus. - Atrial septum: No defect or patent foramen ovale was identified.  LDL 90 and A1C 5.1  Assessment: As you may recall, he is a 63 y.o. Caucasian male was admitted to [facility] due to right MCA territory embolic stroke. At meantime, he was found to have MI, LV thrombus as well as paroxysmal A flutter. He is on ASA, brilinta and eliquis. He is following with Dr. Marni Griffon and has plan to have another cardiac stent in Dec. He will  continue to have ASA, brilinta and eliquis for stroke and cardiac prevention. Bleeding precautions.  Plan:    - continue ASA and brilinta and eliquis for stroke and cardiac prevention - bleeding precautions - He understands if there is any neuro changes, he needs to call 911 or go to the nearest ER. - follow up with Hawani next Monday. - continue check BP at cardio rehab and recommend to have home BP monitor device. Avoid hypertension as in triple therapy. - Follow up with your primary care physician for stroke risk factor modification. Recommend maintain blood pressure goal <130/80, diabetes with hemoglobin A1c goal below 6.5% and lipids with LDL cholesterol goal below 70 mg/dL.  - no restriction for driving but his family has to ride with him for 2-3 times and both have to be comfortable with his driving before he can drive alone. - follow up in 3 months.   No orders of the defined types were placed in this encounter.    Meds ordered this encounter  Medications  . carvedilol (COREG) 6.25 MG tablet    Sig: Take 6.25 mg by mouth daily.    Refill:  2    Patient Instructions  - continue ASA and brilinta and eliquis for stroke and cardiac prevention - watch for any sign of bleeding - if there is any neuro changes, please call 911 or go to the nearest ER. - follow up with Hawani next Monday for use of dural antiplatelets and eliquis - continue check BP at cardio rehab and recommend to have home BP monitor device - Follow up with your primary care physician for stroke risk factor modification. Recommend maintain blood pressure goal <130/80, diabetes with hemoglobin A1c goal below 6.5% and lipids with LDL cholesterol goal below 70 mg/dL.  - follow up in 3 months   Marvel Plan, MD PhD [facility] [address] [place], [place] [address] [phone]

## 2014-06-19 ENCOUNTER — Ambulatory Visit: Payer: BC Managed Care – PPO | Admitting: Occupational Therapy

## 2014-06-20 ENCOUNTER — Encounter (HOSPITAL_COMMUNITY)
Admission: RE | Admit: 2014-06-20 | Discharge: 2014-06-20 | Disposition: A | Discharge status: Home / Self Care | Payer: BC Managed Care – PPO | Source: Ambulatory Visit | Attending: Cardiology | Admitting: Cardiology

## 2014-06-20 DIAGNOSIS — Z5189 Encounter for other specified aftercare: Secondary | ICD-10-CM | POA: Diagnosis not present

## 2014-06-21 ENCOUNTER — Ambulatory Visit: Payer: BC Managed Care – PPO | Admitting: Occupational Therapy

## 2014-06-21 ENCOUNTER — Encounter: Payer: Self-pay | Admitting: Occupational Therapy

## 2014-06-21 DIAGNOSIS — M6281 Muscle weakness (generalized): Secondary | ICD-10-CM

## 2014-06-21 DIAGNOSIS — IMO0002 Reserved for concepts with insufficient information to code with codable children: Secondary | ICD-10-CM

## 2014-06-21 DIAGNOSIS — I69319 Unspecified symptoms and signs involving cognitive functions following cerebral infarction: Secondary | ICD-10-CM

## 2014-06-21 DIAGNOSIS — R41842 Visuospatial deficit: Secondary | ICD-10-CM

## 2014-06-21 DIAGNOSIS — Z5189 Encounter for other specified aftercare: Secondary | ICD-10-CM | POA: Diagnosis not present

## 2014-06-21 NOTE — Therapy (Signed)
Occupational Therapy Treatment  Patient Details  Name: Gregory Shields MRN: [id number] Date of Birth: 22-May-1951  Encounter Date: 06/21/2014    Past Medical History  Diagnosis Date  . Depression   . Sleep apnea     Past Surgical History  Procedure Laterality Date  . None      There were no vitals taken for this visit.  Visit Diagnosis:  Cognitive deficits following cerebral infarction  Visuospatial deficit  Lack of coordination due to stroke  Generalized muscle weakness      Subjective Assessment - 06/21/14 0838    Symptoms Pt reports that he went to the neurologist Monday and that MD cleared him for driving.  Pt reports that he has been driving without difficulty.   Currently in Pain? No/denies            OT Treatments/Exercises (OP) - 06/21/14 0001    ADLs   ADL Comments Mod complex map task for visual scanning, planning/problem solving.  Writing with legibility.                Problem List Patient Active Problem List   Diagnosis Date Noted  . Cerebral infarction due to embolism of right middle cerebral artery 06/18/2014  . History of myocardial infarction less than 8 weeks 06/18/2014  . LV (left ventricular) mural thrombus 06/18/2014  . Atrial flutter, unspecified 06/18/2014  . Cerebral embolism with cerebral infarction 05/13/2014  . Acute anterolateral wall MI 05/12/2014                                           Willa FraterAngela Shonteria Abeln, OTR/L [facility] [address] [place], [place]  [address] Phone:   [phone] Fax:  [phone]    [facility],Darin Arndt 06/21/2014, 8:53 AM

## 2014-06-21 NOTE — Therapy (Signed)
Occupational Therapy Treatment  Patient Details  Name: Gregory Shields MRN: [id number] Date of Birth: [birth year]  Encounter Date: 06/21/2014      OT End of Session - 06/21/14 0937    Visit Number 7   Date for OT Re-Evaluation 06/23/14   Authorization Type BC[employer], 30 visit limit PT/OT   OT Start Time (ACUTE ONLY) 0833   OT Stop Time (ACUTE ONLY) 0918   OT Time Calculation (min) 45 min   Behavior During Therapy [facility] for tasks assessed/performed      Past Medical History  Diagnosis Date  . Depression   . Sleep apnea     Past Surgical History  Procedure Laterality Date  . None      There were no vitals taken for this visit.  Visit Diagnosis:  Cognitive deficits following cerebral infarction  Visuospatial deficit  Lack of coordination due to stroke  Generalized muscle weakness      Subjective Assessment - 06/21/14 0838    Symptoms Pt reports that he went to the neurologist Monday and that MD cleared him for driving.  Pt reports that he has been driving without difficulty.  Pt reports continued difficulty with LUE for work tasks.  Pt reports that his wife has to tell him he has food on mouth when eating.   Currently in Pain? No/denies            OT Treatments/Exercises (OP) - 06/21/14 0001    ADLs   ADL Comments Mod complex map task for visual scanning, planning/problem solving with good accuracy.  Writing with approx 60-75% legibility.  Complex planning activities with good attention to detail.   Visual/Perceptual Exercises   Other Exercises Environmental scanning to look for objects while ambulating and tossing ball between hands for simple divided attention with approx 90% accuracy on first attempt with 2 items missed on L and 1 missed on R.  Then upon 2nd attempt with approx 97% accuracy with 1 item missed on R at busy area.            OT Short Term Goals - 06/21/14 0946    OT SHORT TERM GOAL #3   Title Pt will be able to complete complex functional  problem-solving tasks with at least 75% accuracy.   Status Achieved  at least this level            Plan - 06/21/14 0945    Clinical Impression Statement Pt demo improved visual scanning and problem solving this week.  All STGs met.  Pt will benefit from trying move complex cognitive/visual activities to ensure accuracy in prep for driving/work tasks.   Plan CVA education, complex problem solving, complex environmental scanning, LUE coordination for work tasks        Problem List Patient Active Problem List   Diagnosis Date Noted  . Cerebral infarction due to embolism of right middle cerebral artery 06/18/2014  . History of myocardial infarction less than 8 weeks 06/18/2014  . LV (left ventricular) mural thrombus 06/18/2014  . Atrial flutter, unspecified 06/18/2014  . Cerebral embolism with cerebral infarction 05/13/2014  . Acute anterolateral wall MI 05/12/2014                                             Vianne Bulls, OTR/L [facility] [address] [place], [place]  [address] Phone:   [phone]  Fax:  [place] 06/21/2014, 11:31 AM

## 2014-06-22 ENCOUNTER — Encounter (HOSPITAL_COMMUNITY): Payer: BC Managed Care – PPO

## 2014-06-25 ENCOUNTER — Encounter (HOSPITAL_COMMUNITY)
Admission: RE | Admit: 2014-06-25 | Discharge: 2014-06-25 | Disposition: A | Discharge status: Home / Self Care | Payer: BC Managed Care – PPO | Source: Ambulatory Visit | Attending: Cardiology | Admitting: Cardiology

## 2014-06-25 ENCOUNTER — Encounter: Payer: Self-pay | Admitting: Occupational Therapy

## 2014-06-25 ENCOUNTER — Other Ambulatory Visit (HOSPITAL_COMMUNITY): Payer: Self-pay | Admitting: Cardiology

## 2014-06-25 ENCOUNTER — Ambulatory Visit: Payer: BC Managed Care – PPO | Admitting: Occupational Therapy

## 2014-06-25 DIAGNOSIS — M6281 Muscle weakness (generalized): Secondary | ICD-10-CM

## 2014-06-25 DIAGNOSIS — IMO0002 Reserved for concepts with insufficient information to code with codable children: Secondary | ICD-10-CM

## 2014-06-25 DIAGNOSIS — Z5189 Encounter for other specified aftercare: Secondary | ICD-10-CM | POA: Diagnosis not present

## 2014-06-25 DIAGNOSIS — R071 Chest pain on breathing: Secondary | ICD-10-CM

## 2014-06-25 DIAGNOSIS — R41842 Visuospatial deficit: Secondary | ICD-10-CM

## 2014-06-25 DIAGNOSIS — I69319 Unspecified symptoms and signs involving cognitive functions following cerebral infarction: Secondary | ICD-10-CM

## 2014-06-25 NOTE — Therapy (Signed)
Occupational Therapy Treatment  Patient Details  Name: Gregory Shields MRN: [id number] Date of Birth: Jul 16, 1951  Encounter Date: 06/25/2014      OT End of Session - 06/25/14 1251    Visit Number 8   Number of Visits 17   Date for OT Re-Evaluation 06/23/14   Authorization Type BCBS, 30 visit limit PT/OT   OT Start Time 1149   OT Stop Time 1230   OT Time Calculation (min) 41 min   Activity Tolerance Patient tolerated treatment well      Past Medical History  Diagnosis Date  . Depression   . Sleep apnea     Past Surgical History  Procedure Laterality Date  . None      There were no vitals taken for this visit.  Visit Diagnosis:  Cognitive deficits following cerebral infarction  Visuospatial deficit  Lack of coordination due to stroke  Generalized muscle weakness      Subjective Assessment - 06/25/14 1155    Symptoms Pt reports that he has a cardiac stress test scheduled Wednesday.  Pt reports a little difficulty with trimming fingernails and continued difficulty with writing.   Currently in Pain? No/denies            OT Treatments/Exercises (OP) - 06/25/14 0001    ADLs   ADL Comments Modified Multiple Elements Test for divided attention and attention to detail.  Pt did not follow all rules correctly as he did not monitor time and did tasks in incorrect order.  Activities completed were correct except for one reversal error.  Reviewed attention to detail mistakes with pt.  Pt verbalized understanding.   Neurological Re-education Exercises   Other Exercises 1 Placing grooved pegs in pegboard with tweezers with mod difficulty.   Other Exercises 2 Functional reaching with 2lb wt on wrist to place small pegs in overhead pegboard to copy design for increased coordination/strength.  Pt copied design with 100% accuracy.  Removed pegs with in-hand manipulation with min drops.   Visual/Perceptual Exercises   Scanning - Environmental in busy environments with 90%  accuracy, 3 attempts to find last item.                Plan - 06/25/14 1251    Clinical Impression Statement Pt continues to improve with visual scanning and attention to details.      Plan CVA education with writing, complex problem-solving        Problem List Patient Active Problem List   Diagnosis Date Noted  . Cerebral infarction due to embolism of right middle cerebral artery 06/18/2014  . History of myocardial infarction less than 8 weeks 06/18/2014  . LV (left ventricular) mural thrombus 06/18/2014  . Atrial flutter, unspecified 06/18/2014  . Cerebral embolism with cerebral infarction 05/13/2014  . Acute anterolateral wall MI 05/12/2014                                           Willa Fraterngela Kendall Justo, OTR/L 06/25/2014 12:53 PM Phone: [phone] Fax: [phone]      [facility]RE[facility],Indria Bishara 06/25/2014, 12:53 PM

## 2014-06-26 ENCOUNTER — Encounter: Payer: Self-pay | Admitting: Occupational Therapy

## 2014-06-26 ENCOUNTER — Ambulatory Visit: Payer: BC Managed Care – PPO | Admitting: Occupational Therapy

## 2014-06-26 DIAGNOSIS — R41842 Visuospatial deficit: Secondary | ICD-10-CM

## 2014-06-26 DIAGNOSIS — M6281 Muscle weakness (generalized): Secondary | ICD-10-CM

## 2014-06-26 DIAGNOSIS — IMO0002 Reserved for concepts with insufficient information to code with codable children: Secondary | ICD-10-CM

## 2014-06-26 DIAGNOSIS — Z5189 Encounter for other specified aftercare: Secondary | ICD-10-CM | POA: Diagnosis not present

## 2014-06-26 DIAGNOSIS — I69319 Unspecified symptoms and signs involving cognitive functions following cerebral infarction: Secondary | ICD-10-CM

## 2014-06-26 NOTE — Therapy (Deleted)
Occupational Therapy Treatment  Patient Details  Name: Gregory HonourMark Weinheimer MRN: [id number] Date of Birth: Jun 25, 1951  Encounter Date: 06/26/2014      OT End of Session - 06/25/14 1251    Visit Number 8   Number of Visits 17   Date for OT Re-Evaluation 06/23/14   Authorization Type [employer]BS, 30 visit limit PT/OT   OT Start Time 1149   OT Stop Time 1230   OT Time Calculation (min) 41 min   Activity Tolerance Patient tolerated treatment well      Past Medical History  Diagnosis Date  . Depression   . Sleep apnea     Past Surgical History  Procedure Laterality Date  . None      There were no vitals taken for this visit.  Visit Diagnosis:  Cognitive deficits following cerebral infarction  Visuospatial deficit  Lack of coordination due to stroke  Generalized muscle weakness      Subjective Assessment - 06/26/14 0753    Symptoms "I don't think that there is anything wrong with my mind"  Pt reports cardic rehab going well.   Currently in Pain? No/denies            OT Treatments/Exercises (OP) - 06/26/14 0001    ADLs   Writing Copying CVA symptoms with approx 85% legibility.  Cued pt for letter formation and to slow down with specific attention to "a."   ADL Comments Complex functional word problems with 1 math error, but set-up correct for 1 problem and 1 correct.  Question if decreased handwriting legibility is contributing.  Ambulating while tossing ball between hands and completing category generation for divided attention with occasional min difficulty.   Visual/Perceptual Exercises   Scanning - Environmental in busy environment with 85% accuracy, items missed on R                Plan - 06/25/14 1251    Clinical Impression Statement Pt continues to improve with visual scanning and attention to details.      Plan CVA education with writing, complex problem-solving        Problem List Patient Active Problem List   Diagnosis Date Noted  . Cerebral  infarction due to embolism of right middle cerebral artery 06/18/2014  . History of myocardial infarction less than 8 weeks 06/18/2014  . LV (left ventricular) mural thrombus 06/18/2014  . Atrial flutter, unspecified 06/18/2014  . Cerebral embolism with cerebral infarction 05/13/2014  . Acute anterolateral wall MI 05/12/2014                                              [facility],Markiesha Delia 06/26/2014, 12:55 PM

## 2014-06-26 NOTE — Therapy (Signed)
Occupational Therapy Treatment  Patient Details  Name: Gregory HonourMark Summa MRN: [id number] Date of Birth: 15-Nov-1950  Encounter Date: 06/26/2014      OT End of Session - 06/26/14 1300    Visit Number 9   Number of Visits 17   Date for OT Re-Evaluation 06/23/14   Authorization Type BCBS, 30 visit limit PT/OT   OT Start Time 0747   OT Stop Time 0832   OT Time Calculation (min) 45 min   Activity Tolerance Patient tolerated treatment well      Past Medical History  Diagnosis Date  . Depression   . Sleep apnea     Past Surgical History  Procedure Laterality Date  . None      There were no vitals taken for this visit.  Visit Diagnosis:  Cognitive deficits following cerebral infarction  Visuospatial deficit  Lack of coordination due to stroke  Generalized muscle weakness      Subjective Assessment - 06/26/14 0753    Symptoms "I don't think that there is anything wrong with my mind"  Pt reports cardic rehab going well.   Currently in Pain? No/denies            OT Treatments/Exercises (OP) - 06/26/14 0001    ADLs   Writing Copying CVA symptoms with approx 85% legibility.  Cued pt for letter formation and to slow down with specific attention to "a."   ADL Comments Complex functional word problems with 1 math error, but set-up correct for 1 problem and 1 correct.  Question if decreased handwriting legibility is contributing.  Ambulating while tossing ball between hands and completing category generation for divided attention with occasional min difficulty.   Visual/Perceptual Exercises   Scanning - Environmental in busy environment with 85% accuracy, items missed on R          OT Education - 06/26/14 1303    Education provided Yes   Education Details CVA education   Person(s) Educated Patient   Methods Explanation;Handout   Comprehension Verbalized understanding            OT Long Term Goals - 06/26/14 1320    OT LONG TERM GOAL #1   Title Pt will  verbalize understanding of the risk factors and warning signs/symptoms of CVA   Status Achieved          Plan - 06/26/14 1301    Clinical Impression Statement Pt continues to improve with attention to details.  Writing may be affecting ability to complete written problem solving activities accurately--Will assess further.   Plan writing, divided attention, complex environmental scanning.        Problem List Patient Active Problem List   Diagnosis Date Noted  . Cerebral infarction due to embolism of right middle cerebral artery 06/18/2014  . History of myocardial infarction less than 8 weeks 06/18/2014  . LV (left ventricular) mural thrombus 06/18/2014  . Atrial flutter, unspecified 06/18/2014  . Cerebral embolism with cerebral infarction 05/13/2014  . Acute anterolateral wall MI 05/12/2014                                             Willa FraterAngela Freeman, OTR/L 06/26/2014 1:23 PM Phone: [phone] Fax: [phone]    [facility] CenterFREEMAN,ANGELA 06/26/2014, 1:23 PM

## 2014-06-27 ENCOUNTER — Encounter (HOSPITAL_COMMUNITY): Payer: BC Managed Care – PPO

## 2014-06-27 ENCOUNTER — Ambulatory Visit (HOSPITAL_COMMUNITY)
Admission: RE | Admit: 2014-06-27 | Discharge: 2014-06-27 | Disposition: A | Discharge status: Home / Self Care | Payer: BC Managed Care – PPO | Source: Ambulatory Visit | Attending: Cardiology | Admitting: Cardiology

## 2014-06-27 ENCOUNTER — Other Ambulatory Visit: Payer: Self-pay

## 2014-06-27 DIAGNOSIS — R071 Chest pain on breathing: Secondary | ICD-10-CM

## 2014-06-27 DIAGNOSIS — R079 Chest pain, unspecified: Secondary | ICD-10-CM | POA: Insufficient documentation

## 2014-06-27 MED ORDER — REGADENOSON 0.4 MG/5ML IV SOLN
INTRAVENOUS | Status: AC
  Filled 2014-06-27: qty 5

## 2014-06-27 MED ORDER — TECHNETIUM TC 99M SESTAMIBI GENERIC - CARDIOLITE
10.0000 | Freq: Once | INTRAVENOUS | Status: AC | PRN
  Administered 2014-06-27: 10 via INTRAVENOUS

## 2014-06-27 MED ORDER — TECHNETIUM TC 99M SESTAMIBI GENERIC - CARDIOLITE
30.0000 | Freq: Once | INTRAVENOUS | Status: AC | PRN
  Administered 2014-06-27: 30 via INTRAVENOUS

## 2014-06-27 MED ORDER — REGADENOSON 0.4 MG/5ML IV SOLN
0.4000 mg | Freq: Once | INTRAVENOUS | Status: AC
  Administered 2014-06-27: 0.4 mg via INTRAVENOUS

## 2014-06-29 ENCOUNTER — Encounter (HOSPITAL_COMMUNITY): Payer: BC Managed Care – PPO

## 2014-07-02 ENCOUNTER — Ambulatory Visit: Payer: BC Managed Care – PPO | Admitting: Occupational Therapy

## 2014-07-02 ENCOUNTER — Encounter (HOSPITAL_COMMUNITY): Payer: BC Managed Care – PPO

## 2014-07-03 ENCOUNTER — Encounter: Payer: Self-pay | Admitting: Occupational Therapy

## 2014-07-03 ENCOUNTER — Ambulatory Visit: Payer: BC Managed Care – PPO | Attending: Cardiology | Admitting: Occupational Therapy

## 2014-07-03 DIAGNOSIS — R41842 Visuospatial deficit: Secondary | ICD-10-CM | POA: Diagnosis not present

## 2014-07-03 DIAGNOSIS — M6281 Muscle weakness (generalized): Secondary | ICD-10-CM | POA: Diagnosis not present

## 2014-07-03 DIAGNOSIS — Z8673 Personal history of transient ischemic attack (TIA), and cerebral infarction without residual deficits: Secondary | ICD-10-CM | POA: Insufficient documentation

## 2014-07-03 DIAGNOSIS — R419 Unspecified symptoms and signs involving cognitive functions and awareness: Secondary | ICD-10-CM | POA: Diagnosis not present

## 2014-07-03 DIAGNOSIS — R279 Unspecified lack of coordination: Secondary | ICD-10-CM | POA: Diagnosis not present

## 2014-07-03 DIAGNOSIS — I252 Old myocardial infarction: Secondary | ICD-10-CM | POA: Insufficient documentation

## 2014-07-03 DIAGNOSIS — I10 Essential (primary) hypertension: Secondary | ICD-10-CM | POA: Diagnosis not present

## 2014-07-03 DIAGNOSIS — Z5189 Encounter for other specified aftercare: Secondary | ICD-10-CM | POA: Diagnosis not present

## 2014-07-03 DIAGNOSIS — I69319 Unspecified symptoms and signs involving cognitive functions following cerebral infarction: Secondary | ICD-10-CM

## 2014-07-03 DIAGNOSIS — IMO0002 Reserved for concepts with insufficient information to code with codable children: Secondary | ICD-10-CM

## 2014-07-03 NOTE — Therapy (Signed)
Occupational Therapy Treatment  Patient Details  Name: Gregory Shields MRN: [id number] Date of Birth: [birth year]  Encounter Date: 07/03/2014      OT End of Session - 07/03/14 1028    Visit Number 10   Number of Visits 17   Date for OT Re-Evaluation 06/23/14   Authorization Type BCBS, 30 visit limit PT/OT   OT Start Time 0834   OT Stop Time 0917   OT Time Calculation (min) 43 min   Activity Tolerance Patient tolerated treatment well      Past Medical History  Diagnosis Date  . Depression   . Sleep apnea     Past Surgical History  Procedure Laterality Date  . None      There were no vitals taken for this visit.  Visit Diagnosis:  Cognitive deficits following cerebral infarction  Visuospatial deficit  Lack of coordination due to stroke  Generalized muscle weakness      Subjective Assessment - 07/03/14 0837    Symptoms  Pt reports stress test showed enlarged heart, but will just monitor since no pain or SOB.  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   Currently in Pain? No/denies            OT Treatments/Exercises (OP) - 07/03/14 0001    ADLs   Writing Copying test with approx 95% legibility   ADL Comments Deductive reasoning puzzle with mod cues for deduction with first puzzle.  Then, 2nd with min cues to complete.  Pt easily frustrated with difficulty.  Began functional word problem for problem solving completed with 1 addition error that pt self-identified and corrected.   Fine Motor Coordination   Small Pegboard Placing O'connor pegs in pegboard with tweezers with min difficulty.   Visual/Perceptual Exercises   Scanning - Environmental busy environment with 90% accuracy and v.c. for remaining.  Missed item on R side.              OT Long Term Goals - 07/03/14 0933    OT LONG TERM GOAL #2   Title Pt will perform simple environmental scanning with at least 95% accuracy for increased safety.   Status Partially Met   d/c as pt performs at 90% and is missing items on R side not L now, which may be due to prior condition          Plan - 07/03/14 0914    Clinical Impression Statement Pt reports prior blind spot on R eye which may be impacting visual scanning for small objects as pt has been recently missing small items on R side.  Therefore, will d/c environmental scanning goal since this may be due to prior condition and pt performing at 90% accuracy.   Plan check homework (functional problem solving word problem given), modified multi-elements test        Problem List Patient Active Problem List   Diagnosis Date Noted  . Cerebral infarction due to embolism of right middle cerebral artery 06/18/2014  . History of myocardial infarction less than 8 weeks 06/18/2014  . LV (left ventricular) mural thrombus 06/18/2014  . Atrial flutter, unspecified 06/18/2014  . Cerebral embolism with cerebral infarction 05/13/2014  . Acute anterolateral wall MI 05/12/2014                                   Vianne Bulls, OTR/L 07/03/2014 10:32 AM  Phone: [phone] Fax: [place] 07/03/2014, 10:31 AM

## 2014-07-04 ENCOUNTER — Encounter (HOSPITAL_COMMUNITY)
Admission: RE | Admit: 2014-07-04 | Discharge: 2014-07-04 | Disposition: A | Discharge status: Home / Self Care | Payer: BC Managed Care – PPO | Source: Ambulatory Visit | Attending: Cardiology | Admitting: Cardiology

## 2014-07-04 DIAGNOSIS — I4892 Unspecified atrial flutter: Secondary | ICD-10-CM | POA: Insufficient documentation

## 2014-07-04 DIAGNOSIS — I252 Old myocardial infarction: Secondary | ICD-10-CM | POA: Diagnosis not present

## 2014-07-04 DIAGNOSIS — Z955 Presence of coronary angioplasty implant and graft: Secondary | ICD-10-CM | POA: Diagnosis not present

## 2014-07-04 DIAGNOSIS — E785 Hyperlipidemia, unspecified: Secondary | ICD-10-CM | POA: Insufficient documentation

## 2014-07-04 DIAGNOSIS — I4891 Unspecified atrial fibrillation: Secondary | ICD-10-CM | POA: Insufficient documentation

## 2014-07-04 DIAGNOSIS — I69352 Hemiplegia and hemiparesis following cerebral infarction affecting left dominant side: Secondary | ICD-10-CM | POA: Insufficient documentation

## 2014-07-04 DIAGNOSIS — I2510 Atherosclerotic heart disease of native coronary artery without angina pectoris: Secondary | ICD-10-CM | POA: Diagnosis not present

## 2014-07-04 DIAGNOSIS — E780 Pure hypercholesterolemia: Secondary | ICD-10-CM | POA: Diagnosis not present

## 2014-07-04 DIAGNOSIS — I10 Essential (primary) hypertension: Secondary | ICD-10-CM | POA: Insufficient documentation

## 2014-07-04 DIAGNOSIS — Z5189 Encounter for other specified aftercare: Secondary | ICD-10-CM | POA: Diagnosis present

## 2014-07-04 DIAGNOSIS — Z8249 Family history of ischemic heart disease and other diseases of the circulatory system: Secondary | ICD-10-CM | POA: Diagnosis not present

## 2014-07-05 ENCOUNTER — Ambulatory Visit: Payer: BC Managed Care – PPO | Admitting: Occupational Therapy

## 2014-07-06 ENCOUNTER — Encounter (HOSPITAL_COMMUNITY)
Admission: RE | Admit: 2014-07-06 | Discharge: 2014-07-06 | Disposition: A | Discharge status: Home / Self Care | Payer: BC Managed Care – PPO | Source: Ambulatory Visit | Attending: Cardiology | Admitting: Cardiology

## 2014-07-06 DIAGNOSIS — Z5189 Encounter for other specified aftercare: Secondary | ICD-10-CM | POA: Diagnosis not present

## 2014-07-06 NOTE — Progress Notes (Signed)
I have reviewed home exercise with Gregory LericheMark on 07/04/2014. The patient was advised to walk 2-4 days per week outside of CRP II for 30-45 minutes continuously.  Pt will also complete one additional day of hand weights outside of CRP II.  Progression of exercise prescription was discussed.  Reviewed THR, pulse, RPE, sign and symptoms, NTG use and when to call 911 or MD.  Pt voiced understanding.  Gregory FreestoneKendall Barnabas Henriques, MS, ACSM RCEP 07/06/2014 12:12 PM

## 2014-07-06 NOTE — Progress Notes (Signed)
Pt arrived at cardiac rehab c/o increased bruising on bilateral arms.  Approximately 1 cm knot present on right upper arm slightly above elbow with bruising surrounding.  Bruising resolving.  Pt reports knot is tender to touch, no change in size.  Dr. Sharyn LullHarwani made aware.  Pt instructed to contact Dr. Sharyn LullHarwani if knot or bruising increases or worsens.  Understanding verbalized

## 2014-07-09 ENCOUNTER — Ambulatory Visit: Payer: BC Managed Care – PPO | Admitting: Occupational Therapy

## 2014-07-09 ENCOUNTER — Encounter (HOSPITAL_COMMUNITY): Payer: BC Managed Care – PPO

## 2014-07-11 ENCOUNTER — Encounter (HOSPITAL_COMMUNITY)
Admission: RE | Admit: 2014-07-11 | Discharge: 2014-07-11 | Disposition: A | Discharge status: Home / Self Care | Payer: BC Managed Care – PPO | Source: Ambulatory Visit | Attending: Cardiology | Admitting: Cardiology

## 2014-07-11 DIAGNOSIS — Z5189 Encounter for other specified aftercare: Secondary | ICD-10-CM | POA: Diagnosis not present

## 2014-07-11 NOTE — Progress Notes (Signed)
Gregory Shields 63 y.o. male Nutrition Note Spoke with pt. Nutrition Survey reviewed with pt. Pt is following Step 1 of the Therapeutic Lifestyle Changes diet. Pt wants to lose wt. Pt has been trying to lose wt by "walking more and exercising in rehab." Pt wt today 77.2 kg, which is up 0.6 kg since admission. Wt loss tips reviewed. Pt expressed understanding of the information reviewed. Pt aware of nutrition education classes offered.  Nutrition Diagnosis ? Food-and nutrition-related knowledge deficit related to lack of exposure to information as related to diagnosis of: ? CVD  ? Overweight related to excessive energy intake as evidenced by a BMI of 26.4  Nutrition Intervention ? Benefits of adopting Therapeutic Lifestyle Changes discussed when Medficts reviewed. ? Pt to attend the Portion Distortion class ? Pt to attend:  ? Nutrition I class          ? Nutrition II class - met 06/12/14 ? Pt given handouts for: ? Nutrition I class ? Continue client-centered nutrition education by RD, as part of interdisciplinary care.  Goal(s) ? Pt to identify and limit food sources of saturated fat, trans fat, and cholesterol ? Pt to identify food quantities necessary to achieve: ? wt loss to a goal wt loss of 5 lb at graduation from cardiac rehab.   Monitor and Evaluate progress toward nutrition goal with team.   Derek Mound, M.Ed, RD, LDN, CDE 07/11/2014 9:25 AM

## 2014-07-12 ENCOUNTER — Encounter (HOSPITAL_COMMUNITY): Payer: Self-pay | Admitting: Cardiology

## 2014-07-12 ENCOUNTER — Ambulatory Visit: Payer: BC Managed Care – PPO | Admitting: Occupational Therapy

## 2014-07-13 ENCOUNTER — Encounter (HOSPITAL_COMMUNITY)
Admission: RE | Admit: 2014-07-13 | Discharge: 2014-07-13 | Disposition: A | Discharge status: Home / Self Care | Payer: BC Managed Care – PPO | Source: Ambulatory Visit | Attending: Cardiology | Admitting: Cardiology

## 2014-07-13 DIAGNOSIS — Z5189 Encounter for other specified aftercare: Secondary | ICD-10-CM | POA: Diagnosis not present

## 2014-07-16 ENCOUNTER — Encounter (HOSPITAL_COMMUNITY)
Admission: RE | Admit: 2014-07-16 | Discharge: 2014-07-16 | Disposition: A | Discharge status: Home / Self Care | Payer: BC Managed Care – PPO | Source: Ambulatory Visit | Attending: Cardiology | Admitting: Cardiology

## 2014-07-16 DIAGNOSIS — Z5189 Encounter for other specified aftercare: Secondary | ICD-10-CM | POA: Diagnosis not present

## 2014-07-17 ENCOUNTER — Encounter: Payer: Self-pay | Admitting: Occupational Therapy

## 2014-07-17 ENCOUNTER — Ambulatory Visit: Payer: BC Managed Care – PPO | Admitting: Occupational Therapy

## 2014-07-17 DIAGNOSIS — IMO0002 Reserved for concepts with insufficient information to code with codable children: Secondary | ICD-10-CM

## 2014-07-17 DIAGNOSIS — Z5189 Encounter for other specified aftercare: Secondary | ICD-10-CM | POA: Diagnosis not present

## 2014-07-17 DIAGNOSIS — M6281 Muscle weakness (generalized): Secondary | ICD-10-CM

## 2014-07-17 DIAGNOSIS — I69319 Unspecified symptoms and signs involving cognitive functions following cerebral infarction: Secondary | ICD-10-CM

## 2014-07-17 DIAGNOSIS — R41842 Visuospatial deficit: Secondary | ICD-10-CM

## 2014-07-17 NOTE — Therapy (Signed)
[facility] [address] [place], [place], [address] Phone: [phone]   Fax:  [phone]  Occupational Therapy Treatment  Patient Details  Name: Gregory Shields MRN: [id number] Date of Birth: [birth year]  Encounter Date: 07/17/2014      OT End of Session - 07/17/14 0824    Visit Number 11   Number of Visits 17   Date for OT Re-Evaluation 07/23/14   Authorization Type BCBS, 30 visit limit PT/OT   OT Start Time 0747   OT Stop Time 0833   OT Time Calculation (min) 46 min   Activity Tolerance Patient tolerated treatment well      Past Medical History  Diagnosis Date  . Depression   . Sleep apnea     Past Surgical History  Procedure Laterality Date  . None    . Left heart catheterization with coronary angiogram N/A 05/12/2014    Procedure: LEFT HEART CATHETERIZATION WITH CORONARY ANGIOGRAM;  Surgeon: Clent Demark, MD;  Location: [facility] CATH LAB;  Service: Cardiovascular;  Laterality: N/A;    There were no vitals taken for this visit.  Visit Diagnosis:  Cognitive deficits following cerebral infarction  Visuospatial deficit  Lack of coordination due to stroke  Generalized muscle weakness      Subjective Assessment - 07/17/14 0840    Symptoms Pt reports that he feels comfortable with discharge today/requests d/c with insurance benefits ending at end of year.  Pt reports that he misplaces things when distracted.  Pt reports that (novel divided attention) activites stress him out during session.   Currently in Pain? No/denies            OT Treatments/Exercises (OP) - 07/17/14 0001    ADLs   Work Pt instructed in compensation strategies for work tasks due to cognitive tasks (per reported difficulty and clinic observations)   ADL Comments Checked goals and discussed progress and remaining difficulties.  Informed pt that if he felt that deficits were interferring with work, not improving that he should discuss with MD.   Also,  verbally informed him of neuropsych cognitive testing services and that he could discuss with MD if he felt like it would be beneficial.  Pt verbalized understanding.   Cognitive Exercises   Attention Span Divided Ambulating with tossing a ball in each hand and performing category generation with min difficulty/cues (approx 90% accuracy with category generation).   Modified Multiple Elements Test for multi-tasking with good accuracy except that he went over time by 1.42mn.          OT Education - 07/17/14 [phone]   Education provided Yes   Education Details Cognitive compensation strategies specific to pt and reported difficulties/memory compensation strategies   Person(s) Educated Patient   Methods Explanation;Handout   Comprehension Verbalized understanding            OT Long Term Goals - 07/17/14 0815    OT LONG TERM GOAL #3   Title Pt will be able to complete complex functional problem-solving /planning tasks with at least 90% accuracy.   Status Achieved  at approx this level.  pt with increased difficulty with novel tasks.   OT LONG TERM GOAL #4   Title Pt will perform simple divided attention activities w/ at least 90% accuracy in prep for driving.   Status Achieved  at approx this level for simple-mod complex activities          Plan - 07/17/14 0835    Clinical Impression Statement Pt made  good progress, but continues to report/demo difficulty with distractions/divided attention and memory with distractions and novel tasks.  Pt instructed in compensation strategies and verbalized understanding and desire to discharge today.  FOTO Stroke IS Hand Function Score 100%.   Plan discharge OT                 OCCUPATIONAL THERAPY DISCHARGE SUMMARY  Current functional level related to goals / functional outcomes:  Pt met all STGs and met 3/4 LTGs.  Remaining LTG partially met possibly due to pre-existing condition per pt report.    Remaining deficits: Pt  with improved visual scanning/attention particularly to the L side.  Pt with improved divided attention, but continues to demo difficulty with higher level/complex and novel tasks and memory with distractions.   Education / Equipment: Pt instructed in CVA education, HEP, memory/cognitive compensation strategies, and neuropsych cognitive testing services if needed in the future (to discuss with physician prn).  Pt verbalized understanding of all education provided.  Plan: Patient agrees to discharge.  Patient goals were met. Patient is being discharged due to being pleased with the current functional level.  ?????                    Problem List Patient Active Problem List   Diagnosis Date Noted  . Cerebral infarction due to embolism of right middle cerebral artery 06/18/2014  . History of myocardial infarction less than 8 weeks 06/18/2014  . LV (left ventricular) mural thrombus 06/18/2014  . Atrial flutter, unspecified 06/18/2014  . Cerebral embolism with cerebral infarction 05/13/2014  . Acute anterolateral wall MI 05/12/2014    [facility] 07/17/2014, 8:57 AM  Vianne Bulls, OTR/L 07/17/2014 8:57 AM Phone: [phone] Fax: [phone]

## 2014-07-17 NOTE — Patient Instructions (Signed)

## 2014-07-18 ENCOUNTER — Encounter (HOSPITAL_COMMUNITY)
Admission: RE | Admit: 2014-07-18 | Discharge: 2014-07-18 | Disposition: A | Discharge status: Home / Self Care | Payer: BC Managed Care – PPO | Source: Ambulatory Visit | Attending: Cardiology | Admitting: Cardiology

## 2014-07-18 ENCOUNTER — Encounter (HOSPITAL_COMMUNITY): Payer: BC Managed Care – PPO

## 2014-07-18 DIAGNOSIS — Z5189 Encounter for other specified aftercare: Secondary | ICD-10-CM | POA: Diagnosis not present

## 2014-07-20 ENCOUNTER — Encounter (HOSPITAL_COMMUNITY): Payer: BC Managed Care – PPO

## 2014-07-23 ENCOUNTER — Encounter (HOSPITAL_COMMUNITY): Payer: Self-pay

## 2014-07-23 ENCOUNTER — Encounter (HOSPITAL_COMMUNITY)
Admission: RE | Admit: 2014-07-23 | Discharge: 2014-07-23 | Disposition: A | Discharge status: Home / Self Care | Payer: BC Managed Care – PPO | Source: Ambulatory Visit | Attending: Cardiology | Admitting: Cardiology

## 2014-07-23 DIAGNOSIS — Z5189 Encounter for other specified aftercare: Secondary | ICD-10-CM | POA: Diagnosis not present

## 2014-07-25 ENCOUNTER — Encounter (HOSPITAL_COMMUNITY): Payer: Self-pay | Admitting: *Deleted

## 2014-07-25 ENCOUNTER — Encounter (HOSPITAL_COMMUNITY)
Admission: RE | Admit: 2014-07-25 | Discharge: 2014-07-25 | Disposition: A | Discharge status: Home / Self Care | Payer: BC Managed Care – PPO | Source: Ambulatory Visit | Attending: Cardiology | Admitting: Cardiology

## 2014-07-25 NOTE — Progress Notes (Signed)
Pt graduated from cardiac rehab program today with completion of  exercise sessions in Phase II.  Pt exited program early due to insurance coverage changes for new year.   Pt maintained good attendance and progressed nicely during his participation in rehab as evidenced by increased MET level.   Medication list reconciled. Repeat  PHQ score-0.  Pt has made significant lifestyle changes and should be commended for his success. Pt feels he has achieved his goals during cardiac rehab with exception of his personal weight loss goal.  Pt plans to continue working towards losing weight.  Pt is pleased to return to work.   Pt plans to continue exercising on his own walking, biking.  Pt is eager to return to jogging.

## 2014-07-30 ENCOUNTER — Encounter (HOSPITAL_COMMUNITY)
Admission: RE | Admit: 2014-07-30 | Discharge: 2014-07-30 | Disposition: A | Discharge status: Home / Self Care | Payer: BC Managed Care – PPO | Source: Ambulatory Visit | Attending: Cardiology | Admitting: Cardiology

## 2014-07-30 DIAGNOSIS — Z5189 Encounter for other specified aftercare: Secondary | ICD-10-CM | POA: Diagnosis not present

## 2014-08-01 ENCOUNTER — Encounter (HOSPITAL_COMMUNITY)
Admission: RE | Admit: 2014-08-01 | Discharge: 2014-08-01 | Disposition: A | Discharge status: Home / Self Care | Payer: BC Managed Care – PPO | Source: Ambulatory Visit | Attending: Cardiology | Admitting: Cardiology

## 2014-08-01 DIAGNOSIS — Z5189 Encounter for other specified aftercare: Secondary | ICD-10-CM | POA: Diagnosis not present

## 2014-09-24 ENCOUNTER — Ambulatory Visit: Payer: BC Managed Care – PPO | Admitting: Neurology

## 2017-09-30 ENCOUNTER — Ambulatory Visit (INDEPENDENT_AMBULATORY_CARE_PROVIDER_SITE_OTHER): Payer: BLUE CROSS/BLUE SHIELD | Admitting: Cardiovascular Disease

## 2017-09-30 ENCOUNTER — Encounter: Payer: Self-pay | Admitting: Cardiovascular Disease

## 2017-09-30 ENCOUNTER — Encounter (INDEPENDENT_AMBULATORY_CARE_PROVIDER_SITE_OTHER): Payer: Self-pay

## 2017-09-30 VITALS — BP 154/86 | HR 74 | Ht 67.0 in | Wt 175.4 lb

## 2017-09-30 DIAGNOSIS — I481 Persistent atrial fibrillation: Secondary | ICD-10-CM

## 2017-09-30 DIAGNOSIS — I2510 Atherosclerotic heart disease of native coronary artery without angina pectoris: Secondary | ICD-10-CM

## 2017-09-30 DIAGNOSIS — I4819 Other persistent atrial fibrillation: Secondary | ICD-10-CM

## 2017-09-30 LAB — LIPID PANEL
Chol/HDL Ratio: 4 ratio (ref 0.0–5.0)
Cholesterol, Total: 207 mg/dL — ABNORMAL HIGH (ref 100–199)
HDL: 52 mg/dL (ref >39)
LDL CALC: 130 mg/dL — AB (ref 0–99)
Triglycerides: 124 mg/dL (ref 0–149)
VLDL CHOLESTEROL CAL: 25 mg/dL (ref 5–40)

## 2017-09-30 LAB — BASIC METABOLIC PANEL
BUN / CREAT RATIO: 13 (ref 10–24)
BUN: 14 mg/dL (ref 8–27)
CO2: 23 mmol/L (ref 20–29)
Calcium: 9 mg/dL (ref 8.6–10.2)
Chloride: 102 mmol/L (ref 96–106)
Creatinine, Ser: 1.12 mg/dL (ref 0.76–1.27)
GFR calc Af Amer: 80 mL/min/{1.73_m2} (ref >59)
GFR calc non Af Amer: 69 mL/min/{1.73_m2} (ref >59)
Glucose: 84 mg/dL (ref 65–99)
POTASSIUM: 4.5 mmol/L (ref 3.5–5.2)
SODIUM: 139 mmol/L (ref 134–144)

## 2017-09-30 LAB — HEPATIC FUNCTION PANEL
ALBUMIN: 4.2 g/dL (ref 3.6–4.8)
ALT: 13 IU/L (ref 0–44)
AST: 13 IU/L (ref 0–40)
Alkaline Phosphatase: 65 IU/L (ref 39–117)
Bilirubin Total: 0.7 mg/dL (ref 0.0–1.2)
Bilirubin, Direct: 0.15 mg/dL (ref 0.00–0.40)
TOTAL PROTEIN: 6.2 g/dL (ref 6.0–8.5)

## 2017-09-30 MED ORDER — ASPIRIN EC 81 MG PO TBEC: 81.0000 mg | DELAYED_RELEASE_TABLET | Freq: Every day | ORAL | Status: DC

## 2017-09-30 MED ORDER — NITROGLYCERIN 0.4 MG SL SUBL: 0.4000 mg | SUBLINGUAL_TABLET | SUBLINGUAL | 6 refills | Status: DC | PRN

## 2017-09-30 MED ORDER — ATORVASTATIN CALCIUM 80 MG PO TABS: 80.0000 mg | ORAL_TABLET | Freq: Every day | ORAL | 3 refills | Status: DC

## 2017-09-30 MED ORDER — CARVEDILOL 3.125 MG PO TABS: 3.1250 mg | ORAL_TABLET | Freq: Two times a day (BID) | ORAL | 3 refills | Status: DC

## 2017-09-30 MED ORDER — APIXABAN 5 MG PO TABS: 5.0000 mg | ORAL_TABLET | Freq: Two times a day (BID) | ORAL | 11 refills | Status: DC

## 2017-09-30 NOTE — Progress Notes (Signed)
Cardiology Office Note:    Date:  09/30/2017   ID:  Gregory Shields, DOB 27-May-1951, MRN [id number]  PCP:  Jones Bales, MD  Cardiologist:  No primary care provider on file.   Referring MD: Jones Bales, MD    Problem List  1. CAD - s/p Ant. MI 2. CHF 3.  Paroxysmal  Atrial Fib /  flutter -  CHADS2VASC =  3 ( CAD, CVA), associated with Ant MI.  4. CVA  Chief Complaint  Patient presents with  . Coronary Artery Disease  . Congestive Heart Failure    History of Present Illness:    Gregory Shields is a 67 y.o. male with a hx of Ant. MI followed by a CVA a week later in 2015.   Has recovered nicely . Mild  neurologic deficits on left side.   ( hes left handed)  Had PAF around the time of the MI / CVA  Has had atrial fib for the past several years.   Was on amiodarone but has stopped that and all of his meds.  Was on Atrova, Lisinopril, Coreg. Eliquis  Still taking ASA 81 mg a day  Had some balance issues several weeks ago.  Lasted for a day .   vomitted tiwce. Has resolved .   Feels well now Echo about 6 months ago - looked ok   No CP or dyspnea.   He is transfering care from Dr. Sharyn Lull.   Past Medical History:  Diagnosis Date  . Depression   . Sleep apnea     Past Surgical History:  Procedure Laterality Date  . LEFT HEART CATHETERIZATION WITH CORONARY ANGIOGRAM N/A 05/12/2014   Procedure: LEFT HEART CATHETERIZATION WITH CORONARY ANGIOGRAM;  Surgeon: Robynn Pane, MD;  Location: [facility] CATH LAB;  Service: Cardiovascular;  Laterality: N/A;    Current Medications: Current Meds  Medication Sig  . aspirin 81 MG tablet Take 81 mg by mouth 4 (four) times daily as needed for pain or fever.     Allergies:   Penicillins   Social History   Socioeconomic History  . Marital status: Unknown    Spouse name: None  . Number of children: 0  . Years of education: 63  . Highest education level: None  Social Needs  . Financial resource strain: None  . Food  insecurity - worry: None  . Food insecurity - inability: None  . Transportation needs - medical: None  . Transportation needs - non-medical: None  Occupational History  . None  Tobacco Use  . Smoking status: Never Smoker  . Smokeless tobacco: Never Used  Substance and Sexual Activity  . Alcohol use: Yes    Comment: OCCASION  . Drug use: No  . Sexual activity: Yes  Other Topics Concern  . None  Social History Narrative   Patient is married with 2 adopted children.   Patient is left handed.   Patient has 16 yrs of education.   Patient drinks 2 cups daily.     Family History: The patient's family history includes Heart attack in his father.  ROS:   Please see the history of present illness.    All other systems reviewed and are negative.  EKGs/Labs/Other Studies Reviewed:    The following studies were reviewed today: Previous cath, echo , labs.   EKG:  Feb. 28, 2019:   Atrial fib .  HR is 74.   Old ANT. Septal MI .   Recent Labs: No results found for requested  labs within last 8760 hours.  Recent Lipid Panel    Component Value Date/Time   CHOL 126 05/14/2014 0308   TRIG 90 05/14/2014 0308   HDL 28 (L) 05/14/2014 0308   CHOLHDL 4.5 05/14/2014 0308   VLDL 18 05/14/2014 0308   LDLCALC 80 05/14/2014 0308    Physical Exam:    VS:  BP (!) 154/86   Pulse 74   Ht 5\' 7"  (1.702 m)   Wt 175 lb 6.4 oz (79.6 kg)   BMI 27.47 kg/m     Wt Readings from Last 3 Encounters:  09/30/17 175 lb 6.4 oz (79.6 kg)  06/18/14 169 lb 3.2 oz (76.7 kg)  06/07/14 168 lb 14 oz (76.6 kg)     GEN:  Well nourished, well developed in no acute distress HEENT: Normal NECK: No JVD; No carotid bruits LYMPHATICS: No lymphadenopathy CARDIAC: RRR, no murmurs, rubs, gallops RESPIRATORY:  Clear to auscultation without rales, wheezing or rhonchi  ABDOMEN: Soft, non-tender, non-distended MUSCULOSKELETAL:  No edema; No deformity  SKIN: Warm and dry NEUROLOGIC:  Alert and oriented x  3 PSYCHIATRIC:  Normal affect   ASSESSMENT:    No diagnosis found. PLAN:    In order of problems listed above:  1. Coronary artery disease-status post large anterior wall myocardial infarction.  This was complicated by an apical thrombus and CVA.    Not had any further episodes of chest discomfort. I would like for him to continue ASA 81 mg along with the eliquis    2.  Atrial fibrillation: He remains in atrial fibrillation.  He is tried amiodarone but did not convert to sinus rhythm.  He has stopped his Eliquis. The Eliquis was fairly expensive so   it was recommended that he start warfarin.  He did not start the warfarin.   Will restart Eliquis ( patient assistance program)   3.  Chronic systolic congestive heart failure: Restart coreg.  Will consider Lisiopril . He will see if he can get the images   4.  Hyperlipidemia:    We discussed the importance of starting statins.  We discussed the fact that there very important in preventing second heart attacks and strokes.  Medication Adjustments/Labs and Tests Ordered: Current medicines are reviewed at length with the patient today.  Concerns regarding medicines are outlined above.  No orders of the defined types were placed in this encounter.  No orders of the defined types were placed in this encounter.   Signed, Kristeen MissPhilip Nahser, MD  09/30/2017 10:48 AM    [facility]

## 2017-09-30 NOTE — Patient Instructions (Addendum)
Medication Instructions:  Your physician has recommended you make the following change in your medication:   START Atorvastatin (Lipitor) 80 mg once daily START Carvedilol (Coreg) 3.125 mg twice daily START Eliquis 5 mg (Apixaban) 5 mg twice daily   Labwork: TODAY - cholesterol, liver panel, basic metabolic panel   Testing/Procedures: None Ordered   Follow-Up: Your physician recommends that you schedule a follow-up appointment in: 3 months with Dr. Elease HashimotoNahser   If you need a refill on your cardiac medications before your next appointment, please call your pharmacy.   Thank you for choosing [facility]! Eligha BridegroomMichelle Swinyer, RN [phone]

## 2017-12-28 ENCOUNTER — Encounter (INDEPENDENT_AMBULATORY_CARE_PROVIDER_SITE_OTHER): Payer: Self-pay

## 2017-12-28 ENCOUNTER — Ambulatory Visit: Payer: BLUE CROSS/BLUE SHIELD | Admitting: Cardiovascular Disease

## 2017-12-28 ENCOUNTER — Encounter: Payer: Self-pay | Admitting: Cardiovascular Disease

## 2017-12-28 VITALS — BP 142/80 | HR 93 | Ht 67.0 in | Wt 173.0 lb

## 2017-12-28 DIAGNOSIS — E782 Mixed hyperlipidemia: Secondary | ICD-10-CM | POA: Diagnosis not present

## 2017-12-28 DIAGNOSIS — I2510 Atherosclerotic heart disease of native coronary artery without angina pectoris: Secondary | ICD-10-CM | POA: Diagnosis not present

## 2017-12-28 DIAGNOSIS — I5042 Chronic combined systolic (congestive) and diastolic (congestive) heart failure: Secondary | ICD-10-CM

## 2017-12-28 MED ORDER — CARVEDILOL 6.25 MG PO TABS: 6.2500 mg | ORAL_TABLET | Freq: Two times a day (BID) | ORAL | 3 refills | Status: DC

## 2017-12-28 NOTE — Progress Notes (Signed)
Cardiology Office Note:    Date:  12/28/2017   ID:  Gregory Shields, DOB 03/06/1951, MRN [id number]  PCP:  Knox Royalty, MD  Cardiologist:  Kristeen Miss, MD   Referring MD: Jones Bales, MD    Problem List  1. CAD - s/p Ant. MI 2. CHF 3.  Paroxysmal  Atrial Fib /  flutter -  CHADS2VASC =  3 ( CAD, CVA), associated with Ant MI.  4. CVA  Chief Complaint  Patient presents with  . Coronary Artery Disease    History of Present Illness:    Gregory Shields is a 67 y.o. male with a hx of Ant. MI followed by a CVA a week later in 2015.   Has recovered nicely . Mild  neurologic deficits on left side.   ( hes left handed)  Had PAF around the time of the MI / CVA  Has had atrial fib for the past several years.   Was on amiodarone but has stopped that and all of his meds.  Was on Atrova, Lisinopril, Coreg. Eliquis  Still taking ASA 81 mg a day  Had some balance issues several weeks ago.  Lasted for a day .   vomitted tiwce. Has resolved .   Feels well now Echo about 6 months ago - looked ok   No CP or dyspnea.   He is transfering care from Dr. Sharyn Lull.  Dec 28, 2017:  Gregory Shields is seen today for follow-up of his coronary artery disease, chronic combined systolic and diastolic congestive heart failure, atrial fibrillation. Since his last visit he is started back on atorvastatin 80 mg a day and Eliquis 5 mg twice a day. Doing well on meds.  Energy is great.    Energy levels are great.    Past Medical History:  Diagnosis Date  . Depression   . Sleep apnea     Past Surgical History:  Procedure Laterality Date  . LEFT HEART CATHETERIZATION WITH CORONARY ANGIOGRAM N/A 05/12/2014   Procedure: LEFT HEART CATHETERIZATION WITH CORONARY ANGIOGRAM;  Surgeon: Robynn Pane, MD;  Location: [facility] CATH LAB;  Service: Cardiovascular;  Laterality: N/A;    Current Medications: Current Meds  Medication Sig  . apixaban (ELIQUIS) 5 MG TABS tablet Take 1 tablet (5 mg total) by mouth 2 (two)  times daily.  Marland Kitchen aspirin EC 81 MG tablet Take 1 tablet (81 mg total) by mouth daily.  Marland Kitchen atorvastatin (LIPITOR) 80 MG tablet Take 1 tablet (80 mg total) by mouth daily.  . nitroGLYCERIN (NITROSTAT) 0.4 MG SL tablet Place 1 tablet (0.4 mg total) under the tongue every 5 (five) minutes as needed for chest pain.  . [DISCONTINUED] carvedilol (COREG) 3.125 MG tablet Take 1 tablet (3.125 mg total) by mouth 2 (two) times daily.     Allergies:   Penicillins   Social History   Socioeconomic History  . Marital status: Unknown    Spouse name: Not on file  . Number of children: 0  . Years of education: 6  . Highest education level: Not on file  Occupational History  . Not on file  Social Needs  . Financial resource strain: Not on file  . Food insecurity:    Worry: Not on file    Inability: Not on file  . Transportation needs:    Medical: Not on file    Non-medical: Not on file  Tobacco Use  . Smoking status: Never Smoker  . Smokeless tobacco: Never Used  Substance and Sexual Activity  .  Alcohol use: Yes    Comment: OCCASION  . Drug use: No  . Sexual activity: Yes  Lifestyle  . Physical activity:    Days per week: Not on file    Minutes per session: Not on file  . Stress: Not on file  Relationships  . Social connections:    Talks on phone: Not on file    Gets together: Not on file    Attends religious service: Not on file    Active member of club or organization: Not on file    Attends meetings of clubs or organizations: Not on file    Relationship status: Not on file  Other Topics Concern  . Not on file  Social History Narrative   Patient is married with 2 adopted children.   Patient is left handed.   Patient has 16 yrs of education.   Patient drinks 2 cups daily.     Family History: The patient's family history includes Heart attack in his father.  ROS:   Please see the history of present illness.    All other systems reviewed and are negative.  EKGs/Labs/Other  Studies Reviewed:      EKG:      Recent Labs: 09/30/2017: ALT 13; BUN 14; Creatinine, Ser 1.12; Potassium 4.5; Sodium 139  Recent Lipid Panel    Component Value Date/Time   CHOL 207 (H) 09/30/2017 1112   TRIG 124 09/30/2017 1112   HDL 52 09/30/2017 1112   CHOLHDL 4.0 09/30/2017 1112   CHOLHDL 4.5 05/14/2014 0308   VLDL 18 05/14/2014 0308   LDLCALC 130 (H) 09/30/2017 1112    Physical Exam:    Physical Exam: Blood pressure (!) 142/80, pulse 93, height  (1.702 m), weight 173 lb (78.5 kg), SpO2 98 %.  GEN:  Well nourished, well developed in no acute distress HEENT: Normal NECK: No JVD; No carotid bruits LYMPHATICS: No lymphadenopathy CARDIAC: irreg. Irreg.  RESPIRATORY:  Clear to auscultation without rales, wheezing or rhonchi  ABDOMEN: Soft, non-tender, non-distended MUSCULOSKELETAL:  No edema; No deformity  SKIN: Warm and dry NEUROLOGIC:  Alert and oriented x 3   ASSESSMENT:    1. Coronary artery disease involving native coronary artery of native heart without angina pectoris   2. Chronic combined systolic and diastolic CHF (congestive heart failure) (HCC)   3. Mixed hyperlipidemia    PLAN:     Coronary artery disease- Ashely is not had any further episodes of angina.  Continue current medications.   2.  Atrial fibrillation:   His atrial fibrillation remained stable.  He is on Eliquis 5 mg twice a day.  He is completely asymptomatic.  I think our best strategy is for rate control and anticoagulation.  3.  Chronic systolic congestive heart failure: We will increase carvedilol to 6.25 mg twice a day.  By his report, his ejection fraction had improved from the 30 range to perhaps 60 range.  He will look into checking with his insurance company to see how much an echocardiogram cost.  We will plan on repeating his echocardiogram sometime later this year or perhaps next year.    4.  Hyperlipidemia:   Check fasting labs today.   Medication Adjustments/Labs and Tests  Ordered: Current medicines are reviewed at length with the patient today.  Concerns regarding medicines are outlined above.  Orders Placed This Encounter  Procedures  . Basic Metabolic Panel (BMET)  . Hepatic function panel  . Lipid Profile   Meds ordered this encounter  Medications  . carvedilol (COREG) 6.25 MG tablet    Sig: Take 1 tablet (6.25 mg total) by mouth 2 (two) times daily.    Dispense:  180 tablet    Refill:  3     Signed, Kristeen Miss, MD  12/28/2017 5:22 PM    [facility]

## 2017-12-28 NOTE — Patient Instructions (Signed)
Medication Instructions:  Your physician has recommended you make the following change in your medication:   INCREASE Carvedilol (Coreg) to 6.25 mg twice daily   Labwork: TODAY - cholesterol, liver panel, hepatic function panel   Testing/Procedures: None Ordered  **I have sent a message to Billing to find out the price of an echo for you  Follow-Up: Your physician wants you to follow-up in: 6 months with Dr. Elease Hashimoto. You will receive a reminder letter in the mail two months in advance. If you don't receive a letter, please call our office to schedule the follow-up appointment.   If you need a refill on your cardiac medications before your next appointment, please call your pharmacy.   Thank you for choosing [facility]! Eligha Bridegroom, RN [phone]

## 2017-12-29 LAB — BASIC METABOLIC PANEL
BUN/Creatinine Ratio: 16 (ref 10–24)
BUN: 15 mg/dL (ref 8–27)
CO2: 21 mmol/L (ref 20–29)
CREATININE: 0.95 mg/dL (ref 0.76–1.27)
Calcium: 9.1 mg/dL (ref 8.6–10.2)
Chloride: 102 mmol/L (ref 96–106)
GFR calc Af Amer: 97 mL/min/{1.73_m2} (ref >59)
GFR calc non Af Amer: 84 mL/min/{1.73_m2} (ref >59)
Glucose: 88 mg/dL (ref 65–99)
Potassium: 4.2 mmol/L (ref 3.5–5.2)
SODIUM: 141 mmol/L (ref 134–144)

## 2017-12-29 LAB — LIPID PANEL
CHOLESTEROL TOTAL: 139 mg/dL (ref 100–199)
Chol/HDL Ratio: 2.6 ratio (ref 0.0–5.0)
HDL: 54 mg/dL (ref >39)
LDL CALC: 71 mg/dL (ref 0–99)
TRIGLYCERIDES: 70 mg/dL (ref 0–149)
VLDL Cholesterol Cal: 14 mg/dL (ref 5–40)

## 2017-12-29 LAB — HEPATIC FUNCTION PANEL
ALK PHOS: 69 IU/L (ref 39–117)
ALT: 22 IU/L (ref 0–44)
AST: 23 IU/L (ref 0–40)
Albumin: 4.5 g/dL (ref 3.6–4.8)
Bilirubin Total: 0.9 mg/dL (ref 0.0–1.2)
Bilirubin, Direct: 0.3 mg/dL (ref 0.00–0.40)
TOTAL PROTEIN: 6.4 g/dL (ref 6.0–8.5)

## 2018-06-16 ENCOUNTER — Encounter: Payer: Self-pay | Admitting: Cardiovascular Disease

## 2018-07-06 ENCOUNTER — Encounter: Payer: Self-pay | Admitting: Cardiovascular Disease

## 2018-07-06 ENCOUNTER — Ambulatory Visit (INDEPENDENT_AMBULATORY_CARE_PROVIDER_SITE_OTHER): Payer: Medicare HMO | Admitting: Cardiovascular Disease

## 2018-07-06 VITALS — BP 132/84 | HR 92 | Ht 67.0 in | Wt 175.0 lb

## 2018-07-06 DIAGNOSIS — I2510 Atherosclerotic heart disease of native coronary artery without angina pectoris: Secondary | ICD-10-CM

## 2018-07-06 DIAGNOSIS — I5022 Chronic systolic (congestive) heart failure: Secondary | ICD-10-CM

## 2018-07-06 MED ORDER — CARVEDILOL 3.125 MG PO TABS: 3.1250 mg | ORAL_TABLET | Freq: Two times a day (BID) | ORAL | 11 refills | Status: DC

## 2018-07-06 NOTE — Patient Instructions (Signed)
Medication Instructions:  Your physician has recommended you make the following change in your medication:   DECREASE Carvedilol (Coreg) to 3.125 mg twice daily  If you need a refill on your cardiac medications before your next appointment, please call your pharmacy.   Lab work: Your physician recommends that you return for lab work in: 6 months on the day of or a few days before your office visit with Dr. Elease HashimotoNahser.  You will need to FAST for this appointment - nothing to eat or drink after midnight the night before except water.   If you have labs (blood work) drawn today and your tests are completely normal, you will receive your results only by: Marland Kitchen. MyChart Message (if you have MyChart) OR . A paper copy in the mail If you have any lab test that is abnormal or we need to change your treatment, we will call you to review the results.   Testing/Procedures: Your physician has requested that you have an echocardiogram. Echocardiography is a painless test that uses sound waves to create images of your heart. It provides your doctor with information about the size and shape of your heart and how well your heart's chambers and valves are working. This procedure takes approximately one hour. There are no restrictions for this procedure.    Follow-Up: At [facility], you and your health needs are our priority.  As part of our continuing mission to provide you with exceptional heart care, we have created designated Provider Care Teams.  These Care Teams include your primary Cardiologist (physician) and Advanced Practice Providers (APPs -  Physician Assistants and Nurse Practitioners) who all work together to provide you with the care you need, when you need it. You will need a follow up appointment in:  6 months.  Please call our office 2 months in advance to schedule this appointment.  You may see Kristeen MissPhilip Nahser, MD or one of the following Advanced Practice Providers on your designated Care  Team: Tereso NewcomerScott Weaver, PA-C Vin FruitdaleBhagat, [place] JerseyPA-C . Berton BonJanine Hammond, NP

## 2018-07-06 NOTE — Progress Notes (Signed)
Cardiology Office Note:    Date:  07/06/2018   ID:  Gregory Shields, DOB 1951/02/03, MRN [id number]  PCP:  Gregory Royalty, MD  Cardiologist:  Gregory Miss, MD   Referring MD: Gregory Royalty, MD    Problem List  1. CAD - s/p Ant. MI 2. CHF 3.  Paroxysmal  Atrial Fib /  flutter -  CHADS2VASC =  3 ( CAD, CVA), associated with Ant MI.  4. CVA  Chief Complaint  Patient presents with  . Coronary Artery Disease  . Congestive Heart Failure  . Atrial Fibrillation     Gregory Shields is a 67 y.o. male with a hx of Ant. MI followed by a CVA a week later in 2015.   Has recovered nicely . Mild  neurologic deficits on left side.   ( hes left handed)  Had PAF around the time of the MI / CVA  Has had atrial fib for the past several years.   Was on amiodarone but has stopped that and all of his meds.  Was on Atrova, Lisinopril, Coreg. Eliquis  Still taking ASA 81 mg a day  Had some balance issues several weeks ago.  Lasted for a day .   vomitted tiwce. Has resolved .   Feels well now Echo about 6 months ago - looked ok   No CP or dyspnea.   He is transfering care from Dr. Sharyn Shields.  Dec 28, 2017:  Gregory Shields is seen today for follow-up of his coronary artery disease, chronic combined systolic and diastolic congestive heart failure, atrial fibrillation. Since his last visit he is started back on atorvastatin 80 mg a day and Eliquis 5 mg twice a day. Doing well on meds.  Energy is great.    Energy levels are great.    July 06, 2018: Today for follow-up of his coronary artery disease.  He is status post anterior wall myocardial infarction and had combined systolic and diastolic congestive heart failure. Has had some atrial fibrillation and is on Eliquis. He has stopped his Coreg 6. 25  due to fatigue.  Rode his mountain bike quite [place] bit [place] [employer] [place] .    Now is walking  No CP with exercise    Past Medical History:  Diagnosis Date  . Depression   . Sleep apnea      Past Surgical History:  Procedure Laterality Date  . LEFT HEART CATHETERIZATION WITH CORONARY ANGIOGRAM N/A 05/12/2014   Procedure: LEFT HEART CATHETERIZATION WITH CORONARY ANGIOGRAM;  Surgeon: Gregory Pane, MD;  Location: [facility] CATH LAB;  Service: Cardiovascular;  Laterality: N/A;    Current Medications: Current Meds  Medication Sig  . apixaban (ELIQUIS) 5 MG TABS tablet Take 1 tablet (5 mg total) by mouth 2 (two) times daily.  Marland Kitchen aspirin EC 81 MG tablet Take 1 tablet (81 mg total) by mouth daily.  Marland Kitchen atorvastatin (LIPITOR) 80 MG tablet Take 1 tablet (80 mg total) by mouth daily.  . nitroGLYCERIN (NITROSTAT) 0.4 MG SL tablet Place 1 tablet (0.4 mg total) under the tongue every 5 (five) minutes as needed for chest pain.     Allergies:   Penicillins   Social History   Socioeconomic History  . Marital status: Unknown    Spouse name: Not on file  . Number of children: 0  . Years of education: 1  . Highest education level: Not on file  Occupational History  . Not on file  Social Needs  . Financial resource strain:  Not on file  . Food insecurity:    Worry: Not on file    Inability: Not on file  . Transportation needs:    Medical: Not on file    Non-medical: Not on file  Tobacco Use  . Smoking status: Never Smoker  . Smokeless tobacco: Never Used  Substance and Sexual Activity  . Alcohol use: Yes    Comment: OCCASION  . Drug use: No  . Sexual activity: Yes  Lifestyle  . Physical activity:    Days per week: Not on file    Minutes per session: Not on file  . Stress: Not on file  Relationships  . Social connections:    Talks on phone: Not on file    Gets together: Not on file    Attends religious service: Not on file    Active member of club or organization: Not on file    Attends meetings of clubs or organizations: Not on file    Relationship status: Not on file  Other Topics Concern  . Not on file  Social History Narrative   Patient is married with 2 adopted  children.   Patient is left handed.   Patient has 16 yrs of education.   Patient drinks 2 cups daily.     Family History: The patient's family history includes Heart attack in his father.  ROS:   Please see the history of present illness.    All other systems reviewed and are negative.  EKGs/Labs/Other Studies Reviewed:      EKG:      Recent Labs: 12/28/2017: ALT 22; BUN 15; Creatinine, Ser 0.95; Potassium 4.2; Sodium 141  Recent Lipid Panel    Component Value Date/Time   CHOL 139 12/28/2017 1635   TRIG 70 12/28/2017 1635   HDL 54 12/28/2017 1635   CHOLHDL 2.6 12/28/2017 1635   CHOLHDL 4.5 05/14/2014 0308   VLDL 18 05/14/2014 0308   LDLCALC 71 12/28/2017 1635    Physical Exam:     Physical Exam: Blood pressure 132/84, pulse 92, height 5\' 7"  (1.702 m), weight 175 lb (79.4 kg), SpO2 98 %.  GEN:  Well nourished, well developed in no acute distress HEENT: Normal NECK: No JVD; No carotid bruits LYMPHATICS: No lymphadenopathy CARDIAC: RR,   RESPIRATORY:  Clear to auscultation without rales, wheezing or rhonchi  ABDOMEN: Soft, non-tender, non-distended MUSCULOSKELETAL:  No edema; No deformity  SKIN: Warm and dry NEUROLOGIC:  Alert and oriented x 3   ASSESSMENT:    1. Coronary artery disease involving native coronary artery of native heart without angina pectoris   2. Chronic systolic heart failure (HCC)    PLAN:     1.,.  Coronary artery disease-    S/p ant. MI No angina  Still has a mod. Reduced EF by last echo Will repeat echo .    2.  Atrial fibrillation:    Stable,  On eliquis   3.  Chronic systolic congestive heart failure:   Repeat echo.   Restart coreg at 3.125 BID   4.  Hyperlipidemia:       Medication Adjustments/Labs and Tests Ordered: Current medicines are reviewed at length with the patient today.  Concerns regarding medicines are outlined above.  Orders Placed This Encounter  Procedures  . Lipid Profile  . Basic Metabolic Panel (BMET)    . Hepatic function panel  . ECHOCARDIOGRAM COMPLETE   Meds ordered this encounter  Medications  . carvedilol (COREG) 3.125 MG tablet    Sig:  Take 1 tablet (3.125 mg total) by mouth 2 (two) times daily.    Dispense:  60 tablet    Refill:  11      Signed, Gregory MissPhilip , MD  07/06/2018 6:41 PM    [facility]

## 2018-11-29 ENCOUNTER — Encounter: Payer: Self-pay | Admitting: Nurse Practitioner

## 2018-11-29 ENCOUNTER — Telehealth: Payer: Self-pay | Admitting: Cardiovascular Disease

## 2018-11-29 ENCOUNTER — Encounter: Payer: Self-pay | Admitting: Cardiovascular Disease

## 2018-11-29 NOTE — Telephone Encounter (Signed)
Gregory Shields called and requested a letter  He has multiple cardiac risk factors and Ive advised him to not work for the next 3 months due to his increased risk of COVID 19.  Letter is under the Letter tab in epic Please print and I will run by and sign.  email to [email or web address]  Thanks   Delane Ginger , MD

## 2018-11-29 NOTE — Telephone Encounter (Signed)
New Message:    Pt says he needs a note for work. It needs to  state that he has a heart condition and he is unable to work outside of his home at this time,because of COVID-19.

## 2020-04-05 ENCOUNTER — Encounter: Payer: Self-pay | Admitting: Cardiovascular Disease

## 2020-04-10 ENCOUNTER — Telehealth: Payer: Self-pay | Admitting: Cardiovascular Disease

## 2020-04-10 DIAGNOSIS — I5022 Chronic systolic (congestive) heart failure: Secondary | ICD-10-CM

## 2020-04-10 DIAGNOSIS — I2510 Atherosclerotic heart disease of native coronary artery without angina pectoris: Secondary | ICD-10-CM

## 2020-04-10 NOTE — Telephone Encounter (Signed)
    Pt would like to get echo before seeing Dr. Elease Hashimoto on 05/17/20

## 2020-04-10 NOTE — Telephone Encounter (Signed)
Dr. Elease Hashimoto, pt is calling in asking if he should have an echo done prior to seeing you on 05/17/20.  Pt last seen on 07/06/2018 where an echo was ordered, but pt never had this done.  Please advise if you would like the pt to have an echo done prior to seeing you, or wait until his office visit. Pt is aware a triage nurse will follow-up with him accordingly thereafter.

## 2020-04-10 NOTE — Telephone Encounter (Signed)
Yes, lets please order an echo

## 2020-04-10 NOTE — Telephone Encounter (Signed)
Echo order placed and message sent to [facility], to call the pt back, and arrange this test to be done prior to his OV with Dr. Melburn Popper on 05/17/20.  Pt made aware of this and agrees with this plan.

## 2020-04-11 NOTE — Telephone Encounter (Signed)
RE: echo prior to OV with Dr. Elease Hashimoto on 05/17/20 per Dr. Elease Hashimoto Received: Today Gregory Sender, LPN Patient is scheduled for 04/22/2020. I have spoken with him and he is aware of appt. Have a great day!

## 2020-04-17 NOTE — Telephone Encounter (Signed)
Created in error. Reginaldo Hazard, OTR/L 

## 2020-04-22 ENCOUNTER — Ambulatory Visit (HOSPITAL_COMMUNITY): Payer: Medicare HMO | Attending: Cardiology

## 2020-04-22 ENCOUNTER — Other Ambulatory Visit: Payer: Self-pay

## 2020-04-22 DIAGNOSIS — I5022 Chronic systolic (congestive) heart failure: Secondary | ICD-10-CM | POA: Diagnosis present

## 2020-04-22 DIAGNOSIS — I2510 Atherosclerotic heart disease of native coronary artery without angina pectoris: Secondary | ICD-10-CM | POA: Diagnosis present

## 2020-04-22 LAB — ECHOCARDIOGRAM COMPLETE
Area-P 1/2: 3.27 cm2
S' Lateral: 3 cm

## 2020-05-17 ENCOUNTER — Ambulatory Visit: Payer: Medicare HMO | Admitting: Cardiovascular Disease

## 2020-06-19 ENCOUNTER — Encounter: Payer: Self-pay | Admitting: Cardiovascular Disease

## 2020-06-19 NOTE — Progress Notes (Signed)
Cardiology Office Note:    Date:  06/21/2020   ID:  Gregory Shields, DOB November 09, 1950, MRN [id number]  PCP:  Gregory Royalty, MD  Cardiologist:  Gregory Miss, MD   Referring MD: Gregory Royalty, MD    Problem List  1. CAD - s/p Ant. MI 2. CHF 3.  Paroxysmal  Atrial Fib /  flutter -  CHADS2VASC =  3 ( CAD, CVA), associated with Ant MI.  4. CVA,  TIA Oct. 2021 ( while off Eliquis, following Covid booster )   Chief Complaint  Patient presents with  . Coronary Artery Disease     Gregory Shields is a 69 y.o. male with a hx of Ant. MI followed by a CVA a week later in 2015.   Has recovered nicely . Mild  neurologic deficits on left side.   ( hes left handed)  Had PAF around the time of the MI / CVA  Has had atrial fib for the past several years.   Was on amiodarone but has stopped that and all of his meds.  Was on Atrova, Lisinopril, Coreg. Eliquis  Still taking ASA 81 mg a day  Had some balance issues several weeks ago.  Lasted for a day .   vomitted tiwce. Has resolved .   Feels well now Echo about 6 months ago - looked ok   No CP or dyspnea.   He is transfering care from Dr. Sharyn Shields.  Dec 28, 2017:  Gregory Shields is seen today for follow-up of his coronary artery disease, chronic combined systolic and diastolic congestive heart failure, atrial fibrillation. Since his last visit he is started back on atorvastatin 80 mg a day and Eliquis 5 mg twice a day. Doing well on meds.  Energy is great.    Energy levels are great.    July 06, 2018: Today for follow-up of his coronary artery disease.  He is status post anterior wall myocardial infarction and had combined systolic and diastolic congestive heart failure. Has had some atrial fibrillation and is on Eliquis. He has stopped his Coreg 6. 25  due to fatigue.  Rode his mountain bike quite a bit this [place] at [employer] [place] .    Now is walking  No CP with exercise   Nov. 18, 2021: Gregory Shields is seen today for follow up of his CAD, Atrial  fib He has mild LV dysfunction with EF of 40-45%, apical akinesis Unchanged from previous Echo  Last month, he had his 3rd covid booster. 2-3 days later he suddenly Felt achy,  Had a presyncopal episode  Was foaming at the mouth. Drooling .   Does not think this was a TIA  Called EMS  Lasted 30 minutes or so .    Felt foggy for several weeks.  Is feeling better at this point.   Is not taking his Eliquis for his atrial fib - stopped about a year ago  Is only taking ASA  No angina   Past Medical History:  Diagnosis Date  . Depression   . Sleep apnea     Past Surgical History:  Procedure Laterality Date  . LEFT HEART CATHETERIZATION WITH CORONARY ANGIOGRAM N/A 05/12/2014   Procedure: LEFT HEART CATHETERIZATION WITH CORONARY ANGIOGRAM;  Surgeon: Gregory Pane, MD;  Location: [facility] CATH LAB;  Service: Cardiovascular;  Laterality: N/A;    Current Medications: Current Meds  Medication Sig  . aspirin EC 81 MG tablet Take 1 tablet (81 mg total) by mouth daily.  . nitroGLYCERIN (NITROSTAT)  0.4 MG SL tablet Place 1 tablet (0.4 mg total) under the tongue every 5 (five) minutes as needed for chest pain.  . [DISCONTINUED] apixaban (ELIQUIS) 5 MG TABS tablet Take 1 tablet (5 mg total) by mouth 2 (two) times daily.  . [DISCONTINUED] atorvastatin (LIPITOR) 80 MG tablet Take 1 tablet (80 mg total) by mouth daily.  . [DISCONTINUED] carvedilol (COREG) 3.125 MG tablet Take 1 tablet (3.125 mg total) by mouth 2 (two) times daily.     Allergies:   Penicillins   Social History   Socioeconomic History  . Marital status: Unknown    Spouse name: Not on file  . Number of children: 0  . Years of education: 4  . Highest education level: Not on file  Occupational History  . Not on file  Tobacco Use  . Smoking status: Never Smoker  . Smokeless tobacco: Never Used  Vaping Use  . Vaping Use: Never used  Substance and Sexual Activity  . Alcohol use: Yes    Comment: OCCASION  . Drug use: No  .  Sexual activity: Yes  Other Topics Concern  . Not on file  Social History Narrative   Patient is married with 2 adopted children.   Patient is left handed.   Patient has 16 yrs of education.   Patient drinks 2 cups daily.   Social Determinants of Health   Financial Resource Strain:   . Difficulty of Paying Living Expenses: Not on file  Food Insecurity:   . Worried About Programme researcher, broadcasting/film/video in the Last Year: Not on file  . Ran Out of Food in the Last Year: Not on file  Transportation Needs:   . Lack of Transportation (Medical): Not on file  . Lack of Transportation (Non-Medical): Not on file  Physical Activity:   . Days of Exercise per Week: Not on file  . Minutes of Exercise per Session: Not on file  Stress:   . Feeling of Stress : Not on file  Social Connections:   . Frequency of Communication with Friends and Family: Not on file  . Frequency of Social Gatherings with Friends and Family: Not on file  . Attends Religious Services: Not on file  . Active Member of Clubs or Organizations: Not on file  . Attends Banker Meetings: Not on file  . Marital Status: Not on file     Family History: The patient's family history includes Heart attack in his father.  ROS:   Please see the history of present illness.    All other systems reviewed and are negative.  EKGs/Labs/Other Studies Reviewed:      EKG:    #19, 2021: Atrial fibrillation with controlled ventricular rate. Previous anterior wall anterior MI.  Recent Labs: No results found for requested labs within last 8760 hours.  Recent Lipid Panel    Component Value Date/Time   CHOL 139 12/28/2017 1635   TRIG 70 12/28/2017 1635   HDL 54 12/28/2017 1635   CHOLHDL 2.6 12/28/2017 1635   CHOLHDL 4.5 05/14/2014 0308   VLDL 18 05/14/2014 0308   LDLCALC 71 12/28/2017 1635    Physical Exam:    Physical Exam: Blood pressure 140/80, pulse 70, height 5\' 8"  (1.727 m), weight 172 lb 9.6 oz (78.3 kg), SpO2 98  %.  GEN:  Well nourished, well developed in no acute distress HEENT: Normal NECK: No JVD; No carotid bruits LYMPHATICS: No lymphadenopathy CARDIAC: Irreeg.  Irreg.  RESPIRATORY:  Clear to auscultation without rales,  wheezing or rhonchi  ABDOMEN: Soft, non-tender, non-distended MUSCULOSKELETAL:  No edema; No deformity  SKIN: Warm and dry NEUROLOGIC:  Alert and oriented x 3   ASSESSMENT:    1. Coronary artery disease involving native coronary artery of native heart without angina pectoris   2. Chronic systolic heart failure (HCC)   3. Persistent atrial fibrillation (HCC)    PLAN:     1.,.  Coronary artery disease-    . No angina.   2.  Atrial fibrillation:   He remains in atrial fibrillation. He stopped his Eliquis about a year ago because he could not afford it. He now has Medicare and a drug plan that makes it affordable. He had what I think is a TIA several days after getting his third Covid booster shot. He had severe leg weakness and could not talk. He had drooling. The symptoms resolved after about 30 minutes. He does not have any residual effects from that.  We will be restarting Eliquis 5 mg twice a day. His heart rate is fairly well controlled.   3.  Chronic systolic congestive heart failure:   Has ant.apical akinesis  EF 40-45%   4.  Hyperlipidemia:    Check lipids, liver , bmp today   5. History of CVA with recent possible TIA. He had TIA symptoms while he was off Eliquis. We will get him back on Eliquis today.  See him again in 6 months. Medication Adjustments/Labs and Tests Ordered: Current medicines are reviewed at length with the patient today.  Concerns regarding medicines are outlined above.  Orders Placed This Encounter  Procedures  . Basic Metabolic Panel (BMET)  . Hepatic function panel  . Lipid Profile  . EKG 12-Lead   Meds ordered this encounter  Medications  . apixaban (ELIQUIS) 5 MG TABS tablet    Sig: Take 1 tablet (5 mg total) by mouth 2  (two) times daily.    Dispense:  60 tablet    Refill:  11       Signed, Gregory Miss, MD  06/21/2020 6:12 PM    [facility]

## 2020-06-21 ENCOUNTER — Other Ambulatory Visit: Payer: Self-pay

## 2020-06-21 ENCOUNTER — Encounter: Payer: Self-pay | Admitting: Cardiovascular Disease

## 2020-06-21 ENCOUNTER — Ambulatory Visit: Payer: Medicare HMO | Admitting: Cardiovascular Disease

## 2020-06-21 VITALS — BP 140/80 | HR 70 | Ht 68.0 in | Wt 172.6 lb

## 2020-06-21 DIAGNOSIS — I2510 Atherosclerotic heart disease of native coronary artery without angina pectoris: Secondary | ICD-10-CM

## 2020-06-21 DIAGNOSIS — I482 Chronic atrial fibrillation, unspecified: Secondary | ICD-10-CM | POA: Insufficient documentation

## 2020-06-21 DIAGNOSIS — I2109 ST elevation (STEMI) myocardial infarction involving other coronary artery of anterior wall: Secondary | ICD-10-CM

## 2020-06-21 DIAGNOSIS — I63411 Cerebral infarction due to embolism of right middle cerebral artery: Secondary | ICD-10-CM

## 2020-06-21 DIAGNOSIS — I5022 Chronic systolic (congestive) heart failure: Secondary | ICD-10-CM

## 2020-06-21 DIAGNOSIS — I4819 Other persistent atrial fibrillation: Secondary | ICD-10-CM

## 2020-06-21 DIAGNOSIS — I4811 Longstanding persistent atrial fibrillation: Secondary | ICD-10-CM

## 2020-06-21 DIAGNOSIS — I4891 Unspecified atrial fibrillation: Secondary | ICD-10-CM | POA: Insufficient documentation

## 2020-06-21 MED ORDER — APIXABAN 5 MG PO TABS: 5.0000 mg | ORAL_TABLET | Freq: Two times a day (BID) | ORAL | 11 refills | Status: DC

## 2020-06-21 NOTE — Patient Instructions (Addendum)
Medication Instructions:  Your physician has recommended you make the following change in your medication:  START Eliquis (Apixaban) 5 mg twice daily  STOP Aspirin  *If you need a refill on your cardiac medications before your next appointment, please call your pharmacy*   Lab Work: TODAY - basic metabolic panel, cholesterol, liver panel If you have labs (blood work) drawn today and your tests are completely normal, you will receive your results only by: Marland Kitchen MyChart Message (if you have MyChart) OR . A paper copy in the mail If you have any lab test that is abnormal or we need to change your treatment, we will call you to review the results.   Testing/Procedures: None Ordered   Follow-Up: At [facility], you and your health needs are our priority.  As part of our continuing mission to provide you with exceptional heart care, we have created designated Provider Care Teams.  These Care Teams include your primary Cardiologist (physician) and Advanced Practice Providers (APPs -  Physician Assistants and Nurse Practitioners) who all work together to provide you with the care you need, when you need it.  We recommend signing up for the patient portal called "MyChart".  Sign up information is provided on this After Visit Summary.  MyChart is used to connect with patients for Virtual Visits (Telemedicine).  Patients are able to view lab/test results, encounter notes, upcoming appointments, etc.  Non-urgent messages can be sent to your provider as well.   To learn more about what you can do with MyChart, go to [email or web address].    Your next appointment:   6 month(s)  The format for your next appointment:   In Person  Provider:   You may see Kristeen Miss, MD or one of the following Advanced Practice Providers on your designated Care Team:    Tereso Newcomer, PA-C  [place], [place]

## 2020-06-22 LAB — BASIC METABOLIC PANEL
BUN/Creatinine Ratio: 11 (ref 10–24)
BUN: 13 mg/dL (ref 8–27)
CO2: 25 mmol/L (ref 20–29)
Calcium: 9.3 mg/dL (ref 8.6–10.2)
Chloride: 102 mmol/L (ref 96–106)
Creatinine, Ser: 1.2 mg/dL (ref 0.76–1.27)
GFR calc Af Amer: 72 mL/min/{1.73_m2} (ref >59)
GFR calc non Af Amer: 63 mL/min/{1.73_m2} (ref >59)
Glucose: 97 mg/dL (ref 65–99)
Potassium: 4.9 mmol/L (ref 3.5–5.2)
Sodium: 139 mmol/L (ref 134–144)

## 2020-06-22 LAB — LIPID PANEL
Chol/HDL Ratio: 3.6 ratio (ref 0.0–5.0)
Cholesterol, Total: 208 mg/dL — ABNORMAL HIGH (ref 100–199)
HDL: 57 mg/dL (ref >39)
LDL Chol Calc (NIH): 134 mg/dL — ABNORMAL HIGH (ref 0–99)
Triglycerides: 94 mg/dL (ref 0–149)
VLDL Cholesterol Cal: 17 mg/dL (ref 5–40)

## 2020-06-22 LAB — HEPATIC FUNCTION PANEL
ALT: 11 IU/L (ref 0–44)
AST: 17 IU/L (ref 0–40)
Albumin: 4.5 g/dL (ref 3.8–4.8)
Alkaline Phosphatase: 74 IU/L (ref 44–121)
Bilirubin Total: 0.8 mg/dL (ref 0.0–1.2)
Bilirubin, Direct: 0.18 mg/dL (ref 0.00–0.40)
Total Protein: 6.6 g/dL (ref 6.0–8.5)

## 2020-06-24 ENCOUNTER — Other Ambulatory Visit: Payer: Self-pay | Admitting: *Deleted

## 2020-06-24 MED ORDER — ROSUVASTATIN CALCIUM 10 MG PO TABS: 10.0000 mg | ORAL_TABLET | Freq: Every day | ORAL | 3 refills | Status: DC

## 2022-01-06 LAB — COLOGUARD: COLOGUARD: NEGATIVE

## 2023-05-06 ENCOUNTER — Encounter (HOSPITAL_COMMUNITY): Payer: Self-pay | Admitting: Emergency Medicine

## 2023-05-06 ENCOUNTER — Emergency Department (HOSPITAL_COMMUNITY)
Admission: EM | Admit: 2023-05-06 | Discharge: 2023-05-06 | Disposition: A | Discharge status: Home / Self Care | Payer: Medicare Other | Attending: Emergency Medicine | Admitting: Emergency Medicine

## 2023-05-06 ENCOUNTER — Other Ambulatory Visit: Payer: Self-pay

## 2023-05-06 DIAGNOSIS — R110 Nausea: Secondary | ICD-10-CM | POA: Insufficient documentation

## 2023-05-06 DIAGNOSIS — R1030 Lower abdominal pain, unspecified: Secondary | ICD-10-CM | POA: Diagnosis not present

## 2023-05-06 DIAGNOSIS — R109 Unspecified abdominal pain: Secondary | ICD-10-CM | POA: Diagnosis present

## 2023-05-06 DIAGNOSIS — Z7982 Long term (current) use of aspirin: Secondary | ICD-10-CM | POA: Insufficient documentation

## 2023-05-06 LAB — COMPREHENSIVE METABOLIC PANEL
ALT: 14 U/L (ref 0–44)
AST: 17 U/L (ref 15–41)
Albumin: 3.7 g/dL (ref 3.5–5.0)
Alkaline Phosphatase: 53 U/L (ref 38–126)
Anion gap: 10 (ref 5–15)
BUN: 14 mg/dL (ref 8–23)
CO2: 25 mmol/L (ref 22–32)
Calcium: 8.7 mg/dL — ABNORMAL LOW (ref 8.9–10.3)
Chloride: 99 mmol/L (ref 98–111)
Creatinine, Ser: 1.11 mg/dL (ref 0.61–1.24)
GFR, Estimated: >60 mL/min (ref >60)
Glucose, Bld: 115 mg/dL — ABNORMAL HIGH (ref 70–99)
Potassium: 4.2 mmol/L (ref 3.5–5.1)
Sodium: 134 mmol/L — ABNORMAL LOW (ref 135–145)
Total Bilirubin: 1.3 mg/dL — ABNORMAL HIGH (ref 0.3–1.2)
Total Protein: 6.2 g/dL — ABNORMAL LOW (ref 6.5–8.1)

## 2023-05-06 LAB — CBC WITH DIFFERENTIAL/PLATELET
Abs Immature Granulocytes: 0.04 10*3/uL (ref 0.00–0.07)
Basophils Absolute: 0 10*3/uL (ref 0.0–0.1)
Basophils Relative: 0 %
Eosinophils Absolute: 0.1 10*3/uL (ref 0.0–0.5)
Eosinophils Relative: 1 %
HCT: 45.3 % (ref 39.0–52.0)
Hemoglobin: 15.7 g/dL (ref 13.0–17.0)
Immature Granulocytes: 1 %
Lymphocytes Relative: 11 %
Lymphs Abs: 0.8 10*3/uL (ref 0.7–4.0)
MCH: 31.5 pg (ref 26.0–34.0)
MCHC: 34.7 g/dL (ref 30.0–36.0)
MCV: 91 fL (ref 80.0–100.0)
Monocytes Absolute: 0.5 10*3/uL (ref 0.1–1.0)
Monocytes Relative: 6 %
Neutro Abs: 5.8 10*3/uL (ref 1.7–7.7)
Neutrophils Relative %: 81 %
Platelets: 166 10*3/uL (ref 150–400)
RBC: 4.98 MIL/uL (ref 4.22–5.81)
RDW: 12.4 % (ref 11.5–15.5)
WBC: 7.2 10*3/uL (ref 4.0–10.5)
nRBC: 0 % (ref 0.0–0.2)

## 2023-05-06 LAB — TROPONIN I (HIGH SENSITIVITY): Troponin I (High Sensitivity): 10 ng/L (ref <18)

## 2023-05-06 LAB — LIPASE, BLOOD: Lipase: 29 U/L (ref 11–51)

## 2023-05-06 MED ORDER — ONDANSETRON HCL 4 MG/2ML IJ SOLN
4.0000 mg | Freq: Once | INTRAMUSCULAR | Status: AC
  Administered 2023-05-06: 4 mg via INTRAVENOUS
  Filled 2023-05-06: qty 2

## 2023-05-06 MED ORDER — ONDANSETRON 4 MG PO TBDP: 4.0000 mg | ORAL_TABLET | Freq: Three times a day (TID) | ORAL | 0 refills | Status: DC | PRN

## 2023-05-06 NOTE — ED Triage Notes (Signed)
Patient arrives ambulatory by POV concerned for heart issues. Patient states when first waking up feeling fine. Went into the bathroom began having abdominal pain along with nausea. Patient reports normal BM this morning without any straining. Reports nausea worse when walking around. Denies any chest pain or shortness of breath. States he usually takes his dog on a mile walk every morning but could only make it about 100 yards this morning.

## 2023-05-06 NOTE — ED Notes (Signed)
ED Provider at bedside. 

## 2023-05-06 NOTE — ED Provider Notes (Signed)
[facility] [facility] AT [facility] Provider Note   CSN: [id number] Arrival date & time: 05/06/23  0820     History  Chief Complaint  Patient presents with   Abdominal Pain    Gregory Shields is a 72 y.o. male.   Abdominal Pain Patient presents with abdominal pain and nausea.  Was fine this morning.  Then went to the bathroom and began to have pain.  States felt nausea.  No trouble having a bowel movement.  States attempted to walk this morning and developed more pain in the abdomen and more nausea.  Sitting still he states the pain is fine. Of note history of atrial fibrillation.  Is in A-fib.  Only on aspirin however.  States he does not want to take any medicine every day for his whole life.   Past Medical History:  Diagnosis Date   Depression    Sleep apnea   Atrial fibrillation Myocardial infarction  Home Medications Prior to Admission medications   Medication Sig Start Date End Date Taking? Authorizing Provider  aspirin EC 81 MG tablet Take 1 tablet (81 mg total) by mouth daily. 09/30/17  Yes Nahser, Deloris Ping, MD  nitroGLYCERIN (NITROSTAT) 0.4 MG SL tablet Place 1 tablet (0.4 mg total) under the tongue every 5 (five) minutes as needed for chest pain. 09/30/17 05/06/23 Yes Nahser, Deloris Ping, MD  ondansetron (ZOFRAN-ODT) 4 MG disintegrating tablet Take 1 tablet (4 mg total) by mouth every 8 (eight) hours as needed for nausea or vomiting. 05/06/23  Yes Benjiman Core, MD  apixaban (ELIQUIS) 5 MG TABS tablet Take 1 tablet (5 mg total) by mouth 2 (two) times daily. Patient not taking: Reported on 05/06/2023 06/21/20   Nahser, Deloris Ping, MD  rosuvastatin (CRESTOR) 10 MG tablet Take 1 tablet (10 mg total) by mouth daily. Patient not taking: Reported on 05/06/2023 06/24/20   Nahser, Deloris Ping, MD      Allergies    Penicillins    Review of Systems   Review of Systems  Gastrointestinal:  Positive for abdominal pain.    Physical Exam Updated Vital Signs BP  (!) 160/99   Pulse 82   Temp (!) 97.5 F (36.4 C) (Oral)   Resp 16   Ht 5\' 8"  (1.727 m)   Wt 72.6 kg   SpO2 100%   BMI 24.33 kg/m  Physical Exam Vitals and nursing note reviewed.  Pulmonary:     Effort: Pulmonary effort is normal.  Abdominal:     Tenderness: There is no abdominal tenderness.     Hernia: No hernia is present. There is no hernia in the umbilical area.  Skin:    General: Skin is warm.     Capillary Refill: Capillary refill takes less than 2 seconds.  Neurological:     Mental Status: He is alert and oriented to person, place, and time.     ED Results / Procedures / Treatments   Labs (all labs ordered are listed, but only abnormal results are displayed) Labs Reviewed  COMPREHENSIVE METABOLIC PANEL - Abnormal; Notable for the following components:      Result Value   Sodium 134 (*)    Glucose, Bld 115 (*)    Calcium 8.7 (*)    Total Protein 6.2 (*)    Total Bilirubin 1.3 (*)    All other components within normal limits  LIPASE, BLOOD  CBC WITH DIFFERENTIAL/PLATELET  TROPONIN I (HIGH SENSITIVITY)  TROPONIN I (HIGH SENSITIVITY)    EKG EKG  Interpretation Date/Time:  Thursday May 06 2023 08:30:24 EDT Ventricular Rate:  80 PR Interval:    QRS Duration:  87 QT Interval:  399 QTC Calculation: 461 R Axis:   54  Text Interpretation: Atrial fibrillation Ventricular premature complex Anterior infarct, old Confirmed by Benjiman Core [phone]) on 05/06/2023 9:09:19 AM  Radiology No results found.  Procedures Procedures    Medications Ordered in ED Medications  ondansetron (ZOFRAN) injection 4 mg (4 mg Intravenous Given 05/06/23 0900)    ED Course/ Medical Decision Making/ A&P                                 Medical Decision Making Amount and/or Complexity of Data Reviewed Labs: ordered.  Risk Prescription drug management.   Patient with nausea and abdominal pain.  Differential diagnoses long but includes gastroenteritis, diverticulitis,  potential even mesenteric ischemia.  Benign abdominal exam.  However patient states it did get worse with ambulation.  Will start with basic blood work.  Reviewed previous cardiology note.  Blood work reassuring.  Patient feels much better.  Nausea improved.  Eager to go home.  I think patient is reasonable for discharge.  Will give some Zofran.  Causes such as mesenteric ischemia felt less likely.  Can return if needed.  Will discharge        Final Clinical Impression(s) / ED Diagnoses Final diagnoses:  Lower abdominal pain    Rx / DC Orders ED Discharge Orders          Ordered    ondansetron (ZOFRAN-ODT) 4 MG disintegrating tablet  Every 8 hours PRN        05/06/23 1028              Benjiman Core, MD 05/06/23 1032

## 2023-11-18 ENCOUNTER — Encounter (HOSPITAL_COMMUNITY): Payer: Self-pay | Admitting: Emergency Medicine

## 2023-11-18 ENCOUNTER — Emergency Department (HOSPITAL_BASED_OUTPATIENT_CLINIC_OR_DEPARTMENT_OTHER)

## 2023-11-18 ENCOUNTER — Emergency Department (HOSPITAL_COMMUNITY)
Admission: EM | Admit: 2023-11-18 | Discharge: 2023-11-18 | Discharge status: Against Medical Advice | Attending: Emergency Medicine | Admitting: Emergency Medicine

## 2023-11-18 ENCOUNTER — Other Ambulatory Visit: Payer: Self-pay

## 2023-11-18 ENCOUNTER — Inpatient Hospital Stay (HOSPITAL_BASED_OUTPATIENT_CLINIC_OR_DEPARTMENT_OTHER)
Admission: EM | Admit: 2023-11-18 | Discharge: 2023-11-20 | DRG: 372 | Disposition: A | Discharge status: Home / Self Care | Attending: Internal Medicine | Admitting: Internal Medicine

## 2023-11-18 ENCOUNTER — Encounter (HOSPITAL_BASED_OUTPATIENT_CLINIC_OR_DEPARTMENT_OTHER): Payer: Self-pay | Admitting: Emergency Medicine

## 2023-11-18 DIAGNOSIS — K3589 Other acute appendicitis without perforation or gangrene: Secondary | ICD-10-CM | POA: Diagnosis not present

## 2023-11-18 DIAGNOSIS — R110 Nausea: Secondary | ICD-10-CM | POA: Insufficient documentation

## 2023-11-18 DIAGNOSIS — Z7982 Long term (current) use of aspirin: Secondary | ICD-10-CM | POA: Diagnosis not present

## 2023-11-18 DIAGNOSIS — Z880 Allergy status to penicillin: Secondary | ICD-10-CM | POA: Diagnosis not present

## 2023-11-18 DIAGNOSIS — I5020 Unspecified systolic (congestive) heart failure: Secondary | ICD-10-CM | POA: Diagnosis not present

## 2023-11-18 DIAGNOSIS — I2510 Atherosclerotic heart disease of native coronary artery without angina pectoris: Secondary | ICD-10-CM | POA: Diagnosis present

## 2023-11-18 DIAGNOSIS — E785 Hyperlipidemia, unspecified: Secondary | ICD-10-CM | POA: Diagnosis present

## 2023-11-18 DIAGNOSIS — Z8249 Family history of ischemic heart disease and other diseases of the circulatory system: Secondary | ICD-10-CM | POA: Diagnosis not present

## 2023-11-18 DIAGNOSIS — I252 Old myocardial infarction: Secondary | ICD-10-CM | POA: Diagnosis not present

## 2023-11-18 DIAGNOSIS — I110 Hypertensive heart disease with heart failure: Secondary | ICD-10-CM | POA: Diagnosis present

## 2023-11-18 DIAGNOSIS — I700 Atherosclerosis of aorta: Secondary | ICD-10-CM | POA: Diagnosis present

## 2023-11-18 DIAGNOSIS — G4730 Sleep apnea, unspecified: Secondary | ICD-10-CM | POA: Diagnosis present

## 2023-11-18 DIAGNOSIS — F32A Depression, unspecified: Secondary | ICD-10-CM | POA: Diagnosis present

## 2023-11-18 DIAGNOSIS — K3533 Acute appendicitis with perforation and localized peritonitis, with abscess: Secondary | ICD-10-CM | POA: Diagnosis present

## 2023-11-18 DIAGNOSIS — Z5321 Procedure and treatment not carried out due to patient leaving prior to being seen by health care provider: Secondary | ICD-10-CM | POA: Insufficient documentation

## 2023-11-18 DIAGNOSIS — Z01810 Encounter for preprocedural cardiovascular examination: Secondary | ICD-10-CM

## 2023-11-18 DIAGNOSIS — I4892 Unspecified atrial flutter: Secondary | ICD-10-CM | POA: Diagnosis present

## 2023-11-18 DIAGNOSIS — K3532 Acute appendicitis with perforation and localized peritonitis, without abscess: Secondary | ICD-10-CM | POA: Diagnosis not present

## 2023-11-18 DIAGNOSIS — Z79899 Other long term (current) drug therapy: Secondary | ICD-10-CM

## 2023-11-18 DIAGNOSIS — R1011 Right upper quadrant pain: Secondary | ICD-10-CM | POA: Diagnosis not present

## 2023-11-18 DIAGNOSIS — Z9109 Other allergy status, other than to drugs and biological substances: Secondary | ICD-10-CM | POA: Diagnosis not present

## 2023-11-18 DIAGNOSIS — E871 Hypo-osmolality and hyponatremia: Secondary | ICD-10-CM | POA: Diagnosis present

## 2023-11-18 DIAGNOSIS — I25810 Atherosclerosis of coronary artery bypass graft(s) without angina pectoris: Secondary | ICD-10-CM

## 2023-11-18 DIAGNOSIS — R1031 Right lower quadrant pain: Secondary | ICD-10-CM | POA: Insufficient documentation

## 2023-11-18 DIAGNOSIS — K3580 Unspecified acute appendicitis: Secondary | ICD-10-CM | POA: Diagnosis not present

## 2023-11-18 DIAGNOSIS — Z91128 Patient's intentional underdosing of medication regimen for other reason: Secondary | ICD-10-CM | POA: Diagnosis not present

## 2023-11-18 DIAGNOSIS — R109 Unspecified abdominal pain: Secondary | ICD-10-CM | POA: Diagnosis present

## 2023-11-18 DIAGNOSIS — I10 Essential (primary) hypertension: Secondary | ICD-10-CM | POA: Diagnosis not present

## 2023-11-18 DIAGNOSIS — I5022 Chronic systolic (congestive) heart failure: Secondary | ICD-10-CM | POA: Diagnosis present

## 2023-11-18 DIAGNOSIS — I482 Chronic atrial fibrillation, unspecified: Secondary | ICD-10-CM | POA: Diagnosis present

## 2023-11-18 DIAGNOSIS — T45516A Underdosing of anticoagulants, initial encounter: Secondary | ICD-10-CM | POA: Diagnosis present

## 2023-11-18 DIAGNOSIS — K4090 Unilateral inguinal hernia, without obstruction or gangrene, not specified as recurrent: Secondary | ICD-10-CM | POA: Diagnosis present

## 2023-11-18 DIAGNOSIS — Z7901 Long term (current) use of anticoagulants: Secondary | ICD-10-CM | POA: Diagnosis not present

## 2023-11-18 DIAGNOSIS — Z955 Presence of coronary angioplasty implant and graft: Secondary | ICD-10-CM

## 2023-11-18 DIAGNOSIS — I4811 Longstanding persistent atrial fibrillation: Secondary | ICD-10-CM | POA: Diagnosis present

## 2023-11-18 DIAGNOSIS — Z8673 Personal history of transient ischemic attack (TIA), and cerebral infarction without residual deficits: Secondary | ICD-10-CM

## 2023-11-18 HISTORY — DX: Cerebral infarction, unspecified: I63.9

## 2023-11-18 HISTORY — DX: Acute myocardial infarction, unspecified: I21.9

## 2023-11-18 HISTORY — DX: Atherosclerotic heart disease of native coronary artery without angina pectoris: I25.10

## 2023-11-18 HISTORY — DX: Essential (primary) hypertension: I10

## 2023-11-18 LAB — CBC WITH DIFFERENTIAL/PLATELET
Abs Immature Granulocytes: 0.05 10*3/uL (ref 0.00–0.07)
Basophils Absolute: 0 10*3/uL (ref 0.0–0.1)
Basophils Relative: 0 %
Eosinophils Absolute: 0.1 10*3/uL (ref 0.0–0.5)
Eosinophils Relative: 1 %
HCT: 42.2 % (ref 39.0–52.0)
Hemoglobin: 14.9 g/dL (ref 13.0–17.0)
Immature Granulocytes: 1 %
Lymphocytes Relative: 10 %
Lymphs Abs: 1.1 10*3/uL (ref 0.7–4.0)
MCH: 32.2 pg (ref 26.0–34.0)
MCHC: 35.3 g/dL (ref 30.0–36.0)
MCV: 91.1 fL (ref 80.0–100.0)
Monocytes Absolute: 0.9 10*3/uL (ref 0.1–1.0)
Monocytes Relative: 8 %
Neutro Abs: 9 10*3/uL — ABNORMAL HIGH (ref 1.7–7.7)
Neutrophils Relative %: 80 %
Platelets: 235 10*3/uL (ref 150–400)
RBC: 4.63 MIL/uL (ref 4.22–5.81)
RDW: 11.9 % (ref 11.5–15.5)
WBC: 11.1 10*3/uL — ABNORMAL HIGH (ref 4.0–10.5)
nRBC: 0 % (ref 0.0–0.2)

## 2023-11-18 LAB — SURGICAL PCR SCREEN
MRSA, PCR: NEGATIVE
Staphylococcus aureus: NEGATIVE

## 2023-11-18 LAB — COMPREHENSIVE METABOLIC PANEL WITH GFR
ALT: 13 U/L (ref 0–44)
AST: 17 U/L (ref 15–41)
Albumin: 3.3 g/dL — ABNORMAL LOW (ref 3.5–5.0)
Alkaline Phosphatase: 57 U/L (ref 38–126)
Anion gap: 11 (ref 5–15)
BUN: 11 mg/dL (ref 8–23)
CO2: 23 mmol/L (ref 22–32)
Calcium: 8.8 mg/dL — ABNORMAL LOW (ref 8.9–10.3)
Chloride: 99 mmol/L (ref 98–111)
Creatinine, Ser: 0.97 mg/dL (ref 0.61–1.24)
GFR, Estimated: >60 mL/min (ref >60)
Glucose, Bld: 114 mg/dL — ABNORMAL HIGH (ref 70–99)
Potassium: 3.9 mmol/L (ref 3.5–5.1)
Sodium: 133 mmol/L — ABNORMAL LOW (ref 135–145)
Total Bilirubin: 2.3 mg/dL — ABNORMAL HIGH (ref 0.0–1.2)
Total Protein: 6.4 g/dL — ABNORMAL LOW (ref 6.5–8.1)

## 2023-11-18 LAB — LIPASE, BLOOD: Lipase: 27 U/L (ref 11–51)

## 2023-11-18 LAB — APTT: aPTT: 34 s (ref 24–36)

## 2023-11-18 MED ORDER — DIPHENHYDRAMINE HCL 50 MG/ML IJ SOLN
25.0000 mg | Freq: Once | INTRAMUSCULAR | Status: AC
  Administered 2023-11-18: 25 mg via INTRAVENOUS
  Filled 2023-11-18: qty 1

## 2023-11-18 MED ORDER — FENTANYL CITRATE PF 50 MCG/ML IJ SOSY
50.0000 ug | PREFILLED_SYRINGE | Freq: Once | INTRAMUSCULAR | Status: AC
  Administered 2023-11-18: 50 ug via INTRAVENOUS
  Filled 2023-11-18: qty 1

## 2023-11-18 MED ORDER — FENTANYL CITRATE PF 50 MCG/ML IJ SOSY
50.0000 ug | PREFILLED_SYRINGE | INTRAMUSCULAR | Status: DC | PRN
  Administered 2023-11-18: 50 ug via INTRAVENOUS
  Filled 2023-11-18: qty 1

## 2023-11-18 MED ORDER — IOHEXOL 300 MG/ML  SOLN
100.0000 mL | Freq: Once | INTRAMUSCULAR | Status: AC | PRN
  Administered 2023-11-18: 100 mL via INTRAVENOUS

## 2023-11-18 MED ORDER — SODIUM CHLORIDE 0.9 % IV SOLN
2.0000 g | Freq: Two times a day (BID) | INTRAVENOUS | Status: DC
  Administered 2023-11-18 – 2023-11-20 (×5): 2 g via INTRAVENOUS
  Filled 2023-11-18 (×5): qty 12.5

## 2023-11-18 MED ORDER — LACTATED RINGERS IV BOLUS
1000.0000 mL | Freq: Once | INTRAVENOUS | Status: AC
  Administered 2023-11-18: 1000 mL via INTRAVENOUS

## 2023-11-18 MED ORDER — ORAL CARE MOUTH RINSE: 15.0000 mL | OROMUCOSAL | Status: DC | PRN

## 2023-11-18 MED ORDER — METRONIDAZOLE 500 MG/100ML IV SOLN
500.0000 mg | Freq: Three times a day (TID) | INTRAVENOUS | Status: DC
  Administered 2023-11-18 – 2023-11-19 (×3): 500 mg via INTRAVENOUS
  Filled 2023-11-18 (×3): qty 100

## 2023-11-18 NOTE — Progress Notes (Signed)
  Call [facility] at [phone] re: Pt Gregory Shields, Gregory Shields MRN [id number] at [facility].  Provider: Mylinda Asa.   Chart reviewed and pt has history of CAD and anterior MI requiring left heart catheterization, paroxysmal atrial flutter and atrial fibrillation on Eliquis h/o CVA, CHF, presents with concern for right lower quadrant abdominal pain CT abd showing Acute Appendicitis with small micro perforation and admission requested.  Vitals:   11/18/23 1047  BP: 134/89  Pulse: 100  Temp: 97.9 F (36.6 C)  Resp: 16  SpO2: 99%  TempSrc: Oral       Latest Ref Rng & Units 11/18/2023    5:26 AM 05/06/2023    8:54 AM 06/21/2020    4:50 PM  CMP  Glucose 70 - 99 mg/dL 696  295  97   BUN 8 - 23 mg/dL 11  14  13    Creatinine 0.61 - 1.24 mg/dL 2.84  1.32  4.40   Sodium 135 - 145 mmol/L 133  134  139   Potassium 3.5 - 5.1 mmol/L 3.9  4.2  4.9   Chloride 98 - 111 mmol/L 99  99  102   CO2 22 - 32 mmol/L 23  25  25    Calcium 8.9 - 10.3 mg/dL 8.8  8.7  9.3   Total Protein 6.5 - 8.1 g/dL 6.4  6.2  6.6   Total Bilirubin 0.0 - 1.2 mg/dL 2.3  1.3  0.8   Alkaline Phos 38 - 126 U/L 57  53  74   AST 15 - 41 U/L 17  17  17    ALT 0 - 44 U/L 13  14  11        Latest Ref Rng & Units 11/18/2023    5:26 AM 05/06/2023    8:54 AM 05/15/2014    2:44 AM  CBC  WBC 4.0 - 10.5 K/uL 11.1  7.2  7.3   Hemoglobin 13.0 - 17.0 g/dL 10.2  72.5  36.6   Hematocrit 39.0 - 52.0 % 42.2  45.3  39.4   Platelets 150 - 400 K/uL 235  166  244     ceFEPime (MAXIPIME) IV 2 g (11/18/23 1206)   metronidazole 500 mg (11/18/23 1238)   Scheduled Meds: Continuous Infusions:  ceFEPime (MAXIPIME) IV 2 g (11/18/23 1206)   metronidazole 500 mg (11/18/23 1238)   Case has d/w general surgery :  Thomos Flies.  Request cardiology consult: Consulted.   Pt accepted to med tele bed.

## 2023-11-18 NOTE — ED Notes (Signed)
 Called [facility] for transport

## 2023-11-18 NOTE — ED Notes (Signed)
 [facility] at bedside

## 2023-11-18 NOTE — ED Triage Notes (Signed)
  Patient comes in with RLQ pain that has been going on for 24 hours.  Patient endorses nausea with no vomiting.  No OTC meds for pain.  Pain 8/10, sharp.  Denies any dysuria. Last BM was yesterday.

## 2023-11-18 NOTE — Consult Note (Signed)
 Gregory Shields 06-30-1951  [id number].     HPI:  Gregory Shields is a 73 yo male who presented to the ED with acute RLQ abdominal pain. His pain started two days ago and has progressively worsened. He has not had any fevers or chills. WBC is mildly elevated at 11. A CT scan showed appendicitis with a likely contained microperforation.  He has a history of CAD with previous MI, LV thrombus with CVA in 2015, a-fib and chronic systolic heart failure. He takes a baby aspirin but no other blood thinners. He was previously on Eliquis but has not been taking it. He has been seen by cardiology and stratified as moderate to high risk for a perioperative cardiac event due to his history of CAD, but no further additional cardiac workup is indicated.  ROS: ROS  Family History  Problem Relation Age of Onset   Heart attack Father     Past Medical History:  Diagnosis Date   Coronary artery disease    Depression    Hypertension    Myocardial infarction [facility])    Sleep apnea    Stroke [facility])     Past Surgical History:  Procedure Laterality Date   LEFT HEART CATHETERIZATION WITH CORONARY ANGIOGRAM N/A 05/12/2014   Procedure: LEFT HEART CATHETERIZATION WITH CORONARY ANGIOGRAM;  Surgeon: Robynn Pane, MD;  Location: [facility] CATH LAB;  Service: Cardiovascular;  Laterality: N/A;    Social History:  reports that he has never smoked. He has never used smokeless tobacco. He reports current alcohol use. He reports that he does not use drugs.  Allergies:  Allergies  Allergen Reactions   Other Itching and Other (See Comments)    Pollen- Itchy eyes, sneezing, runny nose, etc..   Penicillins Other (See Comments)    Doesn't remember exact allergic reaction    Medications Prior to Admission  Medication Sig Dispense Refill   aspirin EC 325 MG tablet Take 162.5 mg by mouth daily as needed (for headaches).     B Complex Vitamins (VITAMIN B COMPLEX) TABS Take 1 tablet by mouth daily with breakfast.      Cholecalciferol (VITAMIN D3 PO) Take 1 capsule by mouth daily.     Multiple Vitamins-Minerals (MULTIVITAMIN MEN) TABS Take 1 tablet by mouth daily with breakfast.     Omega-3 Fatty Acids (FISH OIL PO) Take 1 capsule by mouth daily.     apixaban (ELIQUIS) 5 MG TABS tablet Take 1 tablet (5 mg total) by mouth 2 (two) times daily. (Patient not taking: Reported on 11/18/2023) 60 tablet 11   aspirin EC 81 MG tablet Take 1 tablet (81 mg total) by mouth daily. (Patient not taking: Reported on 11/18/2023)     nitroGLYCERIN (NITROSTAT) 0.4 MG SL tablet Place 1 tablet (0.4 mg total) under the tongue every 5 (five) minutes as needed for chest pain. (Patient not taking: Reported on 11/18/2023) 25 tablet 6   ondansetron (ZOFRAN-ODT) 4 MG disintegrating tablet Take 1 tablet (4 mg total) by mouth every 8 (eight) hours as needed for nausea or vomiting. (Patient not taking: Reported on 11/18/2023) 8 tablet 0   rosuvastatin (CRESTOR) 10 MG tablet Take 1 tablet (10 mg total) by mouth daily. (Patient not taking: Reported on 11/18/2023) 90 tablet 3     Physical Exam: Blood pressure 125/70, pulse 93, temperature 98.5 F (36.9 C), temperature source Oral, resp. rate 18, height 5' 7.99" (1.727 m), weight 72 kg, SpO2 100%. General: resting comfortably, appears stated age, no apparent distress Neurological: alert  and oriented HEENT: normocephalic, atraumatic Respiratory: normal work of breathing on room air Abdomen: soft, nondistended, focally tender to palpation in the RLQ.  Extremities: warm and well-perfused, no deformities, moving all extremities spontaneously Psychiatric: normal mood and affect Skin: warm and dry, no jaundice, no rashes or lesions   Results for orders placed or performed during the hospital encounter of 11/18/23 (from the past 48 hours)  APTT     Status: None   Collection Time: 11/18/23 11:57 AM  Result Value Ref Range   aPTT 34 24 - 36 seconds    Comment: Performed at [facility], [address], [place], [place] [address]   CT ABDOMEN PELVIS W CONTRAST Result Date: 11/18/2023 CLINICAL DATA:  RLQ abdominal pain. EXAM: CT ABDOMEN AND PELVIS WITH CONTRAST TECHNIQUE: Multidetector CT imaging of the abdomen and pelvis was performed using the standard protocol following bolus administration of intravenous contrast. RADIATION DOSE REDUCTION: This exam was performed according to the departmental dose-optimization program which includes automated exposure control, adjustment of the mA and/or kV according to patient size and/or use of iterative reconstruction technique. CONTRAST:  OMNIPAQUE IOHEXOL 300 MG/ML  SOLN COMPARISON:  None Available. FINDINGS: Lower chest: There are patchy atelectatic changes in the visualized lung bases. No overt consolidation. No pleural effusion. The heart is normal in size. No pericardial effusion. There are coronary artery atherosclerotic calcifications, in keeping with coronary artery disease. Hepatobiliary: The liver is normal in size. Non-cirrhotic configuration. No suspicious mass. These is mild diffuse hepatic steatosis. No intrahepatic or extrahepatic bile duct dilation. No calcified gallstones. Normal gallbladder wall thickness. No pericholecystic inflammatory changes. Pancreas: Unremarkable. No pancreatic ductal dilatation or surrounding inflammatory changes. Spleen: Within normal limits. No focal lesion. Adrenals/Urinary Tract: Adrenal glands are unremarkable. No suspicious renal mass. 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 No hydronephrosis. No renal or ureteric calculi. Unremarkable urinary bladder. Stomach/Bowel: There is a tiny sliding hiatal hernia. No disproportionate dilation of the small or large bowel loops. There is dilated appendix measuring up to 13 mm near the apex. There is small fluid collection surrounding the tip which exhibits partial wall formation (series 4, image 49).  There is a small focus of calcification in this region. Findings may represent small contained micro perforation/periappendiceal abscess. There is fat stranding surrounding the rest of the appendix as well. There is also mild circumferential thickening of the terminal ileum (series 2, image 52) as well as another distal small bowel loop (series 2, image 58) in the vicinity, which are likely secondarily involved. There is also secondary involvement of the cecum. There is fat stranding and trace fluid surrounding the appendix. Rest of the bowel loops are unremarkable. Vascular/Lymphatic: No ascites or pneumoperitoneum. No abdominal or pelvic lymphadenopathy, by size criteria. No aneurysmal dilation of the major abdominal arteries. There are moderate peripheral atherosclerotic vascular calcifications of the aorta and its major branches. Reproductive: Normal size prostate. Symmetric seminal vesicles. Other: There is a small to moderate left inguinal hernia containing fat and portion of unobstructed proximal sigmoid colon. The soft tissues and abdominal wall are otherwise unremarkable. Musculoskeletal: No suspicious osseous lesions. There are moderate multilevel degenerative changes in the visualized spine. IMPRESSION: 1. Findings compatible with acute appendicitis with small contained micro perforation/periappendiceal abscess. There is secondary involvement of the terminal ileum, distal small bowel loop and cecum. No bowel obstruction. 2. Multiple other nonacute observations, as described above. Aortic Atherosclerosis (ICD10-I70.0). Electronically Signed   By: Rhea Belton  Jackquelyn Mass M.D.   On: 11/18/2023 11:23      Assessment/Plan 74 yo male with a history of CAD and CVA, presenting with acute RLQ abdominal pain. I personally reviewed his labs, imaging and notes. CT scan shows appendicitis with an apparent contained perforation, no drainable abscess. He is focally tender on exam but does not have systemic signs of sepsis.  I discussed the risks and benefits of surgery vs antibiotics alone. Given his history of CAD and elevated risk of periop cardiac complications, if he improves with antibiotics, it may be best to avoid surgery in his case. I did however review the future risks of recurrent appendicitis, which is likely around 30%. He leans toward having surgery. If he does not improve or worsens on antibiotics, will need to proceed with appendectomy. Echo is pending per cardiology. Will revaluate patient tomorrow and make a final decision pending results of echo.  - Ok for clear liquids for now, NPO after midnight - Continue IV antibiotics - Will follow closely   Karleen Overall, MD [facility] General, Hepatobiliary and Pancreatic Surgery 11/18/23 10:44 PM

## 2023-11-18 NOTE — ED Provider Notes (Signed)
 [place] EMERGENCY DEPARTMENT AT [facility] Provider Note   CSN: [id number] Arrival date & time: 11/18/23  1035     History  No chief complaint on file.   Gregory Shields is a 73 y.o. male with history of CAD and anterior MI requiring left heart catheterization, paroxysmal atrial flutter and atrial fibrillation, CVA, CHF, presents with concern for right lower quadrant abdominal pain that has been ongoing for the past 2 days.  States this pain came on suddenly across his lower abdomen.  The pain then localized to the right lower quadrant.  He reports some nausea but no vomiting.  Denies any fever or chills, dysuria, hematuria, increased frequency.  Reports normal bowel movements.  Denies any previous abdominal surgeries.  Last oral intake was at 10:30 AM today when patient drank water. Reports he is taking 81mg  aspiring daily but is not taking any other medications including Eliquis .  HPI     Home Medications Prior to Admission medications   Medication Sig Start Date End Date Taking? Authorizing Provider  apixaban  (ELIQUIS ) 5 MG TABS tablet Take 1 tablet (5 mg total) by mouth 2 (two) times daily. Patient not taking: Reported on 05/06/2023 06/21/20   Nahser, Lela Purple, MD  aspirin  EC 81 MG tablet Take 1 tablet (81 mg total) by mouth daily. 09/30/17   Nahser, Lela Purple, MD  nitroGLYCERIN  (NITROSTAT ) 0.4 MG SL tablet Place 1 tablet (0.4 mg total) under the tongue every 5 (five) minutes as needed for chest pain. 09/30/17 05/06/23  Nahser, Lela Purple, MD  ondansetron  (ZOFRAN -ODT) 4 MG disintegrating tablet Take 1 tablet (4 mg total) by mouth every 8 (eight) hours as needed for nausea or vomiting. 05/06/23   Mozell Arias, MD  rosuvastatin  (CRESTOR ) 10 MG tablet Take 1 tablet (10 mg total) by mouth daily. Patient not taking: Reported on 05/06/2023 06/24/20   Nahser, Lela Purple, MD      Allergies    Penicillins    Review of Systems   Review of Systems  Constitutional:  Negative for  fever.  Gastrointestinal:  Positive for abdominal pain.    Physical Exam Updated Vital Signs BP 125/78   Pulse (!) 104   Temp 97.9 F (36.6 C) (Oral)   Resp 16   SpO2 100%  Physical Exam Vitals and nursing note reviewed.  Constitutional:      General: He is not in acute distress.    Appearance: He is well-developed.  HENT:     Head: Normocephalic and atraumatic.  Eyes:     Conjunctiva/sclera: Conjunctivae normal.  Cardiovascular:     Rate and Rhythm: Normal rate. Rhythm irregular.     Heart sounds: No murmur heard. Pulmonary:     Effort: Pulmonary effort is normal. No respiratory distress.     Breath sounds: Normal breath sounds.  Abdominal:     Palpations: Abdomen is soft.     Tenderness: There is abdominal tenderness.     Comments: Tender in the right lower quadrant without guarding. No hernias present  Musculoskeletal:        General: No swelling.     Cervical back: Neck supple.  Skin:    General: Skin is warm and dry.     Capillary Refill: Capillary refill takes less than 2 seconds.     Comments: No skin changes of the abdomen  Neurological:     Mental Status: He is alert.  Psychiatric:        Mood and Affect: Mood normal.  ED Results / Procedures / Treatments   Labs (all labs ordered are listed, but only abnormal results are displayed) Labs Reviewed  APTT    EKG None  Radiology CT ABDOMEN PELVIS W CONTRAST Result Date: 11/18/2023 CLINICAL DATA:  RLQ abdominal pain. EXAM: CT ABDOMEN AND PELVIS WITH CONTRAST TECHNIQUE: Multidetector CT imaging of the abdomen and pelvis was performed using the standard protocol following bolus administration of intravenous contrast. RADIATION DOSE REDUCTION: This exam was performed according to the departmental dose-optimization program which includes automated exposure control, adjustment of the mA and/or kV according to patient size and/or use of iterative reconstruction technique. CONTRAST:  OMNIPAQUE  IOHEXOL   300 MG/ML  SOLN COMPARISON:  None Available. FINDINGS: Lower chest: There are patchy atelectatic changes in the visualized lung bases. No overt consolidation. No pleural effusion. The heart is normal in size. No pericardial effusion. There are coronary artery atherosclerotic calcifications, in keeping with coronary artery disease. Hepatobiliary: The liver is normal in size. Non-cirrhotic configuration. No suspicious mass. These is mild diffuse hepatic steatosis. No intrahepatic or extrahepatic bile duct dilation. No calcified gallstones. Normal gallbladder wall thickness. No pericholecystic inflammatory changes. Pancreas: Unremarkable. No pancreatic ductal dilatation or surrounding inflammatory changes. Spleen: Within normal limits. No focal lesion. Adrenals/Urinary Tract: Adrenal glands are unremarkable. No suspicious renal mass. 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 No hydronephrosis. No renal or ureteric calculi. Unremarkable urinary bladder. Stomach/Bowel: There is a tiny sliding hiatal hernia. No disproportionate dilation of the small or large bowel loops. There is dilated appendix measuring up to 13 mm near the apex. There is small fluid collection surrounding the tip which exhibits partial wall formation (series 4, image 49). There is a small focus of calcification in this region. Findings may represent small contained micro perforation/periappendiceal abscess. There is fat stranding surrounding the rest of the appendix as well. There is also mild circumferential thickening of the terminal ileum (series 2, image 52) as well as another distal small bowel loop (series 2, image 58) in the vicinity, which are likely secondarily involved. There is also secondary involvement of the cecum. There is fat stranding and trace fluid surrounding the appendix. Rest of the bowel loops are unremarkable. Vascular/Lymphatic: No ascites or pneumoperitoneum. No abdominal or pelvic  lymphadenopathy, by size criteria. No aneurysmal dilation of the major abdominal arteries. There are moderate peripheral atherosclerotic vascular calcifications of the aorta and its major branches. Reproductive: Normal size prostate. Symmetric seminal vesicles. Other: There is a small to moderate left inguinal hernia containing fat and portion of unobstructed proximal sigmoid colon. The soft tissues and abdominal wall are otherwise unremarkable. Musculoskeletal: No suspicious osseous lesions. There are moderate multilevel degenerative changes in the visualized spine. IMPRESSION: 1. Findings compatible with acute appendicitis with small contained micro perforation/periappendiceal abscess. There is secondary involvement of the terminal ileum, distal small bowel loop and cecum. No bowel obstruction. 2. Multiple other nonacute observations, as described above. Aortic Atherosclerosis (ICD10-I70.0). Electronically Signed   By: Beula Brunswick M.D.   On: 11/18/2023 11:23    Procedures Procedures    Medications Ordered in ED Medications  metroNIDAZOLE  (FLAGYL ) IVPB 500 mg (500 mg Intravenous New Bag/Given 11/18/23 1238)  ceFEPIme  (MAXIPIME ) 2 g in sodium chloride  0.9 % 100 mL IVPB (2 g Intravenous New Bag/Given 11/18/23 1206)  fentaNYL  (SUBLIMAZE ) injection 50 mcg (has no administration in time range)  fentaNYL  (SUBLIMAZE ) injection 50 mcg (50 mcg Intravenous Given 11/18/23 1114)  iohexol  (OMNIPAQUE ) 300 MG/ML solution  100 mL (100 mLs Intravenous Contrast Given 11/18/23 1106)  diphenhydrAMINE  (BENADRYL ) injection 25 mg (25 mg Intravenous Given 11/18/23 1202)  lactated ringers  bolus 1,000 mL (1,000 mLs Intravenous New Bag/Given 11/18/23 1253)    ED Course/ Medical Decision Making/ A&P                                 Medical Decision Making Amount and/or Complexity of Data Reviewed Labs: ordered. Radiology: ordered.  Risk Prescription drug management.     Differential diagnosis includes but is not  limited to Cholelithiasis, cholangitis, choledocholithiasis, peptic ulcer, gastritis, gastroenteritis, appendicitis, IBS, IBD, DKA, nephrolithiasis, UTI, pyelonephritis, pancreatitis, diverticulitis, mesenteric ischemia, abdominal aortic aneurysm, small bowel obstruction, volvulus, testicular torsion in males   ED Course:  Upon initial evaluation, patient is well-appearing, stable vital signs.  He is tender to palpation in the right lower quadrant.  Reporting pain 8 out of 10.  Denies any nausea currently, no vomiting.  Labs were obtained earlier at [facility] which were reviewed, see below  Labs Ordered: I Ordered, and personally interpreted labs.  The pertinent results include:   CBC with leukocytosis of 11.1 CMP with hyponatremia at 133, elevated T. bili at 2.3.  No other elevation in AST, ALT, alk phos, or creatinine Lipase within normal limits  Imaging Studies ordered: I ordered imaging studies including CT abdomen and pelvis I independently visualized the imaging with scope of interpretation limited to determining acute life threatening conditions related to emergency care. Imaging showed  Acute appendicitis with small contained microperforation/periappendiceal abscess.  Secondary involvement of the terminal ileum, distal small bowel loop, and cecum.  No bowel obstruction. I agree with the radiologist interpretation    Consultations Obtained: I requested consultation with the general surgery Micheal Maczis PA-C,  and discussed lab and imaging findings as well as pertinent plan - they recommend: Continue cefepime  and flagyl , recommended having cardiology consult to determine operative risk.  They will then determine plan based off of cardiology evaluation.  I requested consultation with cardiology, and discussed lab and imaging findings as well as pertinent plan - they will see patient in consult when transferred.   Medications Given: 50mcg fentanyl  for pain IV metronidazole , cefepime   for appendicitis Benadryl  to prevent medication reaction  Upon re-evaluation, patient still well-appearing.  Remains with stable vitals.  Reports fentanyl  helped with his pain.  I discussed the finding of appendicitis on his CT.  Will start IV metronidazole  and cefepime  for him.  Will avoid penicillins as he states he was told not to take it before, but is unsure what type of reaction he had.  Will dose cefepime  with Benadryl . Will plan on admission to medicine with cardiology and general surgery following.    Impression: Acute appendicitis with microperforation  Disposition:  Admission with Dr Lydia Sams with the hospitalist team at [facility].  Patient will be transferred via [facility].  General surgery and cardiology to consult once patient arrives to [facility].    Record Review: External records from outside source obtained and reviewed including  Last cardiology visit appears to be from 06/21/2020 with Dr. Alroy Aspen     This chart was dictated using voice recognition software, Dragon. Despite the best efforts of this provider to proofread and correct errors, errors may still occur which can change documentation meaning.          Final Clinical Impression(s) / ED Diagnoses Final diagnoses:  Acute appendicitis, unspecified acute  appendicitis type    Rx / DC Orders ED Discharge Orders     None         Rexie Catena, PA-C 11/18/23 1427    Afton Horse T, DO 11/19/23 [phone]

## 2023-11-18 NOTE — ED Notes (Signed)
 Pt was seen at [facility] ed and left before being seen. Says he had blood work

## 2023-11-18 NOTE — Progress Notes (Signed)
 Pt admitted to [facility] from [facility] via [facility]. Pt alert, oriented, history of AFib.

## 2023-11-18 NOTE — Consult Note (Addendum)
 Cardiology Consultation   Patient ID: Gregory Shields MRN: [id number]; DOB: 1950-10-07  Admit date: 11/18/2023 Date of Consult: 11/18/2023  PCP:  [facility]   [facility] Providers Cardiologist:  Ahmad Alert, MD   {    Patient Profile:   Gregory Shields is a 73 y.o. male with a hx of CAD s/p emergent mid/distal LAD stent complicated by apical LV thrombus and CVA in 2015, HLD, paroxysmal A fib/flutter, chronic systolic heart failure,  who is being seen 11/18/2023 for the evaluation of pre-op risk evaluation for appendectomy at the request of Dr. Lydia Sams  History of Present Illness:   Gregory Shields with PMH of CAD s/p emergent mid/distal LAD complicated by apical LV thrombus and CVA in 2015,  HTN, HLD, paroxysmal A fib/flutter, chronic systolic heart failure, who is transferred from [facility] to [facility] for pre-oo evaluation before appendectomy.   Per chart review, he follows Dr. Alroy Aspen outpatient (previously seeing Dr. Glena Landau), he has not been seen since 06/21/2020. He suffered anterior MI required emergent PCI with mid to distal LAD stent placement in 2005, this was complicated by apical LV thrombus and stroke 1 week following MI.  He had overall good recovery.  He is on baby aspirin, Crestor for medical therapy and self discontinued Coreg previously.   He was diagnosed with paroxysmal A-fib/flutter over the past few years, was placed on amiodarone at one point, this was stopped for unclear indication.  He is historically anticoagulated on Eliquis, self discontinued Eliquis in 2011, this was resumed during office appointment on 06/21/2020, and he had reported symptoms concerning for TIA at the time.   Most recent echo was completed in 04/22/2020 revealing LVEF 45 to 50%, unable to assess diastolic function, akinesis of left ventricular, apical anterior septal wall, anterior segment and apical segment; mildly reduced RV, normal PASP, severe LAE and RAE, mild MR, mild  dilatation of ascending aorta 40 mm.   Upon encounter he states he has been doing well, exercises regularly, no chest pain, SOB, rapid weight gain, leg edema, orthopnea, PND. He takes ASA 81mg , self discontinued statin and Eliquis for several years. He just did not like the idea he needs to take certain medication for a lifelong. He states he is taking care of his body. He had ongoing nausea without emesis, abdominal pain since Tuesday. He denied any fever or chills. He states he is in A fib for sometime, does not have much symptoms.   Admission diagnostic today revealed hyponatremia 133, albumin 3.3, total bilirubin 2.3 on CMP.  CBC differential revealed leukocytosis 11 100. CT abdomen and pelvis revealed acute appendicitis with small contained microperforation/periappendiceal abscess.  Cardiology is consulted for preop evaluation of appendectomy today.      Past Medical History:  Diagnosis Date   Coronary artery disease    Depression    Hypertension    Myocardial infarction [facility])    Sleep apnea    Stroke [facility])     Past Surgical History:  Procedure Laterality Date   LEFT HEART CATHETERIZATION WITH CORONARY ANGIOGRAM N/A 05/12/2014   Procedure: LEFT HEART CATHETERIZATION WITH CORONARY ANGIOGRAM;  Surgeon: Sharene Dauer, MD;  Location: [facility] CATH LAB;  Service: Cardiovascular;  Laterality: N/A;     Home Medications:  Prior to Admission medications   Medication Sig Start Date End Date Taking? Authorizing Provider  aspirin EC 325 MG tablet Take 162.5 mg by mouth daily as needed (for headaches).   Yes [provider]  B  Complex Vitamins (VITAMIN B COMPLEX) TABS Take 1 tablet by mouth daily with breakfast.   Yes [provider]  Cholecalciferol (VITAMIN D3 PO) Take 1 capsule by mouth daily.   Yes [provider]  Multiple Vitamins-Minerals (MULTIVITAMIN MEN) TABS Take 1 tablet by mouth daily with breakfast.   Yes [provider]  Omega-3 Fatty Acids (FISH  OIL PO) Take 1 capsule by mouth daily.   Yes [provider]  apixaban (ELIQUIS) 5 MG TABS tablet Take 1 tablet (5 mg total) by mouth 2 (two) times daily. Patient not taking: Reported on 11/18/2023 06/21/20   Nahser, Deloris Ping, MD  aspirin EC 81 MG tablet Take 1 tablet (81 mg total) by mouth daily. Patient not taking: Reported on 11/18/2023 09/30/17   Nahser, Deloris Ping, MD  nitroGLYCERIN (NITROSTAT) 0.4 MG SL tablet Place 1 tablet (0.4 mg total) under the tongue every 5 (five) minutes as needed for chest pain. Patient not taking: Reported on 11/18/2023 09/30/17 11/18/23  Nahser, Deloris Ping, MD  ondansetron (ZOFRAN-ODT) 4 MG disintegrating tablet Take 1 tablet (4 mg total) by mouth every 8 (eight) hours as needed for nausea or vomiting. Patient not taking: Reported on 11/18/2023 05/06/23   Benjiman Core, MD  rosuvastatin (CRESTOR) 10 MG tablet Take 1 tablet (10 mg total) by mouth daily. Patient not taking: Reported on 11/18/2023 06/24/20   Nahser, Deloris Ping, MD    Inpatient Medications: Scheduled Meds:  Continuous Infusions:  ceFEPime (MAXIPIME) IV Stopped (11/18/23 1707)   metronidazole Stopped (11/18/23 1505)   PRN Meds: fentaNYL (SUBLIMAZE) injection, mouth rinse  Allergies:    Allergies  Allergen Reactions   Other Itching and Other (See Comments)    Pollen- Itchy eyes, sneezing, runny nose, etc..   Penicillins Other (See Comments)    Doesn't remember exact allergic reaction    Social History:   Social History   Socioeconomic History   Marital status: Unknown    Spouse name: Not on file   Number of children: 0   Years of education: 16   Highest education level: Not on file  Occupational History   Not on file  Tobacco Use   Smoking status: Never   Smokeless tobacco: Never  Vaping Use   Vaping status: Never Used  Substance and Sexual Activity   Alcohol use: Yes    Comment: OCCASION   Drug use: No   Sexual activity: Yes  Other Topics Concern   Not on file  Social  History Narrative   Patient is married with 2 adopted children.   Patient is left handed.   Patient has 16 yrs of education.   Patient drinks 2 cups daily.   Social Drivers of Corporate investment banker Strain: Not on file  Food Insecurity: Not on file  Transportation Needs: Not on file  Physical Activity: Not on file  Stress: Not on file  Social Connections: Unknown (12/16/2021)   Received from [facility]   Social Network    Social Network: Not on file  Intimate Partner Violence: Unknown (11/07/2021)   [facility] from [facility], [facility]   HITS    Physically Hurt: Not on file    Insult or Talk Down To: Not on file    Threaten Physical Harm: Not on file    Scream or Curse: Not on file    Family History:    Family History  Problem Relation Age of Onset   Heart attack Father  ROS:  Constitutional: Denied fever, chills, malaise, night sweats Eyes: Denied vision change or loss Ears/Nose/Mouth/Throat: Denied ear ache, sore throat, coughing, sinus pain Cardiovascular: see HPI  Respiratory: see HPI  Gastrointestinal: Denied nausea, vomiting, abdominal pain, diarrhea Genital/Urinary: Denied dysuria, hematuria, urinary frequency/urgency Musculoskeletal: Denied muscle ache, joint pain, weakness Skin: Denied rash, wound Neuro: Denied headache, dizziness, syncope Psych: Denied history of depression/anxiety  Endocrine: Denied history of diabetes    Physical Exam/Data:   Vitals:   11/18/23 1500 11/18/23 1706 11/18/23 1707 11/18/23 1810  BP: 134/89  133/75 (!) 140/82  Pulse: (!) 101  91 (!) 106  Resp: 18  20 16   Temp:  98 F (36.7 C)  98.5 F (36.9 C)  TempSrc:  Oral  Oral  SpO2: 99%  98% 99%   No intake or output data in the 24 hours ending 11/18/23 1848    11/18/2023    5:08 AM 05/06/2023    8:31 AM 06/21/2020    4:04 PM  Last 3 Weights  Weight (lbs) 160 lb 160 lb 172 lb 9.6 oz  Weight (kg) 72.576 kg 72.576 kg 78.291 kg     There is no  height or weight on file to calculate BMI.   Vitals:  Vitals:   11/18/23 1707 11/18/23 1810  BP: 133/75 (!) 140/82  Pulse: 91 (!) 106  Resp: 20 16  Temp:  98.5 F (36.9 C)  SpO2: 98% 99%   General Appearance: In no apparent distress, laying in bed, well nourished  HEENT: Normocephalic, atraumatic.  Neck: Supple, trachea midline, no JVDs Cardiovascular: Irregularly irregular,  normal S1-S2,  no murmur Respiratory: Resting breathing unlabored, lungs sounds clear to auscultation bilaterally, no use of accessory muscles. On room air.  Gastrointestinal: Bowel sounds positive, abdomen soft, + tenderness Extremities: Able to move all extremities in bed without difficulty, no edema of BLE Musculoskeletal: Normal muscle bulk and tone Skin: Intact, warm, dry. No rashes or petechiae noted in exposed areas.  Neurologic: Alert, oriented to person, place and time. Fluent speech, no facial droop, no cognitive deficit Psychiatric: Normal affect. Mood is appropriate.      EKG:  The EKG was personally reviewed and demonstrates:    EKG has been ordered but not completed today  Telemetry:  Telemetry was personally reviewed and demonstrates:    A Fib with VR 70-90s  Relevant CV Studies:   Echo from 04/22/2020:  1. Left ventricular ejection fraction, by estimation, is 45 to 50%. The  left ventricle has mildly decreased function. The left ventricle  demonstrates regional wall motion abnormalities (see scoring  diagram/findings for description). Left ventricular  diastolic function could not be evaluated. There is akinesis of the left  ventricular, apical anteroseptal wall, anterior segment and apical  segment.   2. Right ventricular systolic function is mildly reduced. The right  ventricular size is mildly enlarged. There is normal pulmonary artery  systolic pressure.   3. Left atrial size was severely dilated.   4. Right atrial size was severely dilated.   5. The mitral valve is normal  in structure. Mild mitral valve  regurgitation. No evidence of mitral stenosis.   6. The aortic valve is tricuspid. Aortic valve regurgitation is not  visualized. No aortic stenosis is present.   7. Aortic dilatation noted. There is mild dilatation of the ascending  aorta, measuring 40 mm.   8. The inferior vena cava is normal in size with <50% respiratory  variability, suggesting right atrial pressure of 8 mmHg.  Laboratory Data:  High Sensitivity Troponin:  No results for input(s): "TROPONINIHS" in the last 720 hours.   Chemistry Recent Labs  Lab 11/18/23 0526  NA 133*  K 3.9  CL 99  CO2 23  GLUCOSE 114*  BUN 11  CREATININE 0.97  CALCIUM 8.8*  GFRNONAA >60  ANIONGAP 11    Recent Labs  Lab 11/18/23 0526  PROT 6.4*  ALBUMIN 3.3*  AST 17  ALT 13  ALKPHOS 57  BILITOT 2.3*   Lipids No results for input(s): "CHOL", "TRIG", "HDL", "LABVLDL", "LDLCALC", "CHOLHDL" in the last 168 hours.  Hematology Recent Labs  Lab 11/18/23 0526  WBC 11.1*  RBC 4.63  HGB 14.9  HCT 42.2  MCV 91.1  MCH 32.2  MCHC 35.3  RDW 11.9  PLT 235   Thyroid No results for input(s): "TSH", "FREET4" in the last 168 hours.  BNPNo results for input(s): "BNP", "PROBNP" in the last 168 hours.  DDimer No results for input(s): "DDIMER" in the last 168 hours.   Radiology/Studies:  CT ABDOMEN PELVIS W CONTRAST Result Date: 11/18/2023 CLINICAL DATA:  RLQ abdominal pain. EXAM: CT ABDOMEN AND PELVIS WITH CONTRAST TECHNIQUE: Multidetector CT imaging of the abdomen and pelvis was performed using the standard protocol following bolus administration of intravenous contrast. RADIATION DOSE REDUCTION: This exam was performed according to the departmental dose-optimization program which includes automated exposure control, adjustment of the mA and/or kV according to patient size and/or use of iterative reconstruction technique. CONTRAST:  OMNIPAQUE IOHEXOL 300 MG/ML  SOLN COMPARISON:  None Available.  FINDINGS: Lower chest: There are patchy atelectatic changes in the visualized lung bases. No overt consolidation. No pleural effusion. The heart is normal in size. No pericardial effusion. There are coronary artery atherosclerotic calcifications, in keeping with coronary artery disease. Hepatobiliary: The liver is normal in size. Non-cirrhotic configuration. No suspicious mass. These is mild diffuse hepatic steatosis. No intrahepatic or extrahepatic bile duct dilation. No calcified gallstones. Normal gallbladder wall thickness. No pericholecystic inflammatory changes. Pancreas: Unremarkable. No pancreatic ductal dilatation or surrounding inflammatory changes. Spleen: Within normal limits. No focal lesion. Adrenals/Urinary Tract: Adrenal glands are unremarkable. No suspicious renal mass. 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 No hydronephrosis. No renal or ureteric calculi. Unremarkable urinary bladder. Stomach/Bowel: There is a tiny sliding hiatal hernia. No disproportionate dilation of the small or large bowel loops. There is dilated appendix measuring up to 13 mm near the apex. There is small fluid collection surrounding the tip which exhibits partial wall formation (series 4, image 49). There is a small focus of calcification in this region. Findings may represent small contained micro perforation/periappendiceal abscess. There is fat stranding surrounding the rest of the appendix as well. There is also mild circumferential thickening of the terminal ileum (series 2, image 52) as well as another distal small bowel loop (series 2, image 58) in the vicinity, which are likely secondarily involved. There is also secondary involvement of the cecum. There is fat stranding and trace fluid surrounding the appendix. Rest of the bowel loops are unremarkable. Vascular/Lymphatic: No ascites or pneumoperitoneum. No abdominal or pelvic lymphadenopathy, by size criteria. No  aneurysmal dilation of the major abdominal arteries. There are moderate peripheral atherosclerotic vascular calcifications of the aorta and its major branches. Reproductive: Normal size prostate. Symmetric seminal vesicles. Other: There is a small to moderate left inguinal hernia containing fat and portion of unobstructed proximal sigmoid colon. The soft tissues and abdominal wall are otherwise  unremarkable. Musculoskeletal: No suspicious osseous lesions. There are moderate multilevel degenerative changes in the visualized spine. IMPRESSION: 1. Findings compatible with acute appendicitis with small contained micro perforation/periappendiceal abscess. There is secondary involvement of the terminal ileum, distal small bowel loop and cecum. No bowel obstruction. 2. Multiple other nonacute observations, as described above. Aortic Atherosclerosis (ICD10-I70.0). Electronically Signed   By: Beula Brunswick M.D.   On: 11/18/2023 11:23     Assessment and Plan:   Preop risk evaluation for appendectomy - RCRI 3, 11% perioperative risk of major cardiac event - DASI 42.7, 7.99 functional METS - He said moderate to high risk given history of CAD, chronic systolic heart failure, CVA, hyperlipidemia, he has no concerning cardiac symptoms at this time, no prohibitive elements for planned surgery if this is the best option for his appendicitis, do not plan for additional cardiac workup before surgery at this time  Chronic systolic heart failure - Euvolemic on exam today, no CHF symptoms for the past several years while not followed up  - Will update echo for baseline evaluation, LVEF 45 to 50% (stable) dating back 04/22/2020 - GDMT: Self discontinued Coreg several years ago, not currently any meds, pending further recommendation based off echo  Coronary artery disease with LAD stent 2005 - No angina concerning symptoms - on medical therapy with aspirin 81 mg self stopped Crestor 10 mg, given the need of DOAC at  baseline, can potentially drop aspirin to reduce risk of bleeding if he agrees to go back on Eliquis   Paroxysmal A-fib - EKGs pending, telemetry with A fib with controlled ventricular rate, no symptoms  - self discontinued Eliquis for several years, had TIA while off Eliquis in 2021, recommend to resume Eliquis after surgery once cleared by the surgeon, he will think over this  - CHA2DS2-VASc score is 5  Appendicitis History of CVA Hyperlipidemia - Per primary team   Risk Assessment/Risk Scores:    New York  Heart Association (NYHA) Functional Class NYHA Class I  CHA2DS2-VASc Score = 5  This indicates a 7.2% annual risk of stroke. The patient's score is based upon: CHF History: 1 HTN History: 0 Diabetes History: 0 Stroke History: 2 Vascular Disease History: 1 Age Score: 1 Gender Score: 0        For questions or updates, please contact [facility] Please consult [email or web address] for contact info under    Signed, Xika Zhao, NP  11/18/2023 6:48 PM  I have personally seen and examined the patient.  My HPI, Exam, and assessment and plan are below, independent of the NPP above.  Gregory Shields is a 73 year old with coronary artery disease and heart failure who presents with acute appendicitis.  He has a history of coronary artery disease with a prior anterior myocardial infarction. Despite this, he remains asymptomatic with no clinical signs of heart failure or angina and is active, exercising regularly. He is rate controlled on his current regimen.  He has heart failure with preserved ejection fraction or heart failure with recovered ejection fraction. The last echocardiogram in September 2021 showed apical hypokinesis, mild aortic dilation, and was consistent with an anterior infarct. A stress test from November 2015 also reported an anterior infarct. He has calcium on the left ventricular apex consistent with dystrophic myocardial calcification from tissue damage and  healing after his myocardial infarction. Despite this, he has had no evidence of ventricular tachycardia.  He has aortic atherosclerosis and RCA calcifications. He also has COPD but has never smoked.  He is very active, engaging in running and other exercises, and has not experienced any issues with atrial fibrillation or decreased heart function. No new chest pain or pressure, no swelling in legs, or shortness of breath.  He has a history of atrial fibrillation, likely longstanding persistent since around 2022, but he is not symptomatic from it. He has had a stroke and TIA in the past, which increases his risk for future strokes.  He is currently experiencing acute appendicitis, which was identified on a CT scan performed to evaluate his condition.  Exam notable for  GENERAL: No acute distress. CARDIOVASCULAR: Regular rhythm with systolic murmur. JVD when supine. ABDOMEN: Hyperactive bowel sounds. EXTREMITIES: No lower extremity swelling; legs warm. Integument: Skin feels warm  Neuro:  At time of evaluation, alert and oriented to person/place/time/situation  Psych: Normal affect, patient feels well  RADIOLOGY CT aorta: Calcium on left ventricular apex, dystrophic myocardial calcification, aortic atherosclerosis, RCA calcifications  DIAGNOSTIC Echocardiogram: Apical hypokinesis, mild aortic dilation (04/22/2020) Stress test: Anterior infarct (06/27/2014) Telemetry: Atrial fibrillation, PVCs in couplets  Tele: Rate controlled atrial fibrillation   In assessment and plan:   Cardiac Risk Stratification Patient is at high risk from his comorbidities but he is well compensated from all of these.  Surgery is reasonable  Coronary Artery Disease with Prior Anterior Myocardial Infarction Coronary artery disease with prior anterior myocardial infarction, showing evidence of dystrophic myocardial calcification. Asymptomatic with no new chest pain or angina, remains very active. - statin at  DC  Atrial Fibrillation Longstanding persistent atrial fibrillation, likely since 2022. Asymptomatic and rate-controlled. Increased thromboembolic risk due to history of stroke and TIA. Anticoagulation therapy deferred due to upcoming surgery. Post-surgery, risk of rapid ventricular response is higher, necessitating rate control. - Discuss anticoagulation therapy post-surgery if appropriate - Monitor for rapid ventricular response post-surgery - Consider beta blockers or calcium channel blockers for rate control if necessary - Consider antiarrhythmic medication if rate control is not achieved  Heart Failure with Reduced Ejection Fraction Heart failure with reduced ejection fraction.  Due to infarct, I am confident it has not normalized completely. Well-compensated with no clinical signs of heart failure save for JVD. remains active. - Consider diuretics if fluid overload occurs post-surgery - an echo is ordered; this should not delay surgery  Aortic Atherosclerosis Aortic atherosclerosis evidenced by calcifications on imaging. Managed as part of overall cardiovascular care. - statin at DC     Gloriann Larger, MD FASE [facility] Cardiologist [facility]  [address] [place], [address] [place], [place] [address] [phone]  8:11 PM

## 2023-11-18 NOTE — ED Notes (Signed)
 Pt stated he had to leave has an dr appt. NT removed IV.

## 2023-11-18 NOTE — ED Notes (Signed)
 Transport to ct

## 2023-11-18 NOTE — H&P (Signed)
 History and Physical    Patient: Gregory Shields RUE:[id number] DOB: [birth year] DOA: 11/18/2023 DOS: the patient was seen and examined on 11/18/2023 PCP: [facility]  Patient coming from: Home  Chief Complaint: No chief complaint on file.  HPI: Gregory Shields is a 73 y.o. male with medical history significant for MI 10 years ago.  He has sleep apnea, stroke, hypertension, depression and atrial fibrillation also listed in his past medical history.  He presents with severe right lower quadrant almost inguinal area pain which started in the middle of the night 2 nights ago.  He was in his usual state of health prior to that.  Since then he has been nauseated but denies vomiting and he has this pain which is worse with abrupt movement.  He had a normal bowel movement within the last 24 hours.  With the pain has not improved.  He came in for evaluation today after having a rough night of severe pain every time he turned over.  His evaluation in the emergency department included a CT scan which revealed acute appendicitis with microperforations.  The surgeon was called and the patient will be admitted to the hospitalist service because of his history of coronary artery disease.  He will be n.p.o. pending surgical evaluation.    Review of Systems: As mentioned in the history of present illness. All other systems reviewed and are negative. Past Medical History:  Diagnosis Date   Coronary artery disease    Depression    Hypertension    Myocardial infarction [facility])    Sleep apnea    Stroke [facility])    Past Surgical History:  Procedure Laterality Date   LEFT HEART CATHETERIZATION WITH CORONARY ANGIOGRAM N/A 05/12/2014   Procedure: LEFT HEART CATHETERIZATION WITH CORONARY ANGIOGRAM;  Surgeon: Sharene Dauer, MD;  Location: [facility] CATH LAB;  Service: Cardiovascular;  Laterality: N/A;   Social History:  reports that he has never smoked. He has never used smokeless tobacco. He reports  current alcohol use. He reports that he does not use drugs.  Allergies  Allergen Reactions   Other Itching and Other (See Comments)    Pollen- Itchy eyes, sneezing, runny nose, etc..   Penicillins Other (See Comments)    Doesn't remember exact allergic reaction    Family History  Problem Relation Age of Onset   Heart attack Father     Prior to Admission medications   Medication Sig Start Date End Date Taking? Authorizing Provider  aspirin EC 325 MG tablet Take 162.5 mg by mouth daily as needed (for headaches).   Yes [provider]  B Complex Vitamins (VITAMIN B COMPLEX) TABS Take 1 tablet by mouth daily with breakfast.   Yes [provider]  Cholecalciferol (VITAMIN D3 PO) Take 1 capsule by mouth daily.   Yes [provider]  Multiple Vitamins-Minerals (MULTIVITAMIN MEN) TABS Take 1 tablet by mouth daily with breakfast.   Yes [provider]  Omega-3 Fatty Acids (FISH OIL PO) Take 1 capsule by mouth daily.   Yes [provider]  apixaban (ELIQUIS) 5 MG TABS tablet Take 1 tablet (5 mg total) by mouth 2 (two) times daily. Patient not taking: Reported on 11/18/2023 06/21/20   Nahser, Lela Purple, MD  aspirin EC 81 MG tablet Take 1 tablet (81 mg total) by mouth daily. Patient not taking: Reported on 11/18/2023 09/30/17   Nahser, Lela Purple, MD  nitroGLYCERIN (NITROSTAT) 0.4 MG SL tablet Place 1 tablet (0.4 mg total) under  the tongue every 5 (five) minutes as needed for chest pain. Patient not taking: Reported on 11/18/2023 09/30/17 11/18/23  Nahser, Lela Purple, MD  ondansetron (ZOFRAN-ODT) 4 MG disintegrating tablet Take 1 tablet (4 mg total) by mouth every 8 (eight) hours as needed for nausea or vomiting. Patient not taking: Reported on 11/18/2023 05/06/23   Mozell Arias, MD  rosuvastatin (CRESTOR) 10 MG tablet Take 1 tablet (10 mg total) by mouth daily. Patient not taking: Reported on 11/18/2023 06/24/20   Nahser, Lela Purple, MD    Physical  Exam: Vitals:   11/18/23 1706 11/18/23 1707 11/18/23 1810 11/18/23 1944  BP:  133/75 (!) 140/82 125/70  Pulse:  91 (!) 106 93  Resp:  20 16 18   Temp: 98 F (36.7 C)  98.5 F (36.9 C) 98.5 F (36.9 C)  TempSrc: Oral  Oral Oral  SpO2:  98% 99% 100%   Physical Exam:  General: No acute distress, well developed, well nourished HEENT: Normocephalic, atraumatic, PERRL Cardiovascular: Normal rate and rhythm. Distal pulses intact. Pulmonary: Normal pulmonary effort, normal breath sounds Gastrointestinal: Distended abdomen, soft, tender in RLQ, normoactive bowel sounds Musculoskeletal:Normal ROM, no lower ext edema Lymphadenopathy: No cervical LAD. Skin: Skin is warm and dry. Neuro: No focal deficits noted, AAOx3. PSYCH: Attentive and cooperative  Data Reviewed:  Results for orders placed or performed during the hospital encounter of 11/18/23 (from the past 24 hours)  APTT     Status: None   Collection Time: 11/18/23 11:57 AM  Result Value Ref Range   aPTT 34 24 - 36 seconds     Assessment and Plan: Acute appendicitis with microperforations-  - Continue Maxipime and Flagyl - NPO - Surgical evaluation pending - pain control  2.  Inguinal hernia - Defer to surgeon  3. CAD/preserved EF- - appreciate cardiology assessment.  The patient is compensated clinically.  He has no contraindications to surgery. His only medication was a baby aspirin and a statin daily.   4.  Persistent Atrial fibrillation- The patient was not taking Eliquis    Advance Care Planning:   Code Status: Prior  The patient names his wife as his surrogate decision maker and wants to be full code.  Consults: [facility] and general surgery  Family Communication: None  Severity of Illness: The appropriate patient status for this patient is INPATIENT. Inpatient status is judged to be reasonable and necessary in order to provide the required intensity of service to ensure the patient's safety. The  patient's presenting symptoms, physical exam findings, and initial radiographic and laboratory data in the context of their chronic comorbidities is felt to place them at high risk for further clinical deterioration. Furthermore, it is not anticipated that the patient will be medically stable for discharge from the hospital within 2 midnights of admission.   * I certify that at the point of admission it is my clinical judgment that the patient will require inpatient hospital care spanning beyond 2 midnights from the point of admission due to high intensity of service, high risk for further deterioration and high frequency of surveillance required.*  Author: Willadean Hark, MD 11/18/2023 9:13 PM  For on call review [email or web address].

## 2023-11-18 NOTE — ED Triage Notes (Signed)
 Right lower quadrant pain , started Tuesday middle of night. Nausea ,no vomiting.

## 2023-11-18 NOTE — Progress Notes (Signed)
 Pt instructed on IS use x 10 while awake. Notified provider of admission.

## 2023-11-19 ENCOUNTER — Inpatient Hospital Stay (HOSPITAL_COMMUNITY)

## 2023-11-19 DIAGNOSIS — I5020 Unspecified systolic (congestive) heart failure: Secondary | ICD-10-CM

## 2023-11-19 DIAGNOSIS — I5022 Chronic systolic (congestive) heart failure: Secondary | ICD-10-CM

## 2023-11-19 DIAGNOSIS — I4811 Longstanding persistent atrial fibrillation: Secondary | ICD-10-CM | POA: Diagnosis not present

## 2023-11-19 DIAGNOSIS — R1011 Right upper quadrant pain: Secondary | ICD-10-CM

## 2023-11-19 DIAGNOSIS — K3589 Other acute appendicitis without perforation or gangrene: Secondary | ICD-10-CM

## 2023-11-19 DIAGNOSIS — E785 Hyperlipidemia, unspecified: Secondary | ICD-10-CM | POA: Diagnosis present

## 2023-11-19 DIAGNOSIS — I2510 Atherosclerotic heart disease of native coronary artery without angina pectoris: Secondary | ICD-10-CM | POA: Diagnosis present

## 2023-11-19 DIAGNOSIS — I25810 Atherosclerosis of coronary artery bypass graft(s) without angina pectoris: Secondary | ICD-10-CM | POA: Diagnosis not present

## 2023-11-19 LAB — ECHOCARDIOGRAM COMPLETE
Area-P 1/2: 4.8 cm2
Calc EF: 60.4 %
Height: 67.992 in
S' Lateral: 3.5 cm
Single Plane A2C EF: 53.3 %
Single Plane A4C EF: 65.5 %
Weight: 2539.7 [oz_av]

## 2023-11-19 MED ORDER — METRONIDAZOLE 500 MG/100ML IV SOLN
500.0000 mg | Freq: Two times a day (BID) | INTRAVENOUS | Status: DC
  Administered 2023-11-19 – 2023-11-20 (×2): 500 mg via INTRAVENOUS
  Filled 2023-11-19 (×2): qty 100

## 2023-11-19 MED ORDER — ONDANSETRON HCL 4 MG/2ML IJ SOLN: 4.0000 mg | Freq: Four times a day (QID) | INTRAMUSCULAR | Status: DC | PRN

## 2023-11-19 MED ORDER — ACETAMINOPHEN 650 MG RE SUPP: 650.0000 mg | Freq: Four times a day (QID) | RECTAL | Status: DC | PRN

## 2023-11-19 MED ORDER — ACETAMINOPHEN 325 MG PO TABS: 650.0000 mg | ORAL_TABLET | Freq: Four times a day (QID) | ORAL | Status: DC | PRN

## 2023-11-19 MED ORDER — METRONIDAZOLE 500 MG/100ML IV SOLN: 500.0000 mg | Freq: Two times a day (BID) | INTRAVENOUS | Status: DC

## 2023-11-19 MED ORDER — MORPHINE SULFATE (PF) 2 MG/ML IV SOLN: 2.0000 mg | INTRAVENOUS | Status: DC | PRN

## 2023-11-19 MED ORDER — MORPHINE SULFATE (PF) 4 MG/ML IV SOLN: 4.0000 mg | INTRAVENOUS | Status: DC | PRN

## 2023-11-19 MED ORDER — PERFLUTREN LIPID MICROSPHERE
1.0000 mL | INTRAVENOUS | Status: AC | PRN
  Administered 2023-11-19: 2 mL via INTRAVENOUS

## 2023-11-19 MED ORDER — ONDANSETRON HCL 4 MG PO TABS: 4.0000 mg | ORAL_TABLET | Freq: Four times a day (QID) | ORAL | Status: DC | PRN

## 2023-11-19 MED ORDER — BISACODYL 10 MG RE SUPP: 10.0000 mg | Freq: Every day | RECTAL | Status: DC | PRN

## 2023-11-19 NOTE — Progress Notes (Signed)
 Assessment & Plan: HD#2 - acute appendicitis  Appreciate cardiology input  Continue IV abx's today  Clear liquid diet and advance as tolerated  Check CBC in AM 4/19  Discussion with patient regarding options for management - operative vs antibiotics.  Discussed increased cardiac risk.  After discussion, patient would like to pursue non-operative management.  Plan to start diet today.  Continue IV abx's another 24 hours.  Check CBC in AM 4/19.  If patient remains afebrile and pain continues to improve, may go home tomorrow on oral abx's for 7-10 days.  If fails to progress, then OR for appendectomy.  Patient understands and agrees with plans.        Oralee Billow, MD [facility] practice Office: [phone]        Chief Complaint: Acute appendicitis  Subjective: Patient with less pain, comfortable, hungry.  Objective: Vital signs in last 24 hours: Temp:  [97.9 F (36.6 C)-99 F (37.2 C)] 97.9 F (36.6 C) (04/18 0820) Pulse Rate:  [89-108] 89 (04/18 0820) Resp:  [16-20] 16 (04/18 0820) BP: (104-140)/(58-89) 127/72 (04/18 0820) SpO2:  [98 %-100 %] 100 % (04/18 0820) Weight:  [72 kg] 72 kg (04/17 2203) Last BM Date : 11/17/23  Intake/Output from previous day: 04/17 0701 - 04/18 0700 In: 240 [P.O.:240] Out: 200 [Urine:200] Intake/Output this shift: No intake/output data recorded.  Physical Exam: HEENT - sclerae clear, mucous membranes moist Abdomen - soft without distension; mild RLQ tenderness to palpation, no mass, no guarding  Lab Results:  Recent Labs    11/18/23 0526  WBC 11.1*  HGB 14.9  HCT 42.2  PLT 235   BMET Recent Labs    11/18/23 0526  NA 133*  K 3.9  CL 99  CO2 23  GLUCOSE 114*  BUN 11  CREATININE 0.97  CALCIUM  8.8*   PT/INR No results for input(s): "LABPROT", "INR" in the last 72 hours. Comprehensive Metabolic Panel:    Component Value Date/Time   NA 133 (L) 11/18/2023 0526   NA 134 (L) 05/06/2023 0854    NA 139 06/21/2020 1650   NA 141 12/28/2017 1635   K 3.9 11/18/2023 0526   K 4.2 05/06/2023 0854   CL 99 11/18/2023 0526   CL 99 05/06/2023 0854   CO2 23 11/18/2023 0526   CO2 25 05/06/2023 0854   BUN 11 11/18/2023 0526   BUN 14 05/06/2023 0854   BUN 13 06/21/2020 1650   BUN 15 12/28/2017 1635   CREATININE 0.97 11/18/2023 0526   CREATININE 1.11 05/06/2023 0854   GLUCOSE 114 (H) 11/18/2023 0526   GLUCOSE 115 (H) 05/06/2023 0854   CALCIUM  8.8 (L) 11/18/2023 0526   CALCIUM  8.7 (L) 05/06/2023 0854   AST 17 11/18/2023 0526   AST 17 05/06/2023 0854   ALT 13 11/18/2023 0526   ALT 14 05/06/2023 0854   ALKPHOS 57 11/18/2023 0526   ALKPHOS 53 05/06/2023 0854   BILITOT 2.3 (H) 11/18/2023 0526   BILITOT 1.3 (H) 05/06/2023 0854   BILITOT 0.8 06/21/2020 1650   BILITOT 0.9 12/28/2017 1635   PROT 6.4 (L) 11/18/2023 0526   PROT 6.2 (L) 05/06/2023 0854   PROT 6.6 06/21/2020 1650   PROT 6.4 12/28/2017 1635   ALBUMIN 3.3 (L) 11/18/2023 0526   ALBUMIN 3.7 05/06/2023 0854   ALBUMIN 4.5 06/21/2020 1650   ALBUMIN 4.5 12/28/2017 1635    Studies/Results: CT ABDOMEN PELVIS W CONTRAST Result Date: 11/18/2023 CLINICAL DATA:  RLQ abdominal pain. EXAM:  CT ABDOMEN AND PELVIS WITH CONTRAST TECHNIQUE: Multidetector CT imaging of the abdomen and pelvis was performed using the standard protocol following bolus administration of intravenous contrast. RADIATION DOSE REDUCTION: This exam was performed according to the departmental dose-optimization program which includes automated exposure control, adjustment of the mA and/or kV according to patient size and/or use of iterative reconstruction technique. CONTRAST:  OMNIPAQUE  IOHEXOL  300 MG/ML  SOLN COMPARISON:  None Available. FINDINGS: Lower chest: There are patchy atelectatic changes in the visualized lung bases. No overt consolidation. No pleural effusion. The heart is normal in size. No pericardial effusion. There are coronary artery atherosclerotic  calcifications, in keeping with coronary artery disease. Hepatobiliary: The liver is normal in size. Non-cirrhotic configuration. No suspicious mass. These is mild diffuse hepatic steatosis. No intrahepatic or extrahepatic bile duct dilation. No calcified gallstones. Normal gallbladder wall thickness. No pericholecystic inflammatory changes. Pancreas: Unremarkable. No pancreatic ductal dilatation or surrounding inflammatory changes. Spleen: Within normal limits. No focal lesion. Adrenals/Urinary Tract: Adrenal glands are unremarkable. No suspicious renal mass. 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 No hydronephrosis. No renal or ureteric calculi. Unremarkable urinary bladder. Stomach/Bowel: There is a tiny sliding hiatal hernia. No disproportionate dilation of the small or large bowel loops. There is dilated appendix measuring up to 13 mm near the apex. There is small fluid collection surrounding the tip which exhibits partial wall formation (series 4, image 49). There is a small focus of calcification in this region. Findings may represent small contained micro perforation/periappendiceal abscess. There is fat stranding surrounding the rest of the appendix as well. There is also mild circumferential thickening of the terminal ileum (series 2, image 52) as well as another distal small bowel loop (series 2, image 58) in the vicinity, which are likely secondarily involved. There is also secondary involvement of the cecum. There is fat stranding and trace fluid surrounding the appendix. Rest of the bowel loops are unremarkable. Vascular/Lymphatic: No ascites or pneumoperitoneum. No abdominal or pelvic lymphadenopathy, by size criteria. No aneurysmal dilation of the major abdominal arteries. There are moderate peripheral atherosclerotic vascular calcifications of the aorta and its major branches. Reproductive: Normal size prostate. Symmetric seminal vesicles. Other: There  is a small to moderate left inguinal hernia containing fat and portion of unobstructed proximal sigmoid colon. The soft tissues and abdominal wall are otherwise unremarkable. Musculoskeletal: No suspicious osseous lesions. There are moderate multilevel degenerative changes in the visualized spine. IMPRESSION: 1. Findings compatible with acute appendicitis with small contained micro perforation/periappendiceal abscess. There is secondary involvement of the terminal ileum, distal small bowel loop and cecum. No bowel obstruction. 2. Multiple other nonacute observations, as described above. Aortic Atherosclerosis (ICD10-I70.0). Electronically Signed   By: Beula Brunswick M.D.   On: 11/18/2023 11:23      Oralee Billow 11/19/2023  Patient ID: Gregory Shields, male   DOB: 07/21/1951, 73 y.o.   MRN: [id number]

## 2023-11-19 NOTE — Progress Notes (Signed)
 Progress Note  Patient Name: Gregory Shields Date of Encounter: 11/19/2023 Primary Cardiologist: Ahmad Alert, MD   Subjective   Overnight no events. Potential surgery today.  Vital Signs    Vitals:   11/18/23 1944 11/18/23 2203 11/19/23 0001 11/19/23 0359  BP: 125/70  (!) 118/58 119/78  Pulse: 93  (!) 102 94  Resp: 18  16 16   Temp: 98.5 F (36.9 C)  98.3 F (36.8 C) 99 F (37.2 C)  TempSrc: Oral  Oral Oral  SpO2: 100%  99% 98%  Weight:  72 kg    Height:  5' 7.99" (1.727 m)      Intake/Output Summary (Last 24 hours) at 11/19/2023 0812 Last data filed at 11/19/2023 0001 Gross per 24 hour  Intake 240 ml  Output 200 ml  Net 40 ml   Filed Weights   11/18/23 2203  Weight: 72 kg    Physical Exam   GEN: No acute distress.   Neck: No JVD Cardiac: iRRR, no murmurs, rubs, or gallops.  Respiratory: Clear to auscultation bilaterally. GI: Soft, but tender on palpation MS: No edema, Slight bleeding from left AC site  Labs   Telemetry: AF with rare PVCs   Chemistry Recent Labs  Lab 11/18/23 0526  NA 133*  K 3.9  CL 99  CO2 23  GLUCOSE 114*  BUN 11  CREATININE 0.97  CALCIUM  8.8*  PROT 6.4*  ALBUMIN 3.3*  AST 17  ALT 13  ALKPHOS 57  BILITOT 2.3*  GFRNONAA >60  ANIONGAP 11     Hematology Recent Labs  Lab 11/18/23 0526  WBC 11.1*  RBC 4.63  HGB 14.9  HCT 42.2  MCV 91.1  MCH 32.2  MCHC 35.3  RDW 11.9  PLT 235     Cardiac Studies   Cardiac Studies & Procedures   ______________________________________________________________________________________________   STRESS TESTS  NM MYOCAR MULTI W/SPECT W 06/27/2014  Narrative CLINICAL DATA:  Chest pain with breathing.  Initial encounter.  EXAM: MYOCARDIAL IMAGING WITH SPECT (REST AND EXERCISE)  GATED LEFT VENTRICULAR WALL MOTION STUDY  LEFT VENTRICULAR EJECTION FRACTION  TECHNIQUE: Standard myocardial SPECT imaging was performed after resting intravenous injection of 10 mCi Tc-26m  sestamibi. Subsequently, exercise tolerance test was performed by the patient under the supervision of the Cardiology staff. At peak-stress, 30 mCi Tc-56m sestamibi was injected intravenously and standard myocardial SPECT imaging was performed. Quantitative gated imaging was also performed to evaluate left ventricular wall motion, and estimate left ventricular ejection fraction.  COMPARISON:  None.  FINDINGS: Perfusion: There is decreased radiotracer uptake in the anterior and inferior walls. No reversible ischemia. Decreased uptake is marked at the apex. Severe perfusion defect at the apex. Moderate to severe decreased uptake in the anterior and inferior wall. In aggregate, the areas of fixed defect (scar) are large and there is associated LEFT ventricular dilation.  Wall Motion: Diffuse global hypokinesis, most pronounced at the apex.  Left Ventricular Ejection Fraction: Diminished at 34%.  End diastolic volume 176 ml, mildly abnormal.  End systolic volume 116 ml, severely abnormal.  IMPRESSION: 1. No reversible ischemia or infarction. Large areas of anterior and inferior wall as well is septal decreased radiotracer uptake.  2. Global hypokinesis with profound hypokinesis at the apex.  3. Left ventricular ejection fraction 34%  4. High-risk stress test findings*.  *2012 Appropriate Use Criteria for Coronary Revascularization Focused Update: J Am Coll Cardiol. 2012;59(9):857-881. http://content.dementiazones.com.aspx?articleid=1201161   Electronically Signed By: Errol Heaps M.D. On: 06/27/2014 14:12  ECHOCARDIOGRAM  ECHOCARDIOGRAM COMPLETE 04/22/2020  Narrative ECHOCARDIOGRAM REPORT    Patient Name:   Gregory Shields  Date of Exam: 04/22/2020 Medical Rec #:  [id number]     Height:       67.0 in Accession #:    [id number]    Weight:       175.0 lb Date of Birth:  16-Mar-1951    BSA:          1.911 m Patient Age:    73 years      BP:           132/84  mmHg Patient Gender: M             HR:           64 bpm. Exam Location:  [address]  Procedure: 2D Echo, 3D Echo, Cardiac Doppler and Color Doppler  Indications:    I25.10 CAD  History:        Patient has prior history of Echocardiogram examinations. CHF, CAD and Previous Myocardial Infarction, Stroke, Arrythmias:Atrial Fibrillation and Atrial Flutter; Risk Factors:Sleep Apnea and Family History of Coronary Artery Disease.  Sonographer:    Ewing Holiday RDCS Referring Phys: [phone] PHILIP J NAHSER  IMPRESSIONS   1. Left ventricular ejection fraction, by estimation, is 45 to 50%. The left ventricle has mildly decreased function. The left ventricle demonstrates regional wall motion abnormalities (see scoring diagram/findings for description). Left ventricular diastolic function could not be evaluated. There is akinesis of the left ventricular, apical anteroseptal wall, anterior segment and apical segment. 2. Right ventricular systolic function is mildly reduced. The right ventricular size is mildly enlarged. There is normal pulmonary artery systolic pressure. 3. Left atrial size was severely dilated. 4. Right atrial size was severely dilated. 5. The mitral valve is normal in structure. Mild mitral valve regurgitation. No evidence of mitral stenosis. 6. The aortic valve is tricuspid. Aortic valve regurgitation is not visualized. No aortic stenosis is present. 7. Aortic dilatation noted. There is mild dilatation of the ascending aorta, measuring 40 mm. 8. The inferior vena cava is normal in size with <50% respiratory variability, suggesting right atrial pressure of 8 mmHg.  Comparison(s): Prior images unable to be directly viewed, comparison made by report only.  FINDINGS Left Ventricle: Left ventricular ejection fraction, by estimation, is 45 to 50%. The left ventricle has mildly decreased function. The left ventricle demonstrates regional wall motion abnormalities. The left  ventricular internal cavity size was normal in size. There is no left ventricular hypertrophy. Left ventricular diastolic function could not be evaluated due to atrial fibrillation. Left ventricular diastolic function could not be evaluated.  Right Ventricle: The right ventricular size is mildly enlarged. Right vetricular wall thickness was not well visualized. Right ventricular systolic function is mildly reduced. There is normal pulmonary artery systolic pressure. The tricuspid regurgitant velocity is 2.26 m/s, and with an assumed right atrial pressure of 8 mmHg, the estimated right ventricular systolic pressure is 28.4 mmHg.  Left Atrium: Left atrial size was severely dilated.  Right Atrium: Right atrial size was severely dilated.  Pericardium: There is no evidence of pericardial effusion.  Mitral Valve: The mitral valve is normal in structure. Mild mitral valve regurgitation. No evidence of mitral valve stenosis.  Tricuspid Valve: The tricuspid valve is normal in structure. Tricuspid valve regurgitation is mild . No evidence of tricuspid stenosis.  Aortic Valve: The aortic valve is tricuspid. Aortic valve regurgitation is not visualized. No aortic stenosis is present.  Pulmonic Valve: The  pulmonic valve was not well visualized. Pulmonic valve regurgitation is trivial. No evidence of pulmonic stenosis.  Aorta: Aortic dilatation noted. There is mild dilatation of the ascending aorta, measuring 40 mm.  Venous: The inferior vena cava is normal in size with less than 50% respiratory variability, suggesting right atrial pressure of 8 mmHg.  IAS/Shunts: The atrial septum is grossly normal.   LEFT VENTRICLE PLAX 2D LVIDd:         4.80 cm  Diastology LVIDs:         3.00 cm  LV e' medial:    7.18 cm/s LV PW:         0.80 cm  LV E/e' medial:  11.1 LV IVS:        0.70 cm  LV e' lateral:   13.20 cm/s LVOT diam:     2.20 cm  LV E/e' lateral: 6.0 LV SV:         70 LV SV Index:   37 LVOT  Area:     3.80 cm  3D Volume EF: 3D EF:        53 % LV EDV:       152 ml LV ESV:       72 ml LV SV:        80 ml  RIGHT VENTRICLE RV S prime:     8.49 cm/s TAPSE (M-mode): 1.3 cm  LEFT ATRIUM              Index       RIGHT ATRIUM           Index LA diam:        4.20 cm  2.20 cm/m  RA Area:     32.50 cm LA Vol (A2C):   129.0 ml 67.52 ml/m RA Volume:   128.00 ml 66.99 ml/m LA Vol (A4C):   161.0 ml 84.26 ml/m LA Biplane Vol: 148.0 ml 77.46 ml/m AORTIC VALVE LVOT Vmax:   73.70 cm/s LVOT Vmean:  54.000 cm/s LVOT VTI:    0.184 m  AORTA Ao Root diam: 3.70 cm Ao Asc diam:  4.00 cm  MITRAL VALVE               TRICUSPID VALVE MV Area (PHT): cm         TR Peak grad:   20.4 mmHg MV Decel Time: 232 msec    TR Vmax:        226.00 cm/s MV E velocity: 79.40 cm/s SHUNTS Systemic VTI:  0.18 m Systemic Diam: 2.20 cm  Sheryle Donning MD Electronically signed by Sheryle Donning MD Signature Date/Time: 04/22/2020/11:40:59 PM    Final          ______________________________________________________________________________________________           Assessment & Plan   Cardiac Risk Stratification - Patient is at high risk based on his comorbidities (prior MI, HF, CVA) but he is well compensated from all of these.  Low risk surgery is being considered - Surgery is reasonable - Post operatively we will re-discuss DOAC   Coronary Artery Disease with Prior Anterior Myocardial Infarction - resume statin at DC  Aortic Atherosclerosis - statin at DC   Atrial Fibrillation Longstanding persistent atrial fibrillation - rate controlled with no intervention - post operative, would offer resumption of eliquis  if safe to do so  Chronic Heart Failure with Reduced Ejection Fraction - NYHA I, slightly hypervolemic, asymptomatic on no GDMT - likely related to prior infarct - echo was ordered  4/17 to re-establish a baseline  Will get follow up with Dr. Letta Raw  team   For questions or updates, please contact [facility] Please consult [email or web address] for contact info under Cardiology/STEMI.      Gloriann Larger, MD FASE [facility] Cardiologist [facility]  [address] [place], [address] [place], [place] [address] [phone]  8:12 AM

## 2023-11-19 NOTE — Hospital Course (Signed)
 Patient with PMH of CAD, chronic A-fib as well as chronic HFrEF, HLD, HTN presenting to the hospital with complaints of right-sided pain.  Was found to have a appendicitis.  General surgery was consulted.  Recommended medical admission and conservative measures.  Cardiology was consulted for preop clearance.  Assessment and Plan: Acute appendicitis with microperforation. Presents with right lower quadrant pain.  CT abdomen shows evidence of acute appendicitis with small contained microperforation as well as abscess.  There is involvement of the terminal ileum but so far no bowel obstruction. She denies any nausea or vomiting. Currently on IV antibiotic. Surgery recommending to advance the diet. General surgery and patient was agrees to manage this condition conservatively.  CAD Chronic HFrEF Persistent A-fib. On statin.   Currently rate controlled. EF 45 to 50% with hypokinesis of anterior lateral and apical wall segments. Would recommend to continue 81 mg aspirin , Eliquis , statin. Not on any GDMT medication.  Will follow cardiology recommendation.  Left inguinal hernia. No obstruction.  Incidentally seen on CT scan. Management per surgery. {Problem list (Optional):1}

## 2023-11-19 NOTE — Progress Notes (Signed)
 [facility] Progress Note Patient: Gregory Shields FMW:[id number] DOB: Oct 29, 1950 DOA: 11/18/2023  DOS: the patient was seen and examined on 11/19/2023  Brief Hospital Course: Patient with PMH of CAD, chronic A-fib as well as chronic HFrEF, HLD, HTN presenting to the hospital with complaints of right-sided pain.  Was found to have a appendicitis.  General surgery was consulted.  Recommended medical admission and conservative measures.  Cardiology was consulted for preop clearance.  Assessment and Plan: Acute appendicitis with microperforation. Presents with right lower quadrant pain.  CT abdomen shows evidence of acute appendicitis with small contained microperforation as well as abscess.  There is involvement of the terminal ileum but so far no bowel obstruction. She denies any nausea or vomiting. Currently on IV antibiotic. Surgery recommending to advance the diet. General surgery and patient was agrees to manage this condition conservatively.  CAD Chronic HFrEF Persistent A-fib. On statin.   Currently rate controlled. EF 45 to 50% with hypokinesis of anterior lateral and apical wall segments. Would recommend to continue 81 mg aspirin , Eliquis , statin. Not on any GDMT medication.  Will follow cardiology recommendation.  Left inguinal hernia. No obstruction.  Incidentally seen on CT scan. Management per surgery.     Subjective: No nausea no vomiting.  Pain well-controlled.  No fever no chills.  Had a BM.  Physical Exam: General: in Mild distress, No Rash Cardiovascular: S1 and S2 Present, No Murmur Respiratory: Good respiratory effort, Bilateral Air entry present. No Crackles, No wheezes Abdomen: Bowel Sound present, mild RLQ tenderness Extremities: No edema Neuro: Alert and oriented x3, no new focal deficit  Data Reviewed: I have Reviewed nursing notes, Vitals, and Lab results. Since last encounter, pertinent lab results CBC and BMP   . I have ordered test including CBC  and BMP  .   Disposition: Status is: Inpatient Remains inpatient appropriate because: Requiring IV antibiotics  Place and maintain sequential compression device Start: 11/19/23 0814   Family Communication: No one at bedside Level of care: Telemetry Medical   Vitals:   11/19/23 0001 11/19/23 0359 11/19/23 0820 11/19/23 1554  BP: (!) 118/58 119/78 127/72 115/63  Pulse: (!) 102 94 89 (!) 103  Resp: 16 16 16 16   Temp: 98.3 F (36.8 C) 99 F (37.2 C) 97.9 F (36.6 C) 98 F (36.7 C)  TempSrc: Oral Oral Oral Oral  SpO2: 99% 98% 100% 99%  Weight:      Height:         Author: Yetta Blanch, MD 11/19/2023 7:30 PM  Please look on [email or web address] to find out who is on call.

## 2023-11-20 DIAGNOSIS — K3580 Unspecified acute appendicitis: Secondary | ICD-10-CM

## 2023-11-20 LAB — CBC WITH DIFFERENTIAL/PLATELET
Abs Immature Granulocytes: 0.04 10*3/uL (ref 0.00–0.07)
Basophils Absolute: 0 10*3/uL (ref 0.0–0.1)
Basophils Relative: 1 %
Eosinophils Absolute: 0.2 10*3/uL (ref 0.0–0.5)
Eosinophils Relative: 3 %
HCT: 38.1 % — ABNORMAL LOW (ref 39.0–52.0)
Hemoglobin: 13.5 g/dL (ref 13.0–17.0)
Immature Granulocytes: 1 %
Lymphocytes Relative: 14 %
Lymphs Abs: 1.1 10*3/uL (ref 0.7–4.0)
MCH: 31.5 pg (ref 26.0–34.0)
MCHC: 35.4 g/dL (ref 30.0–36.0)
MCV: 89 fL (ref 80.0–100.0)
Monocytes Absolute: 0.7 10*3/uL (ref 0.1–1.0)
Monocytes Relative: 9 %
Neutro Abs: 5.5 10*3/uL (ref 1.7–7.7)
Neutrophils Relative %: 72 %
Platelets: 221 10*3/uL (ref 150–400)
RBC: 4.28 MIL/uL (ref 4.22–5.81)
RDW: 11.7 % (ref 11.5–15.5)
WBC: 7.6 10*3/uL (ref 4.0–10.5)
nRBC: 0 % (ref 0.0–0.2)

## 2023-11-20 LAB — BASIC METABOLIC PANEL WITH GFR
Anion gap: 10 (ref 5–15)
BUN: 12 mg/dL (ref 8–23)
CO2: 22 mmol/L (ref 22–32)
Calcium: 8.3 mg/dL — ABNORMAL LOW (ref 8.9–10.3)
Chloride: 102 mmol/L (ref 98–111)
Creatinine, Ser: 0.94 mg/dL (ref 0.61–1.24)
GFR, Estimated: >60 mL/min (ref >60)
Glucose, Bld: 100 mg/dL — ABNORMAL HIGH (ref 70–99)
Potassium: 3.5 mmol/L (ref 3.5–5.1)
Sodium: 134 mmol/L — ABNORMAL LOW (ref 135–145)

## 2023-11-20 MED ORDER — AMOXICILLIN-POT CLAVULANATE 875-125 MG PO TABS: 1.0000 | ORAL_TABLET | Freq: Two times a day (BID) | ORAL | 0 refills | Status: DC

## 2023-11-20 NOTE — Progress Notes (Signed)
 Assessment & Plan: HD#3 - acute appendicitis             Tolerating diet, loose BM's             WBC 7.6 this AM, afebrile, minimal pain  OK for discharge home today on oral Augmentin  - Rx entered to pharmacy  Will arrange follow up at [facility] office in 2 weeks        Oralee Billow, MD [facility] practice Office: [phone]        Chief Complaint: Acute appendicitis  Subjective: Patient comfortable, ordering breakfast.  Minimal RLQ pain.  Objective: Vital signs in last 24 hours: Temp:  [98 F (36.7 C)-99.5 F (37.5 C)] 98.2 F (36.8 C) (04/19 0804) Pulse Rate:  [70-103] 70 (04/19 0804) Resp:  [16-18] 18 (04/19 0804) BP: (115-125)/(63-79) 123/70 (04/19 0804) SpO2:  [99 %] 99 % (04/19 0804) Last BM Date : 11/17/23  Intake/Output from previous day: 04/18 0701 - 04/19 0700 In: -  Out: 300 [Urine:300] Intake/Output this shift: No intake/output data recorded.  Physical Exam: HEENT - sclerae clear, mucous membranes moist Abdomen - soft without distension; minimal tenderness RLQ; no mass; no guarding  Lab Results:  Recent Labs    11/18/23 0526 11/20/23 0557  WBC 11.1* 7.6  HGB 14.9 13.5  HCT 42.2 38.1*  PLT 235 221   BMET Recent Labs    11/18/23 0526 11/20/23 0557  NA 133* 134*  K 3.9 3.5  CL 99 102  CO2 23 22  GLUCOSE 114* 100*  BUN 11 12  CREATININE 0.97 0.94  CALCIUM  8.8* 8.3*   PT/INR No results for input(s): "LABPROT", "INR" in the last 72 hours. Comprehensive Metabolic Panel:    Component Value Date/Time   NA 134 (L) 11/20/2023 0557   NA 133 (L) 11/18/2023 0526   NA 139 06/21/2020 1650   NA 141 12/28/2017 1635   K 3.5 11/20/2023 0557   K 3.9 11/18/2023 0526   CL 102 11/20/2023 0557   CL 99 11/18/2023 0526   CO2 22 11/20/2023 0557   CO2 23 11/18/2023 0526   BUN 12 11/20/2023 0557   BUN 11 11/18/2023 0526   BUN 13 06/21/2020 1650   BUN 15 12/28/2017 1635   CREATININE 0.94 11/20/2023 0557   CREATININE 0.97  11/18/2023 0526   GLUCOSE 100 (H) 11/20/2023 0557   GLUCOSE 114 (H) 11/18/2023 0526   CALCIUM  8.3 (L) 11/20/2023 0557   CALCIUM  8.8 (L) 11/18/2023 0526   AST 17 11/18/2023 0526   AST 17 05/06/2023 0854   ALT 13 11/18/2023 0526   ALT 14 05/06/2023 0854   ALKPHOS 57 11/18/2023 0526   ALKPHOS 53 05/06/2023 0854   BILITOT 2.3 (H) 11/18/2023 0526   BILITOT 1.3 (H) 05/06/2023 0854   BILITOT 0.8 06/21/2020 1650   BILITOT 0.9 12/28/2017 1635   PROT 6.4 (L) 11/18/2023 0526   PROT 6.2 (L) 05/06/2023 0854   PROT 6.6 06/21/2020 1650   PROT 6.4 12/28/2017 1635   ALBUMIN 3.3 (L) 11/18/2023 0526   ALBUMIN 3.7 05/06/2023 0854   ALBUMIN 4.5 06/21/2020 1650   ALBUMIN 4.5 12/28/2017 1635    Studies/Results: ECHOCARDIOGRAM COMPLETE Result Date: 11/19/2023    ECHOCARDIOGRAM REPORT   Patient Name:   PAVLOS YON Date of Exam: 11/19/2023 Medical Rec #:  [id number]    Height:       68.0 in Accession #:    [id number]   Weight:  158.7 lb Date of Birth:  08-01-1951   BSA:          1.853 m Patient Age:    72 years     BP:           124/72 mmHg Patient Gender: M            HR:           86 bpm. Exam Location:  Inpatient Procedure: 2D Echo, Cardiac Doppler, Color Doppler and Intracardiac            Opacification Agent (Both Spectral and Color Flow Doppler were            utilized during procedure). Indications:    I50.40* Unspecified combined systolic (congestive) and diastolic                 (congestive) heart failure  History:        Patient has prior history of Echocardiogram examinations, most                 recent 04/22/2020. Previous Myocardial Infarction, Abnormal ECG,                 Stroke, Arrythmias:Atrial Fibrillation and Atrial Flutter; Risk                 Factors:Hypertension and Sleep Apnea. LV thrombus.  Sonographer:    Raynelle Callow RDCS Referring Phys: [id number] [facility]  Sonographer Comments: Technically difficult study due to poor echo windows. IMPRESSIONS  1. Hypokinesis/akinesis of the distal  anterior, distal inferior and apical wall segments . Left ventricular ejection fraction, by estimation, is 45 to 50%. The left ventricle has mildly decreased function.  2. Right ventricular systolic function is normal. The right ventricular size is normal. There is normal pulmonary artery systolic pressure. The estimated right ventricular systolic pressure is 33.4 mmHg.  3. Left atrial size was moderately dilated.  4. The mitral valve is normal in structure. Mild mitral valve regurgitation.  5. The aortic valve is tricuspid. Aortic valve regurgitation is not visualized.  6. Aortic dilatation noted. There is mild dilatation of the ascending aorta, measuring 40 mm.  7. The inferior vena cava is normal in size with greater than 50% respiratory variability, suggesting right atrial pressure of 3 mmHg. Comparison(s): The left ventricular function is unchanged. FINDINGS  Left Ventricle: Hypokinesis/akinesis of the distal anterior, distal inferior and apical wall segments. Left ventricular ejection fraction, by estimation, is 45 to 50%. The left ventricle has mildly decreased function. Definity  contrast agent was given IV to delineate the left ventricular endocardial borders. The left ventricular internal cavity size was normal in size. There is no left ventricular hypertrophy. Right Ventricle: The right ventricular size is normal. Right vetricular wall thickness was not assessed. Right ventricular systolic function is normal. There is normal pulmonary artery systolic pressure. The tricuspid regurgitant velocity is 2.52 m/s, and with an assumed right atrial pressure of 8 mmHg, the estimated right ventricular systolic pressure is 33.4 mmHg. Left Atrium: Left atrial size was moderately dilated. Right Atrium: Right atrial size was normal in size. Pericardium: There is no evidence of pericardial effusion. Mitral Valve: The mitral valve is normal in structure. Mild mitral valve regurgitation. Tricuspid Valve: The tricuspid valve  is normal in structure. Tricuspid valve regurgitation is mild. Aortic Valve: The aortic valve is tricuspid. Aortic valve regurgitation is not visualized. Pulmonic Valve: The pulmonic valve was normal in structure. Pulmonic valve regurgitation is not visualized. Aorta: The aortic  root is normal in size and structure and aortic dilatation noted. There is mild dilatation of the ascending aorta, measuring 40 mm. Venous: The inferior vena cava is normal in size with greater than 50% respiratory variability, suggesting right atrial pressure of 3 mmHg. IAS/Shunts: No atrial level shunt detected by color flow Doppler.  LEFT VENTRICLE PLAX 2D LVIDd:         4.70 cm LVIDs:         3.50 cm LV PW:         1.10 cm LV IVS:        1.10 cm LVOT diam:     2.40 cm LV SV:         71 LV SV Index:   38 LVOT Area:     4.52 cm  LV Volumes (MOD) LV vol d, MOD A2C: 129.0 ml LV vol d, MOD A4C: 117.0 ml LV vol s, MOD A2C: 60.2 ml LV vol s, MOD A4C: 40.4 ml LV SV MOD A2C:     68.8 ml LV SV MOD A4C:     117.0 ml LV SV MOD BP:      75.3 ml RIGHT VENTRICLE             IVC RV S prime:     11.20 cm/s  IVC diam: 2.10 cm TAPSE (M-mode): 1.8 cm LEFT ATRIUM             Index        RIGHT ATRIUM           Index LA diam:        4.40 cm 2.38 cm/m   RA Area:     18.30 cm LA Vol (A2C):   73.0 ml 39.41 ml/m  RA Volume:   46.20 ml  24.94 ml/m LA Vol (A4C):   78.5 ml 42.37 ml/m LA Biplane Vol: 75.6 ml 40.81 ml/m  AORTIC VALVE LVOT Vmax:   119.00 cm/s LVOT Vmean:  73.300 cm/s LVOT VTI:    0.156 m  AORTA Ao Root diam: 3.00 cm Ao Asc diam:  3.95 cm MITRAL VALVE               TRICUSPID VALVE MV Area (PHT): 4.80 cm    TR Peak grad:   25.4 mmHg MV Decel Time: 158 msec    TR Vmax:        252.00 cm/s MV E velocity: 96.90 cm/s                            SHUNTS                            Systemic VTI:  0.16 m                            Systemic Diam: 2.40 cm Ola Berger MD Electronically signed by Ola Berger MD Signature Date/Time: 11/19/2023/4:00:15 PM    Final     CT ABDOMEN PELVIS W CONTRAST Result Date: 11/18/2023 CLINICAL DATA:  RLQ abdominal pain. EXAM: CT ABDOMEN AND PELVIS WITH CONTRAST TECHNIQUE: Multidetector CT imaging of the abdomen and pelvis was performed using the standard protocol following bolus administration of intravenous contrast. RADIATION DOSE REDUCTION: This exam was performed according to the departmental dose-optimization program which includes automated exposure control, adjustment of the mA and/or kV according to patient size and/or  use of iterative reconstruction technique. CONTRAST:  OMNIPAQUE  IOHEXOL  300 MG/ML  SOLN COMPARISON:  None Available. FINDINGS: Lower chest: There are patchy atelectatic changes in the visualized lung bases. No overt consolidation. No pleural effusion. The heart is normal in size. No pericardial effusion. There are coronary artery atherosclerotic calcifications, in keeping with coronary artery disease. Hepatobiliary: The liver is normal in size. Non-cirrhotic configuration. No suspicious mass. These is mild diffuse hepatic steatosis. No intrahepatic or extrahepatic bile duct dilation. No calcified gallstones. Normal gallbladder wall thickness. No pericholecystic inflammatory changes. Pancreas: Unremarkable. No pancreatic ductal dilatation or surrounding inflammatory changes. Spleen: Within normal limits. No focal lesion. Adrenals/Urinary Tract: Adrenal glands are unremarkable. No suspicious renal mass. 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 No hydronephrosis. No renal or ureteric calculi. Unremarkable urinary bladder. Stomach/Bowel: There is a tiny sliding hiatal hernia. No disproportionate dilation of the small or large bowel loops. There is dilated appendix measuring up to 13 mm near the apex. There is small fluid collection surrounding the tip which exhibits partial wall formation (series 4, image 49). There is a small focus of calcification in this region.  Findings may represent small contained micro perforation/periappendiceal abscess. There is fat stranding surrounding the rest of the appendix as well. There is also mild circumferential thickening of the terminal ileum (series 2, image 52) as well as another distal small bowel loop (series 2, image 58) in the vicinity, which are likely secondarily involved. There is also secondary involvement of the cecum. There is fat stranding and trace fluid surrounding the appendix. Rest of the bowel loops are unremarkable. Vascular/Lymphatic: No ascites or pneumoperitoneum. No abdominal or pelvic lymphadenopathy, by size criteria. No aneurysmal dilation of the major abdominal arteries. There are moderate peripheral atherosclerotic vascular calcifications of the aorta and its major branches. Reproductive: Normal size prostate. Symmetric seminal vesicles. Other: There is a small to moderate left inguinal hernia containing fat and portion of unobstructed proximal sigmoid colon. The soft tissues and abdominal wall are otherwise unremarkable. Musculoskeletal: No suspicious osseous lesions. There are moderate multilevel degenerative changes in the visualized spine. IMPRESSION: 1. Findings compatible with acute appendicitis with small contained micro perforation/periappendiceal abscess. There is secondary involvement of the terminal ileum, distal small bowel loop and cecum. No bowel obstruction. 2. Multiple other nonacute observations, as described above. Aortic Atherosclerosis (ICD10-I70.0). Electronically Signed   By: Beula Brunswick M.D.   On: 11/18/2023 11:23      Oralee Billow 11/20/2023  Patient ID: Albino Alu, male   DOB: Feb 03, 1951, 73 y.o.   MRN: [id number]

## 2023-11-21 NOTE — Discharge Summary (Signed)
 Physician Discharge Summary   Patient: Gregory Shields MRN: [id number] DOB: 1951-04-07  Admit date:     11/18/2023  Discharge date: 11/20/2023  Discharge Physician: Charlean Congress  PCP: [facility]  Recommendations at discharge: Follow-up with PCP.  Follow-up with general surgery.  Follow-up with cardiology as recommended.   Follow-up Information     [facility], [place]. Schedule an appointment as soon as possible for a visit in 2 week(s).   Specialty: General Surgery Why: for follow up exam at office. Contact information: [address] [place]   [phone]        [facility]. Schedule an appointment as soon as possible for a visit in 2 week(s).   Specialty: Family Medicine Contact information: [address] [place] [address] [phone]         Nahser, Lela Purple, MD. Schedule an appointment as soon as possible for a visit in 1 month(s).   Specialty: Cardiology Contact information: [address] [place] [address] [phone]                Discharge Diagnoses: Principal Problem:   Acute appendicitis with microperforation and periappendiceal abscess without gangrene or peritonitis Active Problems:   Chronic a-fib (HCC)   CAD (coronary artery disease)   HLD (hyperlipidemia)    Hospital Course: Patient with PMH of CAD, chronic A-fib as well as chronic HFrEF, HLD, HTN presenting to the hospital with complaints of right-sided pain.  Was found to have a appendicitis.  General surgery was consulted.  Recommended medical admission and conservative measures.  Cardiology was consulted for preop clearance.  Assessment and Plan: Acute appendicitis with microperforation. Presents with right lower quadrant pain.  CT abdomen shows evidence of acute appendicitis with small contained microperforation as well as abscess.  There is  involvement of the terminal ileum but so far no bowel obstruction. She denies any nausea or vomiting. Was on IV antibiotic. Surgery recommending to advance the diet.  Patient was able to tolerate oral diet. General surgery and patient was agrees to manage this condition conservatively. Outpatient follow-up recommended  CAD Chronic HFrEF Persistent A-fib. On statin.   Currently rate controlled. EF 45 to 50% with hypokinesis of anterior lateral and apical wall segments. Would recommend to continue 81 mg aspirin , Eliquis , statin. Not on any GDMT medication.   Patient currently not interested in initiating any medication. Outpatient follow-up with cardiology recommended for further discussion.  Left inguinal hernia. No obstruction.  Incidentally seen on CT scan. Management per surgery.      Consultants:  General surgery Cardiology  Procedures performed:  Echocardiogram   DISCHARGE MEDICATION: Allergies as of 11/20/2023       Reactions   Other Itching, Other (See Comments)   Pollen- Itchy eyes, sneezing, runny nose, etc..   Penicillins Other (See Comments)   Doesn't remember exact allergic reaction        Medication List     TAKE these medications    amoxicillin -clavulanate 875-125 MG tablet Commonly known as: AUGMENTIN  Take 1 tablet by mouth 2 (two) times daily.   aspirin  EC 325 MG tablet Take 162.5 mg by mouth daily as needed (for headaches).   FISH OIL PO Take 1 capsule by mouth daily.   Multivitamin Men Tabs Take 1 tablet by mouth daily with breakfast.   Vitamin B Complex Tabs Take 1 tablet by mouth daily with breakfast.   VITAMIN D3  PO Take 1 capsule by mouth daily.       Disposition: Home Diet recommendation: Cardiac diet  Discharge Exam: Vitals:   11/19/23 1554 11/19/23 2041 11/20/23 0400 11/20/23 0804  BP: 115/63 124/76 125/79 123/70  Pulse: (!) 103 100 91 70  Resp: 16 16 18 18   Temp: 98 F (36.7 C) 99.5 F (37.5 C) 98.4 F (36.9 C)  98.2 F (36.8 C)  TempSrc: Oral Oral Oral Oral  SpO2: 99% 99% 99% 99%  Weight:      Height:       General: Appear in mild distress; no visible Abnormal Neck Mass Or lumps, Conjunctiva normal Cardiovascular: S1 and S2 Present, no Murmur, Respiratory: good respiratory effort, Bilateral Air entry present and CTA, no Crackles, no wheezes Abdomen: Bowel Sound present, Non tender  Extremities: no Pedal edema Neurology: alert and oriented to time, place, and person  [facility] Weights   11/18/23 2203  Weight: 72 kg   Condition at discharge: stable  The results of significant diagnostics from this hospitalization (including imaging, microbiology, ancillary and laboratory) are listed below for reference.   Imaging Studies: ECHOCARDIOGRAM COMPLETE Result Date: 11/19/2023    ECHOCARDIOGRAM REPORT   Patient Name:   Gregory Shields Date of Exam: 11/19/2023 Medical Rec #:  [id number]    Height:       68.0 in Accession #:    [id number]   Weight:       158.7 lb Date of Birth:  September 14, 1950   BSA:          1.853 m Patient Age:    73 years     BP:           124/72 mmHg Patient Gender: M            HR:           86 bpm. Exam Location:  Inpatient Procedure: 2D Echo, Cardiac Doppler, Color Doppler and Intracardiac            Opacification Agent (Both Spectral and Color Flow Doppler were            utilized during procedure). Indications:    I50.40* Unspecified combined systolic (congestive) and diastolic                 (congestive) heart failure  History:        Patient has prior history of Echocardiogram examinations, most                 recent 04/22/2020. Previous Myocardial Infarction, Abnormal ECG,                 Stroke, Arrythmias:Atrial Fibrillation and Atrial Flutter; Risk                 Factors:Hypertension and Sleep Apnea. LV thrombus.  Sonographer:    Raynelle Callow RDCS Referring Phys: [id number] [facility]  Sonographer Comments: Technically difficult study due to poor echo windows. IMPRESSIONS  1. Hypokinesis/akinesis  of the distal anterior, distal inferior and apical wall segments . Left ventricular ejection fraction, by estimation, is 45 to 50%. The left ventricle has mildly decreased function.  2. Right ventricular systolic function is normal. The right ventricular size is normal. There is normal pulmonary artery systolic pressure. The estimated right ventricular systolic pressure is 33.4 mmHg.  3. Left atrial size was moderately dilated.  4. The mitral valve is normal in structure. Mild mitral valve regurgitation.  5. The aortic valve is tricuspid. Aortic valve regurgitation is  not visualized.  6. Aortic dilatation noted. There is mild dilatation of the ascending aorta, measuring 40 mm.  7. The inferior vena cava is normal in size with greater than 50% respiratory variability, suggesting right atrial pressure of 3 mmHg. Comparison(s): The left ventricular function is unchanged. FINDINGS  Left Ventricle: Hypokinesis/akinesis of the distal anterior, distal inferior and apical wall segments. Left ventricular ejection fraction, by estimation, is 45 to 50%. The left ventricle has mildly decreased function. Definity  contrast agent was given IV to delineate the left ventricular endocardial borders. The left ventricular internal cavity size was normal in size. There is no left ventricular hypertrophy. Right Ventricle: The right ventricular size is normal. Right vetricular wall thickness was not assessed. Right ventricular systolic function is normal. There is normal pulmonary artery systolic pressure. The tricuspid regurgitant velocity is 2.52 m/s, and with an assumed right atrial pressure of 8 mmHg, the estimated right ventricular systolic pressure is 33.4 mmHg. Left Atrium: Left atrial size was moderately dilated. Right Atrium: Right atrial size was normal in size. Pericardium: There is no evidence of pericardial effusion. Mitral Valve: The mitral valve is normal in structure. Mild mitral valve regurgitation. Tricuspid Valve: The  tricuspid valve is normal in structure. Tricuspid valve regurgitation is mild. Aortic Valve: The aortic valve is tricuspid. Aortic valve regurgitation is not visualized. Pulmonic Valve: The pulmonic valve was normal in structure. Pulmonic valve regurgitation is not visualized. Aorta: The aortic root is normal in size and structure and aortic dilatation noted. There is mild dilatation of the ascending aorta, measuring 40 mm. Venous: The inferior vena cava is normal in size with greater than 50% respiratory variability, suggesting right atrial pressure of 3 mmHg. IAS/Shunts: No atrial level shunt detected by color flow Doppler.  LEFT VENTRICLE PLAX 2D LVIDd:         4.70 cm LVIDs:         3.50 cm LV PW:         1.10 cm LV IVS:        1.10 cm LVOT diam:     2.40 cm LV SV:         71 LV SV Index:   38 LVOT Area:     4.52 cm  LV Volumes (MOD) LV vol d, MOD A2C: 129.0 ml LV vol d, MOD A4C: 117.0 ml LV vol s, MOD A2C: 60.2 ml LV vol s, MOD A4C: 40.4 ml LV SV MOD A2C:     68.8 ml LV SV MOD A4C:     117.0 ml LV SV MOD BP:      75.3 ml RIGHT VENTRICLE             IVC RV S prime:     11.20 cm/s  IVC diam: 2.10 cm TAPSE (M-mode): 1.8 cm LEFT ATRIUM             Index        RIGHT ATRIUM           Index LA diam:        4.40 cm 2.38 cm/m   RA Area:     18.30 cm LA Vol (A2C):   73.0 ml 39.41 ml/m  RA Volume:   46.20 ml  24.94 ml/m LA Vol (A4C):   78.5 ml 42.37 ml/m LA Biplane Vol: 75.6 ml 40.81 ml/m  AORTIC VALVE LVOT Vmax:   119.00 cm/s LVOT Vmean:  73.300 cm/s LVOT VTI:    0.156 m  AORTA Ao Root diam: 3.00  cm Ao Asc diam:  3.95 cm MITRAL VALVE               TRICUSPID VALVE MV Area (PHT): 4.80 cm    TR Peak grad:   25.4 mmHg MV Decel Time: 158 msec    TR Vmax:        252.00 cm/s MV E velocity: 96.90 cm/s                            SHUNTS                            Systemic VTI:  0.16 m                            Systemic Diam: 2.40 cm Ola Berger MD Electronically signed by Ola Berger MD Signature Date/Time:  11/19/2023/4:00:15 PM    Final    CT ABDOMEN PELVIS W CONTRAST Result Date: 11/18/2023 CLINICAL DATA:  RLQ abdominal pain. EXAM: CT ABDOMEN AND PELVIS WITH CONTRAST TECHNIQUE: Multidetector CT imaging of the abdomen and pelvis was performed using the standard protocol following bolus administration of intravenous contrast. RADIATION DOSE REDUCTION: This exam was performed according to the departmental dose-optimization program which includes automated exposure control, adjustment of the mA and/or kV according to patient size and/or use of iterative reconstruction technique. CONTRAST:  OMNIPAQUE  IOHEXOL  300 MG/ML  SOLN COMPARISON:  None Available. FINDINGS: Lower chest: There are patchy atelectatic changes in the visualized lung bases. No overt consolidation. No pleural effusion. The heart is normal in size. No pericardial effusion. There are coronary artery atherosclerotic calcifications, in keeping with coronary artery disease. Hepatobiliary: The liver is normal in size. Non-cirrhotic configuration. No suspicious mass. These is mild diffuse hepatic steatosis. No intrahepatic or extrahepatic bile duct dilation. No calcified gallstones. Normal gallbladder wall thickness. No pericholecystic inflammatory changes. Pancreas: Unremarkable. No pancreatic ductal dilatation or surrounding inflammatory changes. Spleen: Within normal limits. No focal lesion. Adrenals/Urinary Tract: Adrenal glands are unremarkable. No suspicious renal mass. 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 No hydronephrosis. No renal or ureteric calculi. Unremarkable urinary bladder. Stomach/Bowel: There is a tiny sliding hiatal hernia. No disproportionate dilation of the small or large bowel loops. There is dilated appendix measuring up to 13 mm near the apex. There is small fluid collection surrounding the tip which exhibits partial wall formation (series 4, image 49). There is a small focus of  calcification in this region. Findings may represent small contained micro perforation/periappendiceal abscess. There is fat stranding surrounding the rest of the appendix as well. There is also mild circumferential thickening of the terminal ileum (series 2, image 52) as well as another distal small bowel loop (series 2, image 58) in the vicinity, which are likely secondarily involved. There is also secondary involvement of the cecum. There is fat stranding and trace fluid surrounding the appendix. Rest of the bowel loops are unremarkable. Vascular/Lymphatic: No ascites or pneumoperitoneum. No abdominal or pelvic lymphadenopathy, by size criteria. No aneurysmal dilation of the major abdominal arteries. There are moderate peripheral atherosclerotic vascular calcifications of the aorta and its major branches. Reproductive: Normal size prostate. Symmetric seminal vesicles. Other: There is a small to moderate left inguinal hernia containing fat and portion of unobstructed proximal sigmoid colon. The soft tissues and abdominal wall are otherwise unremarkable. Musculoskeletal: No  suspicious osseous lesions. There are moderate multilevel degenerative changes in the visualized spine. IMPRESSION: 1. Findings compatible with acute appendicitis with small contained micro perforation/periappendiceal abscess. There is secondary involvement of the terminal ileum, distal small bowel loop and cecum. No bowel obstruction. 2. Multiple other nonacute observations, as described above. Aortic Atherosclerosis (ICD10-I70.0). Electronically Signed   By: Beula Brunswick M.D.   On: 11/18/2023 11:23    Microbiology: Results for orders placed or performed during the hospital encounter of 11/18/23  Surgical PCR screen     Status: None   Collection Time: 11/18/23  9:24 PM   Specimen: Nasal Mucosa; Nasal Swab  Result Value Ref Range Status   MRSA, PCR NEGATIVE NEGATIVE Final   Staphylococcus aureus NEGATIVE NEGATIVE Final    Comment:  (NOTE) The Xpert SA Assay (FDA approved for NASAL specimens in patients 72 years of age and older), is one component of a comprehensive surveillance program. It is not intended to diagnose infection nor to guide or monitor treatment. Performed at [facility], [address] [place], [place] [address]    Labs: CBC: Recent Labs  Lab 11/18/23 0526 11/20/23 0557  WBC 11.1* 7.6  NEUTROABS 9.0* 5.5  HGB 14.9 13.5  HCT 42.2 38.1*  MCV 91.1 89.0  PLT 235 221   Basic Metabolic Panel: Recent Labs  Lab 11/18/23 0526 11/20/23 0557  NA 133* 134*  K 3.9 3.5  CL 99 102  CO2 23 22  GLUCOSE 114* 100*  BUN 11 12  CREATININE 0.97 0.94  CALCIUM  8.8* 8.3*   Liver Function Tests: Recent Labs  Lab 11/18/23 0526  AST 17  ALT 13  ALKPHOS 57  BILITOT 2.3*  PROT 6.4*  ALBUMIN 3.3*   CBG: No results for input(s): "GLUCAP" in the last 168 hours.  Discharge time spent: greater than 30 minutes.  Author: Charlean Congress, MD  [facility] 11/20/2023

## 2024-05-02 ENCOUNTER — Emergency Department (HOSPITAL_COMMUNITY)

## 2024-05-02 ENCOUNTER — Inpatient Hospital Stay (HOSPITAL_COMMUNITY): Admitting: Anesthesiology

## 2024-05-02 ENCOUNTER — Inpatient Hospital Stay (HOSPITAL_COMMUNITY)

## 2024-05-02 ENCOUNTER — Encounter (HOSPITAL_COMMUNITY): Admission: EM | Disposition: E | Payer: Self-pay | Source: Home / Self Care | Attending: Neurology

## 2024-05-02 ENCOUNTER — Inpatient Hospital Stay (HOSPITAL_COMMUNITY): Admission: EM | Admit: 2024-05-02 | Discharge: 2024-06-03 | DRG: 023 | Disposition: E

## 2024-05-02 DIAGNOSIS — Z66 Do not resuscitate: Secondary | ICD-10-CM | POA: Diagnosis present

## 2024-05-02 DIAGNOSIS — I63543 Cerebral infarction due to unspecified occlusion or stenosis of bilateral cerebellar arteries: Secondary | ICD-10-CM | POA: Diagnosis not present

## 2024-05-02 DIAGNOSIS — R569 Unspecified convulsions: Secondary | ICD-10-CM

## 2024-05-02 DIAGNOSIS — F1210 Cannabis abuse, uncomplicated: Secondary | ICD-10-CM | POA: Diagnosis present

## 2024-05-02 DIAGNOSIS — Z6825 Body mass index (BMI) 25.0-25.9, adult: Secondary | ICD-10-CM

## 2024-05-02 DIAGNOSIS — Z91148 Patient's other noncompliance with medication regimen for other reason: Secondary | ICD-10-CM | POA: Diagnosis not present

## 2024-05-02 DIAGNOSIS — G4089 Other seizures: Secondary | ICD-10-CM | POA: Diagnosis present

## 2024-05-02 DIAGNOSIS — T45516A Underdosing of anticoagulants, initial encounter: Secondary | ICD-10-CM | POA: Diagnosis not present

## 2024-05-02 DIAGNOSIS — I6389 Other cerebral infarction: Secondary | ICD-10-CM | POA: Diagnosis not present

## 2024-05-02 DIAGNOSIS — I2510 Atherosclerotic heart disease of native coronary artery without angina pectoris: Secondary | ICD-10-CM | POA: Diagnosis present

## 2024-05-02 DIAGNOSIS — Z79899 Other long term (current) drug therapy: Secondary | ICD-10-CM | POA: Diagnosis not present

## 2024-05-02 DIAGNOSIS — I480 Paroxysmal atrial fibrillation: Secondary | ICD-10-CM | POA: Diagnosis present

## 2024-05-02 DIAGNOSIS — D696 Thrombocytopenia, unspecified: Secondary | ICD-10-CM | POA: Diagnosis not present

## 2024-05-02 DIAGNOSIS — I6302 Cerebral infarction due to thrombosis of basilar artery: Secondary | ICD-10-CM | POA: Diagnosis present

## 2024-05-02 DIAGNOSIS — I651 Occlusion and stenosis of basilar artery: Secondary | ICD-10-CM | POA: Diagnosis not present

## 2024-05-02 DIAGNOSIS — I482 Chronic atrial fibrillation, unspecified: Secondary | ICD-10-CM | POA: Diagnosis present

## 2024-05-02 DIAGNOSIS — E440 Moderate protein-calorie malnutrition: Secondary | ICD-10-CM | POA: Diagnosis present

## 2024-05-02 DIAGNOSIS — I110 Hypertensive heart disease with heart failure: Secondary | ICD-10-CM | POA: Diagnosis present

## 2024-05-02 DIAGNOSIS — D6959 Other secondary thrombocytopenia: Secondary | ICD-10-CM | POA: Diagnosis not present

## 2024-05-02 DIAGNOSIS — I10 Essential (primary) hypertension: Secondary | ICD-10-CM | POA: Insufficient documentation

## 2024-05-02 DIAGNOSIS — R29728 NIHSS score 28: Secondary | ICD-10-CM | POA: Diagnosis present

## 2024-05-02 DIAGNOSIS — R29730 NIHSS score 30: Secondary | ICD-10-CM | POA: Diagnosis not present

## 2024-05-02 DIAGNOSIS — Z91A48 Caregiver's other noncompliance with patient's medication regimen for other reason: Secondary | ICD-10-CM

## 2024-05-02 DIAGNOSIS — B961 Klebsiella pneumoniae [K. pneumoniae] as the cause of diseases classified elsewhere: Secondary | ICD-10-CM | POA: Diagnosis present

## 2024-05-02 DIAGNOSIS — Z91141 Patient's other noncompliance with medication regimen due to financial hardship: Secondary | ICD-10-CM | POA: Diagnosis not present

## 2024-05-02 DIAGNOSIS — E785 Hyperlipidemia, unspecified: Secondary | ICD-10-CM | POA: Diagnosis present

## 2024-05-02 DIAGNOSIS — Z7901 Long term (current) use of anticoagulants: Secondary | ICD-10-CM | POA: Diagnosis not present

## 2024-05-02 DIAGNOSIS — G4733 Obstructive sleep apnea (adult) (pediatric): Secondary | ICD-10-CM | POA: Diagnosis not present

## 2024-05-02 DIAGNOSIS — I6622 Occlusion and stenosis of left posterior cerebral artery: Secondary | ICD-10-CM | POA: Diagnosis present

## 2024-05-02 DIAGNOSIS — R4182 Altered mental status, unspecified: Secondary | ICD-10-CM | POA: Diagnosis present

## 2024-05-02 DIAGNOSIS — Z880 Allergy status to penicillin: Secondary | ICD-10-CM

## 2024-05-02 DIAGNOSIS — G8250 Quadriplegia, unspecified: Secondary | ICD-10-CM | POA: Diagnosis present

## 2024-05-02 DIAGNOSIS — I639 Cerebral infarction, unspecified: Secondary | ICD-10-CM | POA: Diagnosis present

## 2024-05-02 DIAGNOSIS — J9601 Acute respiratory failure with hypoxia: Secondary | ICD-10-CM | POA: Diagnosis present

## 2024-05-02 DIAGNOSIS — R1310 Dysphagia, unspecified: Secondary | ICD-10-CM | POA: Diagnosis present

## 2024-05-02 DIAGNOSIS — I6322 Cerebral infarction due to unspecified occlusion or stenosis of basilar arteries: Secondary | ICD-10-CM

## 2024-05-02 DIAGNOSIS — J690 Pneumonitis due to inhalation of food and vomit: Secondary | ICD-10-CM | POA: Insufficient documentation

## 2024-05-02 DIAGNOSIS — R29810 Facial weakness: Secondary | ICD-10-CM | POA: Diagnosis present

## 2024-05-02 DIAGNOSIS — I4891 Unspecified atrial fibrillation: Secondary | ICD-10-CM

## 2024-05-02 DIAGNOSIS — I509 Heart failure, unspecified: Secondary | ICD-10-CM

## 2024-05-02 DIAGNOSIS — Z515 Encounter for palliative care: Secondary | ICD-10-CM

## 2024-05-02 DIAGNOSIS — I613 Nontraumatic intracerebral hemorrhage in brain stem: Secondary | ICD-10-CM | POA: Diagnosis present

## 2024-05-02 DIAGNOSIS — I429 Cardiomyopathy, unspecified: Secondary | ICD-10-CM | POA: Diagnosis present

## 2024-05-02 DIAGNOSIS — I5022 Chronic systolic (congestive) heart failure: Secondary | ICD-10-CM | POA: Diagnosis present

## 2024-05-02 DIAGNOSIS — I7090 Unspecified atherosclerosis: Secondary | ICD-10-CM | POA: Diagnosis not present

## 2024-05-02 DIAGNOSIS — H5500 Unspecified nystagmus: Secondary | ICD-10-CM | POA: Diagnosis present

## 2024-05-02 DIAGNOSIS — R29734 NIHSS score 34: Secondary | ICD-10-CM | POA: Diagnosis not present

## 2024-05-02 DIAGNOSIS — I6329 Cerebral infarction due to unspecified occlusion or stenosis of other precerebral arteries: Secondary | ICD-10-CM | POA: Diagnosis not present

## 2024-05-02 DIAGNOSIS — Z7982 Long term (current) use of aspirin: Secondary | ICD-10-CM

## 2024-05-02 DIAGNOSIS — J9600 Acute respiratory failure, unspecified whether with hypoxia or hypercapnia: Secondary | ICD-10-CM

## 2024-05-02 DIAGNOSIS — Z7189 Other specified counseling: Secondary | ICD-10-CM | POA: Diagnosis not present

## 2024-05-02 DIAGNOSIS — R112 Nausea with vomiting, unspecified: Secondary | ICD-10-CM | POA: Diagnosis not present

## 2024-05-02 DIAGNOSIS — I6501 Occlusion and stenosis of right vertebral artery: Secondary | ICD-10-CM | POA: Diagnosis not present

## 2024-05-02 DIAGNOSIS — Z8249 Family history of ischemic heart disease and other diseases of the circulatory system: Secondary | ICD-10-CM

## 2024-05-02 DIAGNOSIS — R29721 NIHSS score 21: Secondary | ICD-10-CM | POA: Diagnosis not present

## 2024-05-02 DIAGNOSIS — I69391 Dysphagia following cerebral infarction: Secondary | ICD-10-CM | POA: Diagnosis not present

## 2024-05-02 DIAGNOSIS — R29723 NIHSS score 23: Secondary | ICD-10-CM | POA: Diagnosis not present

## 2024-05-02 DIAGNOSIS — I252 Old myocardial infarction: Secondary | ICD-10-CM

## 2024-05-02 DIAGNOSIS — D72829 Elevated white blood cell count, unspecified: Secondary | ICD-10-CM | POA: Insufficient documentation

## 2024-05-02 HISTORY — PX: IR US GUIDE VASC ACCESS RIGHT: IMG2390

## 2024-05-02 HISTORY — PX: RADIOLOGY WITH ANESTHESIA: SHX6223

## 2024-05-02 HISTORY — PX: IR PERCUTANEOUS ART THROMBECTOMY/INFUSION INTRACRANIAL INC DIAG ANGIO: IMG6087

## 2024-05-02 LAB — CBC WITH DIFFERENTIAL/PLATELET
Abs Immature Granulocytes: 0.06 10*3/uL (ref 0.00–0.07)
Basophils Absolute: 0 10*3/uL (ref 0.0–0.1)
Basophils Relative: 0 %
Eosinophils Absolute: 0 10*3/uL (ref 0.0–0.5)
Eosinophils Relative: 0 %
HCT: 46 % (ref 39.0–52.0)
Hemoglobin: 16.2 g/dL (ref 13.0–17.0)
Immature Granulocytes: 1 %
Lymphocytes Relative: 8 %
Lymphs Abs: 1 10*3/uL (ref 0.7–4.0)
MCH: 32.5 pg (ref 26.0–34.0)
MCHC: 35.2 g/dL (ref 30.0–36.0)
MCV: 92.2 fL (ref 80.0–100.0)
Monocytes Absolute: 0.5 10*3/uL (ref 0.1–1.0)
Monocytes Relative: 4 %
Neutro Abs: 10.5 10*3/uL — ABNORMAL HIGH (ref 1.7–7.7)
Neutrophils Relative %: 87 %
Platelets: 197 10*3/uL (ref 150–400)
RBC: 4.99 MIL/uL (ref 4.22–5.81)
RDW: 12.3 % (ref 11.5–15.5)
WBC: 12.2 10*3/uL — ABNORMAL HIGH (ref 4.0–10.5)
nRBC: 0 % (ref 0.0–0.2)

## 2024-05-02 LAB — COMPREHENSIVE METABOLIC PANEL WITH GFR
ALT: 11 U/L (ref 0–44)
AST: 23 U/L (ref 15–41)
Albumin: 3.9 g/dL (ref 3.5–5.0)
Alkaline Phosphatase: 57 U/L (ref 38–126)
Anion gap: 12 (ref 5–15)
BUN: 17 mg/dL (ref 8–23)
CO2: 20 mmol/L — ABNORMAL LOW (ref 22–32)
Calcium: 8.6 mg/dL — ABNORMAL LOW (ref 8.9–10.3)
Chloride: 103 mmol/L (ref 98–111)
Creatinine, Ser: 1.03 mg/dL (ref 0.61–1.24)
GFR, Estimated: >60 mL/min (ref >60)
Glucose, Bld: 123 mg/dL — ABNORMAL HIGH (ref 70–99)
Potassium: 4.2 mmol/L (ref 3.5–5.1)
Sodium: 135 mmol/L (ref 135–145)
Total Bilirubin: 1.3 mg/dL — ABNORMAL HIGH (ref 0.0–1.2)
Total Protein: 6.5 g/dL (ref 6.5–8.1)

## 2024-05-02 LAB — URINALYSIS, ROUTINE W REFLEX MICROSCOPIC
Bacteria, UA: NONE SEEN
Bilirubin Urine: NEGATIVE
Glucose, UA: NEGATIVE mg/dL
Ketones, ur: 5 mg/dL — AB
Leukocytes,Ua: NEGATIVE
Nitrite: NEGATIVE
Protein, ur: NEGATIVE mg/dL
Specific Gravity, Urine: 1.014 (ref 1.005–1.030)
pH: 6 (ref 5.0–8.0)

## 2024-05-02 LAB — HEMOGLOBIN A1C
Hgb A1c MFr Bld: 4.5 % — ABNORMAL LOW (ref 4.8–5.6)
Mean Plasma Glucose: 82.45 mg/dL

## 2024-05-02 LAB — I-STAT CHEM 8, ED
BUN: 19 mg/dL (ref 8–23)
Calcium, Ion: 1.1 mmol/L — ABNORMAL LOW (ref 1.15–1.40)
Chloride: 102 mmol/L (ref 98–111)
Creatinine, Ser: 1 mg/dL (ref 0.61–1.24)
Glucose, Bld: 123 mg/dL — ABNORMAL HIGH (ref 70–99)
HCT: 46 % (ref 39.0–52.0)
Hemoglobin: 15.6 g/dL (ref 13.0–17.0)
Potassium: 4.1 mmol/L (ref 3.5–5.1)
Sodium: 137 mmol/L (ref 135–145)
TCO2: 24 mmol/L (ref 22–32)

## 2024-05-02 LAB — CBC
HCT: 38.3 % — ABNORMAL LOW (ref 39.0–52.0)
Hemoglobin: 13.4 g/dL (ref 13.0–17.0)
MCH: 32.6 pg (ref 26.0–34.0)
MCHC: 35 g/dL (ref 30.0–36.0)
MCV: 93.2 fL (ref 80.0–100.0)
Platelets: 151 K/uL (ref 150–400)
RBC: 4.11 MIL/uL — ABNORMAL LOW (ref 4.22–5.81)
RDW: 12.4 % (ref 11.5–15.5)
WBC: 9.4 K/uL (ref 4.0–10.5)
nRBC: 0 % (ref 0.0–0.2)

## 2024-05-02 LAB — TROPONIN I (HIGH SENSITIVITY)
Troponin I (High Sensitivity): 22 ng/L — ABNORMAL HIGH (ref <18)
Troponin I (High Sensitivity): 33 ng/L — ABNORMAL HIGH (ref <18)

## 2024-05-02 LAB — I-STAT ARTERIAL BLOOD GAS, ED
Acid-base deficit: 2 mmol/L (ref 0.0–2.0)
Bicarbonate: 23.9 mmol/L (ref 20.0–28.0)
Calcium, Ion: 1.19 mmol/L (ref 1.15–1.40)
HCT: 42 % (ref 39.0–52.0)
Hemoglobin: 14.3 g/dL (ref 13.0–17.0)
O2 Saturation: 100 %
Patient temperature: 98.6
Potassium: 3.8 mmol/L (ref 3.5–5.1)
Sodium: 135 mmol/L (ref 135–145)
TCO2: 25 mmol/L (ref 22–32)
pCO2 arterial: 43.3 mmHg (ref 32–48)
pH, Arterial: 7.35 (ref 7.35–7.45)
pO2, Arterial: 531 mmHg — ABNORMAL HIGH (ref 83–108)

## 2024-05-02 LAB — RAPID URINE DRUG SCREEN, HOSP PERFORMED
Amphetamines: NOT DETECTED
Barbiturates: NOT DETECTED
Benzodiazepines: POSITIVE — AB
Cocaine: NOT DETECTED
Opiates: NOT DETECTED
Tetrahydrocannabinol: POSITIVE — AB

## 2024-05-02 LAB — CREATININE, SERUM
Creatinine, Ser: 0.87 mg/dL (ref 0.61–1.24)
GFR, Estimated: >60 mL/min (ref >60)

## 2024-05-02 LAB — GLUCOSE, CAPILLARY: Glucose-Capillary: 105 mg/dL — ABNORMAL HIGH (ref 70–99)

## 2024-05-02 LAB — HIV ANTIBODY (ROUTINE TESTING W REFLEX): HIV Screen 4th Generation wRfx: NONREACTIVE

## 2024-05-02 LAB — RESP PANEL BY RT-PCR (RSV, FLU A&B, COVID)  RVPGX2
Influenza A by PCR: NEGATIVE
Influenza B by PCR: NEGATIVE
Resp Syncytial Virus by PCR: NEGATIVE
SARS Coronavirus 2 by RT PCR: NEGATIVE

## 2024-05-02 LAB — CBG MONITORING, ED: Glucose-Capillary: 112 mg/dL — ABNORMAL HIGH (ref 70–99)

## 2024-05-02 LAB — MAGNESIUM: Magnesium: 1.9 mg/dL (ref 1.7–2.4)

## 2024-05-02 LAB — I-STAT CG4 LACTIC ACID, ED: Lactic Acid, Venous: 1.9 mmol/L (ref 0.5–1.9)

## 2024-05-02 SURGERY — RADIOLOGY WITH ANESTHESIA
Anesthesia: General

## 2024-05-02 MED ORDER — SODIUM CHLORIDE 0.9 % IV BOLUS
250.0000 mL | INTRAVENOUS | Status: AC | PRN
  Administered 2024-05-02: 250 mL via INTRAVENOUS

## 2024-05-02 MED ORDER — FAMOTIDINE 20 MG PO TABS
20.0000 mg | ORAL_TABLET | Freq: Two times a day (BID) | ORAL | Status: DC
  Administered 2024-05-03 – 2024-05-10 (×14): 20 mg
  Filled 2024-05-02 (×15): qty 1

## 2024-05-02 MED ORDER — FENTANYL CITRATE (PF) 100 MCG/2ML IJ SOLN
INTRAMUSCULAR | Status: AC
  Filled 2024-05-02: qty 2

## 2024-05-02 MED ORDER — LACTATED RINGERS IV SOLN: INTRAVENOUS | Status: DC | PRN

## 2024-05-02 MED ORDER — ORAL CARE MOUTH RINSE: 15.0000 mL | OROMUCOSAL | Status: DC | PRN

## 2024-05-02 MED ORDER — PHENYLEPHRINE 80 MCG/ML (10ML) SYRINGE FOR IV PUSH (FOR BLOOD PRESSURE SUPPORT)
PREFILLED_SYRINGE | INTRAVENOUS | Status: DC | PRN
  Administered 2024-05-02 (×2): 80 ug via INTRAVENOUS
  Administered 2024-05-02 (×2): 160 ug via INTRAVENOUS
  Administered 2024-05-02: 80 ug via INTRAVENOUS

## 2024-05-02 MED ORDER — ACETAMINOPHEN 160 MG/5ML PO SOLN
650.0000 mg | ORAL | Status: DC | PRN
  Administered 2024-05-03 – 2024-05-14 (×11): 650 mg
  Filled 2024-05-02 (×12): qty 20.3

## 2024-05-02 MED ORDER — ACETAMINOPHEN 325 MG PO TABS: 650.0000 mg | ORAL_TABLET | ORAL | Status: DC | PRN

## 2024-05-02 MED ORDER — SODIUM CHLORIDE 0.9 % IV SOLN: INTRAVENOUS | Status: AC

## 2024-05-02 MED ORDER — MIDAZOLAM HCL 2 MG/2ML IJ SOLN
2.0000 mg | Freq: Once | INTRAMUSCULAR | Status: DC
Start: 2024-05-02 — End: 2024-05-02

## 2024-05-02 MED ORDER — FENTANYL BOLUS VIA INFUSION
25.0000 ug | INTRAVENOUS | Status: DC | PRN
  Administered 2024-05-04: 100 ug via INTRAVENOUS
  Administered 2024-05-05: 50 ug via INTRAVENOUS

## 2024-05-02 MED ORDER — ORAL CARE MOUTH RINSE
15.0000 mL | OROMUCOSAL | Status: DC
  Administered 2024-05-02 – 2024-05-08 (×66): 15 mL via OROMUCOSAL

## 2024-05-02 MED ORDER — ENOXAPARIN SODIUM 40 MG/0.4ML IJ SOSY
40.0000 mg | PREFILLED_SYRINGE | INTRAMUSCULAR | Status: DC
  Administered 2024-05-03 – 2024-05-04 (×2): 40 mg via SUBCUTANEOUS
  Filled 2024-05-02 (×2): qty 0.4

## 2024-05-02 MED ORDER — ACETAMINOPHEN 650 MG RE SUPP: 650.0000 mg | RECTAL | Status: DC | PRN

## 2024-05-02 MED ORDER — ETOMIDATE 2 MG/ML IV SOLN
INTRAVENOUS | Status: AC | PRN
  Administered 2024-05-02: 20 mg via INTRAVENOUS

## 2024-05-02 MED ORDER — FENTANYL CITRATE PF 50 MCG/ML IJ SOSY: 100.0000 ug | PREFILLED_SYRINGE | Freq: Once | INTRAMUSCULAR | Status: DC

## 2024-05-02 MED ORDER — EPHEDRINE SULFATE-NACL 50-0.9 MG/10ML-% IV SOSY
PREFILLED_SYRINGE | INTRAVENOUS | Status: DC | PRN
  Administered 2024-05-02: 10 mg via INTRAVENOUS

## 2024-05-02 MED ORDER — LORAZEPAM 2 MG/ML IJ SOLN
2.0000 mg | Freq: Once | INTRAMUSCULAR | Status: AC
  Administered 2024-05-02: 2 mg via INTRAVENOUS
  Filled 2024-05-02: qty 1

## 2024-05-02 MED ORDER — IOHEXOL 300 MG/ML  SOLN
150.0000 mL | Freq: Once | INTRAMUSCULAR | Status: AC | PRN
  Administered 2024-05-02: 70 mL via INTRA_ARTERIAL

## 2024-05-02 MED ORDER — PROPOFOL 1000 MG/100ML IV EMUL
0.0000 ug/kg/min | INTRAVENOUS | Status: DC
  Administered 2024-05-02: 5 ug/kg/min via INTRAVENOUS
  Administered 2024-05-02: 60 ug/kg/min via INTRAVENOUS
  Administered 2024-05-03: 40 ug/kg/min via INTRAVENOUS
  Administered 2024-05-03: 30 ug/kg/min via INTRAVENOUS
  Administered 2024-05-03: 10 ug/kg/min via INTRAVENOUS
  Administered 2024-05-04: 30 ug/kg/min via INTRAVENOUS
  Administered 2024-05-04: 10 ug/kg/min via INTRAVENOUS
  Administered 2024-05-05: 20 ug/kg/min via INTRAVENOUS
  Filled 2024-05-02 (×2): qty 100
  Filled 2024-05-02: qty 200
  Filled 2024-05-02 (×2): qty 100

## 2024-05-02 MED ORDER — STROKE: EARLY STAGES OF RECOVERY BOOK
Freq: Once | Status: AC
  Filled 2024-05-02: qty 1

## 2024-05-02 MED ORDER — LEVETIRACETAM (KEPPRA) 500 MG/5 ML ADULT IV PUSH
4250.0000 mg | Freq: Once | INTRAVENOUS | Status: AC
  Administered 2024-05-02: 4250 mg via INTRAVENOUS

## 2024-05-02 MED ORDER — SUCCINYLCHOLINE CHLORIDE 20 MG/ML IJ SOLN
INTRAMUSCULAR | Status: AC | PRN
  Administered 2024-05-02: 120 mg via INTRAVENOUS

## 2024-05-02 MED ORDER — FENTANYL CITRATE (PF) 100 MCG/2ML IJ SOLN
INTRAMUSCULAR | Status: AC | PRN
  Administered 2024-05-02 (×2): 100 ug via INTRAVENOUS

## 2024-05-02 MED ORDER — FENTANYL 2500MCG IN NS 250ML (10MCG/ML) PREMIX INFUSION
0.0000 ug/h | INTRAVENOUS | Status: DC
  Administered 2024-05-02 – 2024-05-05 (×2): 50 ug/h via INTRAVENOUS
  Filled 2024-05-02: qty 250

## 2024-05-02 MED ORDER — CLEVIDIPINE BUTYRATE 0.5 MG/ML IV EMUL: 0.0000 mg/h | INTRAVENOUS | Status: DC

## 2024-05-02 MED ORDER — ROCURONIUM BROMIDE 10 MG/ML (PF) SYRINGE
PREFILLED_SYRINGE | INTRAVENOUS | Status: DC | PRN
  Administered 2024-05-02: 30 mg via INTRAVENOUS
  Administered 2024-05-02: 50 mg via INTRAVENOUS

## 2024-05-02 MED ORDER — IOHEXOL 350 MG/ML SOLN
100.0000 mL | Freq: Once | INTRAVENOUS | Status: AC | PRN
  Administered 2024-05-02: 100 mL via INTRAVENOUS

## 2024-05-02 MED ORDER — FAMOTIDINE 20 MG PO TABS: 20.0000 mg | ORAL_TABLET | Freq: Two times a day (BID) | ORAL | Status: DC

## 2024-05-02 MED ORDER — SENNOSIDES-DOCUSATE SODIUM 8.6-50 MG PO TABS
1.0000 | ORAL_TABLET | Freq: Every evening | ORAL | Status: DC | PRN
  Administered 2024-05-04: 1 via ORAL
  Filled 2024-05-02: qty 1

## 2024-05-02 MED ORDER — MANNITOL 20 % IV SOLN
100.0000 g | Freq: Once | INTRAVENOUS | Status: AC
  Administered 2024-05-02: 100 g via INTRAVENOUS
  Filled 2024-05-02: qty 500

## 2024-05-02 MED ORDER — CHLORHEXIDINE GLUCONATE CLOTH 2 % EX PADS
6.0000 | MEDICATED_PAD | Freq: Every day | CUTANEOUS | Status: DC
Start: 2024-05-03 — End: 2024-05-15
  Administered 2024-05-02 – 2024-05-12 (×10): 6 via TOPICAL

## 2024-05-02 NOTE — ED Notes (Signed)
 Neurology at bedside.

## 2024-05-02 NOTE — ED Triage Notes (Addendum)
 PT BIB [facility] unresponsive.  PT LKW 1530 by wife. Found in back yard by bystander at 1738 per EMS call.  PT vomited profusely and began seizing per bystander.  EMS witnessed seizure activity as well. EMS gave 10 mg Versed  IM, and 4mg  Zofran  IV.  148/96 100% NRB, RR 14 ETC02 30 CBG 116   18G in BL AC's

## 2024-05-02 NOTE — ED Notes (Signed)
 Transported to IR

## 2024-05-02 NOTE — ED Notes (Signed)
 Pt belongings packed and given to family. Confirmed with family that all belongings were accounted for.

## 2024-05-02 NOTE — Progress Notes (Addendum)
 eLink Physician-Brief Progress Note Patient Name: Gregory Shields DOB: 03-13-1951 MRN: [id number]   Date of Service  05/02/2024  HPI/Events of Note  73 year old with a history of CVA, A-fib, coronary artery disease, sleep apnea and hypertension who presents with new basilar artery stroke status post thrombectomy who returns to the ICU intubated, sedated  Vital signs are within normal limits.  Patient is mechanically ventilated.  Results are consistent with adequate oxygenation and ventilation.  UDS positive for benzos and THC.  Interval CT scans reviewed and stable.  eICU Interventions  Maintain mechanical ventilation, daily spontaneous awakening/breathing trials  Status post mannitol, check osmolarity's, monitor for neurochanges.  Tight blood pressure control, SBP goal 120- 160  DVT prophylaxis with Lovenox GI prophylaxis with famotidine   0052 -limited response to the fluid bolus, initiate phenylephrine to maintain SBP goal 120-160  Intervention Category Evaluation Type: New Patient Evaluation  Dola Lunsford 05/02/2024, 11:57 PM

## 2024-05-02 NOTE — ED Provider Notes (Signed)
 [place] EMERGENCY DEPARTMENT AT [facility] Provider Note   CSN: [id number] Arrival date & time: 05/02/24  8158     Patient presents with: No chief complaint on file.   Gregory Shields is a 73 y.o. male.   Pt is a 73 yo male with pmhx significant for afib (eliquis ?) CAD, sleep apnea, and HTN.  Pt was last seen by his wife at 1530.  He was found in his back yard by a bystander and was noted to be seizing.  EMS said it took them about 20 minutes to arrive and he was still seizing when they arrived.  He vomited on himself, so EMS also gave him 4 mg of Zofran .  He was no longer seizing when he arrived, but is unresponsive.  He is unable to give any hx.        Prior to Admission medications   Medication Sig Start Date End Date Taking? Authorizing Provider  aspirin  EC 325 MG tablet Take 162.5 mg by mouth daily as needed (for headaches).   Yes [provider]  B Complex Vitamins (VITAMIN B COMPLEX) TABS Take 1 tablet by mouth daily with breakfast.   Yes [provider]  Cholecalciferol (VITAMIN D3 PO) Take 1 capsule by mouth daily.   Yes [provider]  clobetasol cream (TEMOVATE) 0.05 % Apply 1 Application topically 2 (two) times daily. 04/19/24  Yes [provider]  Multiple Vitamins-Minerals (MULTIVITAMIN MEN) TABS Take 1 tablet by mouth daily with breakfast.   Yes [provider]  Omega-3 Fatty Acids (FISH OIL PO) Take 1 capsule by mouth daily.   Yes [provider]    Allergies: Other and Penicillins    Review of Systems  Unable to perform ROS: Patient unresponsive  All other systems reviewed and are negative.   Updated Vital Signs BP (!) 138/92   Pulse (!) 57   Temp (!) 97.3 F (36.3 C)   Resp 18   SpO2 100%   Physical Exam Vitals and nursing note reviewed.  Constitutional:      General: He is in acute distress.     Appearance: He is ill-appearing and toxic-appearing.  HENT:     Head: Normocephalic and  atraumatic.     Right Ear: External ear normal.     Left Ear: External ear normal.     Nose: Nose normal.     Mouth/Throat:     Mouth: Mucous membranes are dry.  Eyes:     Extraocular Movements: Extraocular movements intact.     Conjunctiva/sclera: Conjunctivae normal.     Pupils: Pupils are equal, round, and reactive to light.  Neck:     Comments: In c-collar Cardiovascular:     Rate and Rhythm: Tachycardia present. Rhythm irregular.     Pulses: Normal pulses.     Heart sounds: Normal heart sounds.  Pulmonary:     Comments: Gurgling breath sounds Abdominal:     General: Abdomen is flat. Bowel sounds are normal.     Palpations: Abdomen is soft.  Musculoskeletal:        General: Normal range of motion.  Skin:    General: Skin is warm.     Capillary Refill: Capillary refill takes less than 2 seconds.  Neurological:     Mental Status: He is unresponsive.  Psychiatric:     Comments: Unable to assess     (all labs ordered are listed, but only abnormal results are displayed) Labs Reviewed  CBC WITH DIFFERENTIAL/PLATELET -  Abnormal; Notable for the following components:      Result Value   WBC 12.2 (*)    Neutro Abs 10.5 (*)    All other components within normal limits  COMPREHENSIVE METABOLIC PANEL WITH GFR - Abnormal; Notable for the following components:   CO2 20 (*)    Glucose, Bld 123 (*)    Calcium  8.6 (*)    Total Bilirubin 1.3 (*)    All other components within normal limits  URINALYSIS, ROUTINE W REFLEX MICROSCOPIC - Abnormal; Notable for the following components:   Hgb urine dipstick SMALL (*)    Ketones, ur 5 (*)    All other components within normal limits  RAPID URINE DRUG SCREEN, HOSP PERFORMED - Abnormal; Notable for the following components:   Benzodiazepines POSITIVE (*)    Tetrahydrocannabinol POSITIVE (*)    All other components within normal limits  CBC - Abnormal; Notable for the following components:   RBC 4.11 (*)    HCT 38.3 (*)    All other  components within normal limits  CBG MONITORING, ED - Abnormal; Notable for the following components:   Glucose-Capillary 112 (*)    All other components within normal limits  I-STAT CHEM 8, ED - Abnormal; Notable for the following components:   Glucose, Bld 123 (*)    Calcium , Ion 1.10 (*)    All other components within normal limits  I-STAT ARTERIAL BLOOD GAS, ED - Abnormal; Notable for the following components:   pO2, Arterial 531 (*)    All other components within normal limits  TROPONIN I (HIGH SENSITIVITY) - Abnormal; Notable for the following components:   Troponin I (High Sensitivity) 22 (*)    All other components within normal limits  RESP PANEL BY RT-PCR (RSV, FLU A&B, COVID)  RVPGX2  MAGNESIUM  ETHANOL  HIV ANTIBODY (ROUTINE TESTING W REFLEX)  LIPID PANEL  HEMOGLOBIN A1C  CREATININE, SERUM  CBG MONITORING, ED  I-STAT CG4 LACTIC ACID, ED  TROPONIN I (HIGH SENSITIVITY)    EKG: EKG Interpretation Date/Time:  Tuesday May 02 2024 18:49:15 EDT Ventricular Rate:  133 PR Interval:    QRS Duration:  91 QT Interval:  312 QTC Calculation: 465 R Axis:   97  Text Interpretation: Atrial fibrillation Anterior infarct, old Since last tracing rate faster Confirmed by Dean Clarity [id number]) on 05/02/2024 7:28:38 PM  Radiology: CT ANGIO HEAD NECK W WO CM W PERF (CODE STROKE) Result Date: 05/02/2024 CLINICAL DATA:  Initial evaluation for acute neuro deficit, stroke. EXAM: CT ANGIOGRAPHY HEAD AND NECK CT PERFUSION BRAIN TECHNIQUE: Multidetector CT imaging of the head and neck was performed using the standard protocol during bolus administration of intravenous contrast. Multiplanar CT image reconstructions and MIPs were obtained to evaluate the vascular anatomy. Carotid stenosis measurements (when applicable) are obtained utilizing NASCET criteria, using the distal internal carotid diameter as the denominator. Multiphase CT imaging of the brain was performed following IV bolus  contrast injection. Subsequent parametric perfusion maps were calculated using RAPID software. RADIATION DOSE REDUCTION: This exam was performed according to the departmental dose-optimization program which includes automated exposure control, adjustment of the mA and/or kV according to patient size and/or use of iterative reconstruction technique. CONTRAST:  OMNIPAQUE  IOHEXOL  350 MG/ML SOLN COMPARISON:  Prior CT from earlier the same day. FINDINGS: CTA NECK FINDINGS Aortic arch: Visualized aortic arch within normal limits for caliber with standard 3 vessel morphology. Aortic atherosclerosis. Right carotid system: Right common and internal carotid arteries are patent without dissection.  Calcified plaque about the right carotid bulb without hemodynamically significant greater than 50% stenosis. Left carotid system: Left common and internal carotid arteries are patent without dissection. Atheromatous change about the left carotid bulb without hemodynamically significant greater than 50% stenosis. Vertebral arteries: Both vertebral arteries arise from subclavian arteries. Atheromatous plaque at the origin of the left vertebral artery with moderate stenosis (series 6, image 293). Left vertebral artery otherwise patent distally without stenosis or dissection. Right vertebral artery occluded at its origin, and remains largely occluded within the neck. Skeleton: No worrisome osseous lesions. Exaggeration of the normal cervical lordosis and visualized thoracic kyphosis. Other neck: No other acute finding. Upper chest: Mild dependent atelectatic changes noted within the visualized lung bases. 8 mm pulmonary nodule present at the left lung apex (series 8, image 155). Review of the MIP images confirms the above findings CTA HEAD FINDINGS Anterior circulation: Atheromatous change about the carotid siphons with no more than mild narrowing on the left, and mild to moderate narrowing at the para clinoid right ICA. A1  segments patent bilaterally. Normal anterior communicating complex. Anterior cerebral arteries patent without significant stenosis. Right M1 segment widely patent. Mild narrowing of the distal left M1 segment. No proximal M2 branch occlusion or high-grade stenosis. Distal MCA branches perfused and symmetric. Posterior circulation: Atheromatous plaque about the mid left V4 segment with short-segment moderate stenosis (series 6, image 167). Left V4 segment becomes increasingly attenuated as it courses cephalad to the vertebrobasilar junction. Left PICA not seen. Right vertebral artery occluded at the skull base and remains occluded to the vertebrobasilar junction. Basilar patent proximally, but occludes just beyond the takeoff of the PICA as, consistent with acute basilar thrombosis (a series 6, image 128). Distal reconstitution at the basilar tip. Left SCA patent at its origin. Right SCA not well visualized, possibly occluded. Left PCA primarily supplied via the basilar, and occludes at the proximal left P2 segment (series 9, image 169). Predominant fetal type origin of the right PCA. Right PCA remains patent, although demonstrates moderate to severe multifocal atheromatous stenoses. Venous sinuses: Grossly patent allowing for timing the contrast bolus. Anatomic variants: As above.  No aneurysm. Review of the MIP images confirms the above findings CT Brain Perfusion Findings: CBF (<30%) Volume: 0mL Perfusion (Tmax>6.0s) volume: Mismatch Volume: Infarction Location:No acute core infarct by CT perfusion. Delayed perfusion seen throughout the posterior circulation, involving the cerebellum and left greater than right occipital regions, in keeping with the basilar thrombosis. IMPRESSION: 1. Positive CTA for acute basilar thrombosis. Distal reconstitution at the basilar tip. Right SCA not visualized, possibly occluded. Occlusion of the left PCA at the proximal left P2 segment. Predominant fetal type origin of  the right PCA which remains patent, although demonstrates moderate to severe multifocal atheromatous stenoses. 2. Occlusion of the right vertebral artery at its origin, and remains occluded to the vertebrobasilar junction. 3. Moderate stenosis at the origin of the left vertebral artery, with moderate stenosis at the mid left V4 segment. 4. Moderate atheromatous change about the carotid bulbs and carotid siphons, but no hemodynamically significant stenosis. 5. 8 mm left upper lobe pulmonary nodule, indeterminate. Per Fleischner Society Guidelines, recommend a non-contrast Chest CT at 6-12 months. If patient is high risk for malignancy, consider an additional non-contrast Chest CT at 18-24 months. If patient is low risk for malignancy, non-contrast Chest CT at 18-24 months is optional. These guidelines do not apply to immunocompromised patients and patients with cancer. Follow up in patients with significant comorbidities  as clinically warranted. For lung cancer screening, adhere to Lung-RADS guidelines. Reference: Radiology. 2017; 284(1):228-43. Aortic Atherosclerosis (ICD10-I70.0). Critical Value/emergent results were called by telephone at the time of interpretation on 05/02/2024 at 8:49 pm to provider XEM BUI , who verbally acknowledged these results. Electronically Signed   By: Morene Hoard M.D.   On: 05/02/2024 21:08   DG Abdomen 1 View Result Date: 05/02/2024 EXAM: 1 VIEW XRAY OF THE ABDOMEN 05/02/2024 08:19:00 PM COMPARISON: None available. CLINICAL HISTORY: OG placement. Reason for exam: OG placement; Per triage notes: PT BIB [facility] unresponsive. PT LKW 1530 by wife. Found in back yard by bystander at 1738 per EMS call. PT vomited profusely and began seizing per bystander. EMS witnessed seizure activity as well. EMS gave 10 mg Versed  IM, and 4mg  Zofran  IV. 148/96 100% NRB, RR 14 ETC02 30 CBG 116; 18G in BL AC's. FINDINGS: Enteric tube tip and side port likely within the stomach. IMPRESSION: 1.  Enteric tube tip and side port likely within the stomach. Electronically signed by: Norman Gatlin MD 05/02/2024 08:24 PM EDT RP Workstation: [id number]   CT Cervical Spine Wo Contrast Result Date: 05/02/2024 EXAM: CT CERVICAL SPINE WITHOUT CONTRAST 05/02/2024 07:19:00 PM TECHNIQUE: CT of the cervical spine was performed without the administration of intravenous contrast. Multiplanar reformatted images are provided for review. Automated exposure control, iterative reconstruction, and/or weight based adjustment of the mA/kV was utilized to reduce the radiation dose to as low as reasonably achievable. COMPARISON: None available. CLINICAL HISTORY: Polytrauma, blunt. No contrast; CT Head Wo Contrast; Neuro deficit, acute, stroke suspected; CT Cervical Spine Wo Contrast; Polytrauma, blunt. FINDINGS: CERVICAL SPINE: BONES AND ALIGNMENT: Trace anterolisthesis of C7 on T1. Trace retrolisthesis of C3 on C4 and C5 on C6. No facet subluxation or dislocation. No acute fracture. DEGENERATIVE CHANGES: Vertebral disc space narrowing most pronounced at C3-C4, C5-C6, and C6-C7. Facet arthrosis and uncovertebral hypertrophy at multiple levels. Foraminal stenosis is most pronounced at C5-C6. No evidence of high grade osseous spinal canal stenosis. SOFT TISSUES: Partially visualized endotracheal tube. Biapical pleural parenchymal scarring. Patulous appearance of the upper thoracic esophagus. No prevertebral soft tissue swelling. IMPRESSION: 1. No acute abnormality of the cervical spine. Electronically signed by: Donnice Mania MD 05/02/2024 07:48 PM EDT RP Workstation: [id number]   DG Chest Port 1 View Result Date: 05/02/2024 CLINICAL DATA:  Altered mental status EXAM: PORTABLE CHEST 1 VIEW COMPARISON:  None Available. FINDINGS: Endotracheal tube tip is about 4.4 cm superior to the carina. No acute airspace disease, pleural effusion or pneumothorax. Normal cardiac size IMPRESSION: Endotracheal tube tip about 4.4 cm superior to the  carina. No acute airspace disease. Electronically Signed   By: Luke Bun M.D.   On: 05/02/2024 19:45   CT Head Wo Contrast Result Date: 05/02/2024 EXAM: CT HEAD WITHOUT CONTRAST 05/02/2024 07:19:00 PM TECHNIQUE: CT of the head was performed without the administration of intravenous contrast. Automated exposure control, iterative reconstruction, and/or weight based adjustment of the mA/kV was utilized to reduce the radiation dose to as low as reasonably achievable. COMPARISON: CT head 05/12/2014 CLINICAL HISTORY: Neuro deficit, acute, stroke suspected. Polytrauma, blunt. FINDINGS: BRAIN AND VENTRICLES: No acute hemorrhage. No evidence of acute infarct. Nonspecific hypoattenuation in the periventricular and subcortical white matter, most likely representing chronic microvascular ischemic changes. Chronic microvascular changes and possible remote infarct in the right corona radiata extending into the basal ganglia. Mild parenchymal volume loss. Small remote infarct in the right cerebellum partially visualized. No hydrocephalus. No extra-axial collection. No mass effect  or midline shift. Atherosclerosis of the carotid siphons and intracranial vertebral arteries. ORBITS: No acute abnormality. SINUSES: Scattered mucosal thickening throughout the ethmoid sinuses and right maxillary sinus. SOFT TISSUES AND SKULL: Partially visualized endotracheal tube. No acute soft tissue abnormality. No skull fracture. IMPRESSION: 1. No acute intracranial abnormality. 2. Chronic microvascular changes and possible remote infarct in the right corona radiata extending into the basal ganglia. 3. Small remote infarct in the right cerebellum. Electronically signed by: Donnice Mania MD 05/02/2024 07:41 PM EDT RP Workstation: [id number]     Procedures   Medications Ordered in the ED  etomidate (AMIDATE) injection (20 mg Intravenous Given 05/02/24 1846)  succinylcholine (ANECTINE) injection (120 mg Intravenous Given 05/02/24 1847)   propofol (DIPRIVAN) 1000 MG/100ML infusion (0 mcg/kg/min  72 kg (Order-Specific) Intravenous Stopped 05/02/24 2150)  fentaNYL  (SUBLIMAZE ) injection 100 mcg (100 mcg Intravenous Not Given 05/02/24 1923)  fentaNYL  (SUBLIMAZE ) injection (100 mcg Intravenous Given 05/02/24 1900)  fentaNYL  in NS (40mcg/ml) infusion-PREMIX (0 mcg/hr Intravenous Stopped 05/02/24 2150)  fentaNYL  (SUBLIMAZE ) bolus via infusion 25-100 mcg (has no administration in time range)   stroke: early stages of recovery book (has no administration in time range)  0.9 %  sodium chloride  infusion (has no administration in time range)  acetaminophen  (TYLENOL ) tablet 650 mg (has no administration in time range)    Or  acetaminophen  (TYLENOL ) 160 MG/5ML solution 650 mg (has no administration in time range)    Or  acetaminophen  (TYLENOL ) suppository 650 mg (has no administration in time range)  senna-docusate (Senokot-S) tablet 1 tablet (has no administration in time range)  enoxaparin (LOVENOX) injection 40 mg (has no administration in time range)  levETIRAcetam (KEPPRA) undiluted injection 4,250 mg (4,250 mg Intravenous Given 05/02/24 1917)  iohexol  (OMNIPAQUE ) 350 MG/ML injection 100 mL (100 mLs Intravenous Contrast Given 05/02/24 2037)  LORazepam (ATIVAN) injection 2 mg (2 mg Intravenous Given 05/02/24 2046)  mannitol 20 % infusion 100 g (100 g Intravenous New Bag/Given 05/02/24 2101)                                    Medical Decision Making Amount and/or Complexity of Data Reviewed Labs: ordered. Radiology: ordered.  Risk Prescription drug management. Decision regarding hospitalization.   This patient presents to the ED for concern of seizure, this involves an extensive number of treatment options, and is a complaint that carries with it a high risk of complications and morbidity.  The differential diagnosis includes cva, seizure, brain tumor,    Co morbidities that complicate the patient evaluation  Afib,  CAD, sleep apnea, and HTN   Additional history obtained:  Additional history obtained from epic chart review External records from outside source obtained and reviewed including EMS report/wife   Lab Tests:  I Ordered, and personally interpreted labs.  The pertinent results include:  cbc nl other than wbc elevated at 12.2; cmp nl; lactic nl; ua nl; ABG with pH 7.35 and pO2 531   Imaging Studies ordered:  I ordered imaging studies including ct head/ct c spine; cxr, abd, cta, mri  I independently visualized and interpreted imaging which showed  CT head:  No acute intracranial abnormality.  2. Chronic microvascular changes and possible remote infarct in the right  corona radiata extending into the basal ganglia.  3. Small remote infarct in the right cerebellum.  CT cervical spine: No acute abnormality of the cervical spine.  CXR: Endotracheal tube  tip about 4.4 cm superior to the carina. No acute  airspace disease.  Abd: Enteric tube tip and side port likely within the stomach.  CTA head/neck: 1. Positive CTA for acute basilar thrombosis. Distal reconstitution  at the basilar tip. Right SCA not visualized, possibly occluded.  Occlusion of the left PCA at the proximal left P2 segment.  Predominant fetal type origin of the right PCA which remains patent,  although demonstrates moderate to severe multifocal atheromatous  stenoses.  2. Occlusion of the right vertebral artery at its origin, and  remains occluded to the vertebrobasilar junction.  3. Moderate stenosis at the origin of the left vertebral artery,  with moderate stenosis at the mid left V4 segment.  4. Moderate atheromatous change about the carotid bulbs and carotid  siphons, but no hemodynamically significant stenosis.  5. 8 mm left upper lobe pulmonary nodule, indeterminate. Per  Fleischner Society Guidelines, recommend a non-contrast Chest CT at  6-12 months. If patient is high risk for malignancy, consider an   additional non-contrast Chest CT at 18-24 months. If patient is low  risk for malignancy, non-contrast Chest CT at 18-24 months is  optional. These guidelines do not apply to immunocompromised  patients and patients with cancer. Follow up in patients with  significant comorbidities as clinically warranted. For lung cancer  screening, adhere to Lung-RADS guidelines. Reference: Radiology.  2017; 284(1):228-43.    Aortic Atherosclerosis (ICD10-I70.0).   I agree with the radiologist interpretation   Cardiac Monitoring:  The patient was maintained on a cardiac monitor.  I personally viewed and interpreted the cardiac monitored which showed an underlying rhythm of: afib   Medicines ordered and prescription drug management:  I ordered medication including keppra  for seizure  Reevaluation of the patient after these medicines showed that the patient stayed the same I have reviewed the patients home medicines and have made adjustments as needed   Test Considered:  Ct/mri   Critical Interventions:  Intubation/neuro consult   Consultations Obtained:  I requested consultation with the neurologist (Dr. Sallyann),  and discussed lab and imaging findings as well as pertinent plan - she will see pt in consult   Problem List / ED Course:  Seizure:  no hx of seizure d/o.  Pt given 10 mg versed  by EMS.  He is now unresponsive and I am unable to get a good neuro exam.  Afib:  when pt arrived, it was unclear if he was taking Eliquis  or not.  Admission note from April said he was, but pcp visit from May said he only wanted to be on asa and vitamins.  He refused Eliquis .  I was able to get ahold of the wife and she said he was not taking Eliquis .  She said he did not think he needed it.  CHA2DS2/VAS Stroke Risk Points  Current as of 5 minutes ago     6 >= 2 Points: High Risk  1 to 1.99 Points: Medium Risk  0 Points: Low Risk    Last Change:       Details    This score determines the  patient's risk of having a stroke if the  patient has atrial fibrillation.       Points Metrics  1 Has Congestive Heart Failure:  Yes    Current as of 5 minutes ago  1 Has Vascular Disease:  Yes    Current as of 5 minutes ago  1 Has Hypertension:  Yes    Current as of  5 minutes ago  1 Age:  62    Current as of 5 minutes ago  0 Has Diabetes Excluding Gestational Diabetes:  No    Current as of 5 minutes ago  2 Had Stroke:  Yes  Had TIA:  No  Had Thromboembolism:  No    Current as of 5 minutes ago  0 Male:  No    Current as of 5 minutes ago     CVA:  neurology consulted and Dr. Sallyann did see pt.  She reviewed scans and noted a basilar artery thrombosis, so she called a code stroke and IR.  Pt did go directly to the IR suite.  Pt now out of the window for CVA.          Reevaluation:  After the interventions noted above, I reevaluated the patient and found that they have :improved   Social Determinants of Health:  Lives at home   Dispostion:  After consideration of the diagnostic results and the patients response to treatment, I feel that the patent would benefit from admission.  CRITICAL CARE Performed by: Mliss Boyers   Total critical care time: 45 minutes  Critical care time was exclusive of separately billable procedures and treating other patients.  Critical care was necessary to treat or prevent imminent or life-threatening deterioration.  Critical care was time spent personally by me on the following activities: development of treatment plan with patient and/or surrogate as well as nursing, discussions with consultants, evaluation of patient's response to treatment, examination of patient, obtaining history from patient or surrogate, ordering and performing treatments and interventions, ordering and review of laboratory studies, ordering and review of radiographic studies, pulse oximetry and re-evaluation of patient's condition.        Final diagnoses:   Cerebrovascular accident (CVA) due to thrombosis of basilar artery (HCC)  Seizure (HCC)  Acute respiratory failure, unspecified whether with hypoxia or hypercapnia [facility])    ED Discharge Orders     None          Boyers Mliss, MD 05/02/24 2159

## 2024-05-02 NOTE — Anesthesia Postprocedure Evaluation (Signed)
 Anesthesia Post Note  Patient: Gregory Shields  Procedure(s) Performed: RADIOLOGY WITH ANESTHESIA     Patient location during evaluation: ICU Anesthesia Type: General Level of consciousness: patient remains intubated per anesthesia plan and sedated Pain management: pain level controlled Vital Signs Assessment: post-procedure vital signs reviewed and stable Respiratory status: patient on ventilator - see flowsheet for VS and patient remains intubated per anesthesia plan Cardiovascular status: stable Postop Assessment: no apparent nausea or vomiting Anesthetic complications: no Comments: Pt remains intubated in ICU, discussed with ICU nursing staff   No notable events documented.  Last Vitals:  Vitals:   05/02/24 2135 05/02/24 2140  BP: 126/69 118/77  Pulse: 66 66  Resp: 17 18  Temp: (!) 35.8 C (!) 35.7 C  SpO2: 100% 100%    Last Pain:  Vitals:   05/02/24 1946  TempSrc: Axillary                 Serafin Decatur,E. Yunique Dearcos

## 2024-05-02 NOTE — ED Notes (Signed)
 Propofol titrations and bolus initiated per Pharmacy/EDP

## 2024-05-02 NOTE — Consult Note (Signed)
 NAME:  Gregory Shields, MRN:  [id number], DOB:  1951/03/26, LOS: 0 ADMISSION DATE:  05/02/2024, CONSULTATION DATE: 9/30 REFERRING MD: Dr. Sallyann, CHIEF COMPLAINT: Stroke  History of Present Illness:  73 year old male with past medical history as below, which is significant for coronary artery disease, depression, hypertension, sleep apnea, and CVA.  He was in his usual state of health until 9/30. LKW 1530. Found unresponsive in his yard some time later.  There was some concern for seizure-like activity during EMS evaluation and he was administered Versed .  Upon arrival to the emergency department [phone] he was intubated for airway protection.  CT scan showed no acute hemorrhage.  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  He the patient was outside the window for TNK but was taken to IR where he underwent thrombectomy.  Postoperatively he was transferred to the ICU on the mechanical ventilator.  PCCM was consulted.  Pertinent  Medical History   has a past medical history of Coronary artery disease, Depression, Hypertension, Myocardial infarction [facility]), Sleep apnea, and Stroke (HCC).   Significant Hospital Events: Including procedures, antibiotic start and stop dates in addition to other pertinent events   9/30 presented with basilar artery occlusion and underwent mechanical thrombectomy.  Intubated  Interim History / Subjective:    Objective    Blood pressure 129/86, pulse 80, temperature (!) 96.3 F (35.7 C), resp. rate 14, SpO2 100%.    Vent Mode: PRVC FiO2 (%):  [100 %] 100 % Set Rate:  [16 bmp] 16 bmp Vt Set:  [500 mL] 500 mL PEEP:  [5 cmH20] 5 cmH20   Intake/Output Summary (Last 24 hours) at 05/02/2024 2339 Last data filed at 05/02/2024 2247 Gross per 24 hour  Intake 923.57 ml  Output 975 ml  Net -51.43 ml   There were no vitals filed for this visit.  Examination: General: Elderly appearing male in no acute  distress on mechanical ventilator HENT: Normocephalic, cervical collar in place, pupils 3 mm and equal Lungs: Clear bilateral breath sounds Cardiovascular: Irregularly irregular, rate controlled Abdomen: Soft, nondistended, hernia left groin Extremities: No acute deformity pedal pulses intact Neuro: Sedated GU: Foley  Resolved problem list   Assessment and Plan   Basilar artery occlusion: outside lytic window. S/p thrombectomy 9/30. Some residual occlusion per charting.  - Keep SBP 120-160 - Clevi/IVF/Neofor BP goal - Management per stroke service - CBG monitoring - Echo  Atrial fib: had not been taking Eliquis  - Hold AC for now. Restart when OK by neuro - Telemetry monitoring.   Acute respiratory failure with hypoxia secondary to unresponsiveness - Full vent support - CXR and ABG - VAP bundle - Prop and fentanyl  for RASS 0 to -1.   Possible seizure - Versed , Keppra given initially - Hold further tx for now - Can EEG if any further evidence  OSA - uncertain if on CPAP - supportive care   Labs   CBC: Recent Labs  Lab 05/02/24 1851 05/02/24 1908 05/02/24 1956 05/02/24 2146  WBC 12.2*  --   --  9.4  NEUTROABS 10.5*  --   --   --   HGB 16.2 15.6 14.3 13.4  HCT 46.0 46.0 42.0 38.3*  MCV 92.2  --   --  93.2  PLT 197  --   --  151    Basic Metabolic Panel: Recent Labs  Lab 05/02/24 1851 05/02/24 1908 05/02/24 1951 05/02/24 1956 05/02/24 2146  NA  135 137  --  135  --   K 4.2 4.1  --  3.8  --   CL 103 102  --   --   --   CO2 20*  --   --   --   --   GLUCOSE 123* 123*  --   --   --   BUN 17 19  --   --   --   CREATININE 1.03 1.00  --   --  0.87  CALCIUM  8.6*  --   --   --   --   MG  --   --  1.9  --   --    GFR: CrCl cannot be calculated (Unknown ideal weight.). Recent Labs  Lab 05/02/24 1851 05/02/24 1908 05/02/24 2146  WBC 12.2*  --  9.4  LATICACIDVEN  --  1.9  --     Liver Function Tests: Recent Labs  Lab 05/02/24 1851  AST 23  ALT 11   ALKPHOS 57  BILITOT 1.3*  PROT 6.5  ALBUMIN 3.9   No results for input(s): LIPASE, AMYLASE in the last 168 hours. No results for input(s): AMMONIA in the last 168 hours.  ABG    Component Value Date/Time   PHART 7.350 05/02/2024 1956   PCO2ART 43.3 05/02/2024 1956   PO2ART 531 (H) 05/02/2024 1956   HCO3 23.9 05/02/2024 1956   TCO2 25 05/02/2024 1956   ACIDBASEDEF 2.0 05/02/2024 1956   O2SAT 100 05/02/2024 1956     Coagulation Profile: No results for input(s): INR, PROTIME in the last 168 hours.  Cardiac Enzymes: No results for input(s): CKTOTAL, CKMB, CKMBINDEX, TROPONINI in the last 168 hours.  HbA1C: Hgb A1c MFr Bld  Date/Time Value Ref Range Status  05/02/2024 09:46 PM 4.5 (L) 4.8 - 5.6 % Final    Comment:    (NOTE) Diagnosis of Diabetes The following HbA1c ranges recommended by the American Diabetes Association (ADA) may be used as an aid in the diagnosis of diabetes mellitus.  Hemoglobin             Suggested A1C NGSP%              Diagnosis  <5.7                   Non Diabetic  5.7-6.4                Pre-Diabetic  >6.4                   Diabetic  <7.0                   Glycemic control for                       adults with diabetes.    05/14/2014 03:08 AM 5.1 <5.7 % Final    Comment:    (NOTE)                                                                       According to the ADA Clinical Practice Recommendations for 2011, when HbA1c is used as a screening test:  >=6.5%   Diagnostic of Diabetes Mellitus           (  if abnormal result is confirmed) 5.7-6.4%   Increased risk of developing Diabetes Mellitus References:Diagnosis and Classification of Diabetes Mellitus,Diabetes Care,2011,34(Suppl 1):S62-S69 and Standards of Medical Care in         Diabetes - 2011,Diabetes Care,2011,34 (Suppl 1):S11-S61.    CBG: Recent Labs  Lab 05/02/24 1848  GLUCAP 112*    Review of Systems:   Patient is encephalopathic and/or intubated;  therefore, history has been obtained from chart review.    Past Medical History:  He,  has a past medical history of Coronary artery disease, Depression, Hypertension, Myocardial infarction [facility]), Sleep apnea, and Stroke (HCC).   Surgical History:   Past Surgical History:  Procedure Laterality Date   LEFT HEART CATHETERIZATION WITH CORONARY ANGIOGRAM N/A 05/12/2014   Procedure: LEFT HEART CATHETERIZATION WITH CORONARY ANGIOGRAM;  Surgeon: Rober LOISE Chroman, MD;  Location: [facility] CATH LAB;  Service: Cardiovascular;  Laterality: N/A;     Social History:   reports that he has never smoked. He has never used smokeless tobacco. He reports current alcohol use. He reports that he does not use drugs.   Family History:  His family history includes Heart attack in his father.   Allergies Allergies  Allergen Reactions   Other Itching and Other (See Comments)    Pollen- Itchy eyes, sneezing, runny nose, etc..   Penicillins Other (See Comments)    Doesn't remember exact allergic reaction     Home Medications  Prior to Admission medications   Medication Sig Start Date End Date Taking? Authorizing Provider  aspirin  EC 325 MG tablet Take 162.5 mg by mouth daily as needed (for headaches).   Yes [provider]  B Complex Vitamins (VITAMIN B COMPLEX) TABS Take 1 tablet by mouth daily with breakfast.   Yes [provider]  Cholecalciferol (VITAMIN D3 PO) Take 1 capsule by mouth daily.   Yes [provider]  clobetasol cream (TEMOVATE) 0.05 % Apply 1 Application topically 2 (two) times daily. 04/19/24  Yes [provider]  Multiple Vitamins-Minerals (MULTIVITAMIN MEN) TABS Take 1 tablet by mouth daily with breakfast.   Yes [provider]  Omega-3 Fatty Acids (FISH OIL PO) Take 1 capsule by mouth daily.   Yes [provider]     Critical care time: 38 min      Deward Eastern, AGACNP-BC [facility]  See Amion for personal  pager PCCM on call pager [phone] until 7pm. Please call Elink 7p-7a. [phone]  05/03/2024 12:02 AM

## 2024-05-02 NOTE — Progress Notes (Signed)
 Transported patient from I.R to room 4N17 while patient was on the ventilator. Patient remained stable during transport.

## 2024-05-02 NOTE — ED Provider Notes (Signed)
 Procedure Name: Intubation Date/Time: 05/02/2024 7:13 PM  Performed by: Neldon Hamp RAMAN, PAPre-anesthesia Checklist: Patient identified, Patient being monitored, Emergency Drugs available, Timeout performed and Suction available Oxygen Delivery Method: Non-rebreather mask Preoxygenation: Pre-oxygenation with 100% oxygen Induction Type: Rapid sequence Ventilation: Mask ventilation without difficulty Laryngoscope Size: Glidescope and 3 Tube size: 7.5 mm Number of attempts: 1 Placement Confirmation: ETT inserted through vocal cords under direct vision, CO2 detector and Breath sounds checked- equal and bilateral Secured at: 23 cm Tube secured with: ETT holder Dental Injury: Teeth and Oropharynx as per pre-operative assessment         Neldon Hamp RAMAN, PA 05/02/24 LLEWELLYN Dean Clarity, MD 05/02/24 2154

## 2024-05-02 NOTE — Progress Notes (Signed)
Transported patient to C.T while patient was on the ventilator. Patient remained stable during transport.

## 2024-05-02 NOTE — Transfer of Care (Signed)
 Immediate Anesthesia Transfer of Care Note  Patient: Gregory Shields  Procedure(s) Performed: RADIOLOGY WITH ANESTHESIA  Patient Location: ICU  Anesthesia Type:General  Level of Consciousness: Patient remains intubated per anesthesia plan  Airway & Oxygen Therapy: Patient remains intubated per anesthesia plan and Patient placed on Ventilator (see vital sign flow sheet for setting)  Post-op Assessment: Report given to RN and Post -op Vital signs reviewed and stable  Post vital signs: Reviewed and stable  Last Vitals:  Vitals Value Taken Time  BP 130/82 05/02/24 23:18  Temp    Pulse 85 05/02/24 23:25  Resp 15 05/02/24 23:25  SpO2 97 % 05/02/24 23:25  Vitals shown include unfiled device data.  Last Pain:  Vitals:   05/02/24 1946  TempSrc: Axillary         Complications: No notable events documented.

## 2024-05-02 NOTE — Consult Note (Signed)
 NEUROLOGY H&P NOTE   Date of service: May 03, 2024 Patient Name: Gregory Shields MRN:  [id number] DOB:  11/24/1950 Chief Complaint: Seizures  History of Present Illness  Gregory Shields is a 73 y.o. 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 Last known normal was around 330pm by wife. Around 530pm, neighbor found patient down with seizure activity. EMS arrived who also witnessed seizure activity and gave IM versed  10mg . Upon presentation to ED, patient was not protecting airway and was therefore intubated and sedated on propofol. Patient loaded with Keppra 4.25mg . CTH obtained which reported no acute abnormality. Neurology was then consulted for first-time seizure. Upon review of CTH, pursued CTA/CTP which showed basilar occlusion.   Patient demonstrated intermittent spontaneous bilateral triple flexion. Therefore given mannitol and ativan 2mg  with resolution.    Last known well: 330pm 05/02/24 Pre-Modified rankin score: 0 IV Thrombolysis: No (outside of window) Thrombectomy: Yes  ICH Score: N/A NIHSS components Score: Comment  1a Level of Conscious 0[]  1[]  2[]  3[x]      1b LOC Questions 0[]  1[]  2[x]       1c LOC Commands 0[]  1[]  2[x]       2 Best Gaze 0[x]  1[]  2[]       3 Visual 0[]  1[]  2[]  3[x]      4 Facial Palsy 0[x]  1[]  2[]  3[]      5a Motor Arm - left 0[]  1[]  2[]  3[x]  4[]  UN[]    5b Motor Arm - Right 0[]  1[]  2[]  3[]  4[x]  UN[]    6a Motor Leg - Left 0[]  1[]  2[]  3[x]  4[]  UN[]    6b Motor Leg - Right 0[]  1[]  2[]  3[x]  4[]  UN[]    7 Limb Ataxia 0[]  1[]  2[]  UN[x]      8 Sensory 0[]  1[]  2[x]  UN[]      9 Best Language 0[]  1[]  2[]  3[x]      10 Dysarthria 0[]  1[]  2[]  UN[x]    Intubated  11 Extinct. and Inattention 0[x]  1[]  2[]       TOTAL: 28      ROS  Unable to obtain due to mental status   Past History   Past Medical History:   Diagnosis Date   Coronary artery disease    Depression    Hypertension    Myocardial infarction [facility])    Sleep apnea    Stroke [facility])    Past Surgical History:  Procedure Laterality Date   IR PERCUTANEOUS ART THROMBECTOMY/INFUSION INTRACRANIAL INC DIAG ANGIO  05/02/2024   IR US  GUIDE VASC ACCESS RIGHT  05/02/2024   LEFT HEART CATHETERIZATION WITH CORONARY ANGIOGRAM N/A 05/12/2014   Procedure: LEFT HEART CATHETERIZATION WITH CORONARY ANGIOGRAM;  Surgeon: Rober LOISE Chroman, MD;  Location: [facility] CATH LAB;  Service: Cardiovascular;  Laterality: N/A;   Family History  Problem Relation Age of Onset   Heart attack Father    Social History   Socioeconomic History   Marital status: Unknown    Spouse name: Not on file   Number of children: 0   Years of education: 72   Highest education level: Not on file  Occupational History   Not on file  Tobacco Use   Smoking status: Never   Smokeless tobacco: Never  Vaping Use   Vaping status: Never Used  Substance and Sexual Activity   Alcohol use: Yes    Comment: OCCASION   Drug use: No   Sexual activity: Yes  Other  Topics Concern   Not on file  Social History Narrative   Patient is married with 2 adopted children.   Patient is left handed.   Patient has 16 yrs of education.   Patient drinks 2 cups daily.   Social Drivers of Corporate investment banker Strain: Not on file  Food Insecurity: No Food Insecurity (11/20/2023)   Hunger Vital Sign    Worried About Running Out of Food in the Last Year: Never true    Ran Out of Food in the Last Year: Never true  Transportation Needs: No Transportation Needs (11/20/2023)   PRAPARE - Administrator, Civil Service (Medical): No    Lack of Transportation (Non-Medical): No  Physical Activity: Not on file  Stress: Not on file  Social Connections: Unknown (11/20/2023)   Social Connection and Isolation Panel    Frequency of Communication with Friends and Family: Twice a week    Frequency  of Social Gatherings with Friends and Family: Once a week    Attends Religious Services: 1 to 4 times per year    Active Member of [employer] or Organizations: Patient unable to answer    Attends Banker Meetings: Never    Marital Status: Patient unable to answer   Allergies  Allergen Reactions   Other Itching and Other (See Comments)    Pollen- Itchy eyes, sneezing, runny nose, etc..   Penicillins Other (See Comments)    Doesn't remember exact allergic reaction    Medications   Current Facility-Administered Medications:     stroke: early stages of recovery book, , Does not apply, Once, Wilhelmenia Addis M, MD   0.9 %  sodium chloride  infusion, , Intravenous, Continuous, Zeah Germano M, MD, Last Rate: 40 mL/hr at 05/03/24 0400, Infusion Verify at 05/03/24 0400   0.9 %  sodium chloride  infusion, 250 mL, Intravenous, Continuous, Paliwal, Aditya, MD   acetaminophen  (TYLENOL ) tablet 650 mg, 650 mg, Oral, Q4H PRN **OR** acetaminophen  (TYLENOL ) 160 MG/5ML solution 650 mg, 650 mg, Per Tube, Q4H PRN **OR** acetaminophen  (TYLENOL ) suppository 650 mg, 650 mg, Rectal, Q4H PRN, Keenan Dimitrov M, MD   Chlorhexidine Gluconate Cloth 2 % PADS 6 each, 6 each, Topical, Daily, Cherrie Franca M, MD, 6 each at 05/02/24 2355   clevidipine (CLEVIPREX) infusion 0.5 mg/mL, 0-21 mg/hr, Intravenous, Continuous, Ray Coy, MD, Held at 05/02/24 2334   enoxaparin (LOVENOX) injection 40 mg, 40 mg, Subcutaneous, Q24H, Doniqua Saxby M, MD   famotidine (PEPCID) tablet 20 mg, 20 mg, Per Tube, BID, Hoffman, Paul W, NP, 20 mg at 05/03/24 0104   fentaNYL  (SUBLIMAZE ) bolus via infusion 25-100 mcg, 25-100 mcg, Intravenous, Q15 min PRN, Haviland, Julie, MD   fentaNYL  (SUBLIMAZE ) injection 100 mcg, 100 mcg, Intravenous, Once, Dean Clarity, MD   fentaNYL  in NS (33mcg/ml) infusion-PREMIX, 0-400 mcg/hr, Intravenous, Continuous, Dean Clarity, MD, Last Rate: 5 mL/hr at 05/03/24 0400, 50 mcg/hr at 05/03/24 0400   Oral care mouth  rinse, 15 mL, Mouth Rinse, Q2H, Yigit Norkus M, MD, 15 mL at 05/03/24 0437   Oral care mouth rinse, 15 mL, Mouth Rinse, PRN, Ian Castagna M, MD   phenylephrine (NEO-SYNEPHRINE) 20mg /NS 250mL premix infusion, 25-200 mcg/min, Intravenous, Titrated, Paliwal, Aditya, MD, Last Rate: 26.3 mL/hr at 05/03/24 0400, 35 mcg/min at 05/03/24 0400   propofol (DIPRIVAN) 1000 MG/100ML infusion, 0-80 mcg/kg/min (Order-Specific), Intravenous, Continuous, Dean Clarity, MD, Last Rate: 17.28 mL/hr at 05/03/24 0436, 40 mcg/kg/min at 05/03/24 0436   senna-docusate (Senokot-S) tablet 1 tablet,  1 tablet, Oral, QHS PRN, Avenly Roberge M, MD   sodium chloride  0.9 % bolus 250 mL, 250 mL, Intravenous, PRN, Ray Coy, MD, Last Rate: 999 mL/hr at 05/03/24 0400, Infusion Verify at 05/03/24 0400   Vitals   Vitals:   05/03/24 0245 05/03/24 0300 05/03/24 0315 05/03/24 0330  BP: 112/78 131/81 132/83 127/65  Pulse:  (!) 59    Resp:  16    Temp:  (!) 97 F (36.1 C)    TempSrc:      SpO2:  100%       There is no height or weight on file to calculate BMI.  Physical Exam   Constitutional: Appears well-developed and well-nourished.  Eyes: No scleral injection.  HENT: No OP obstruction.  Head: Normocephalic.  Cardiovascular: Normal rate and regular rhythm.  Respiratory: Intubated GI: Soft. Skin: WDI.   Neurologic Examination   Mental status: sedated Speech: sedated Cranial nerves: PERRL (2->59mm, sluggish, equal) Does not blink to threat Face appears symmetric (given ETT) Motor: Normal bulk and tone. Intermittent BLE triple flexion Sensory: Deferred Reflexes: Deferred Coordination: Deferred Gait: deferred   Labs   CBC:  Recent Labs  Lab 05/02/24 1851 05/02/24 1908 05/02/24 1956 05/02/24 2146  WBC 12.2*  --   --  9.4  NEUTROABS 10.5*  --   --   --   HGB 16.2   < > 14.3 13.4  HCT 46.0   < > 42.0 38.3*  MCV 92.2  --   --  93.2  PLT 197  --   --  151   < > = values in this interval not displayed.    Basic Metabolic Panel:  Lab Results  Component Value Date   NA 135 05/02/2024   K 3.8 05/02/2024   CO2 20 (L) 05/02/2024   GLUCOSE 123 (H) 05/02/2024   BUN 19 05/02/2024   CREATININE 0.87 05/02/2024   CALCIUM  8.6 (L) 05/02/2024   GFRNONAA >60 05/02/2024   GFRAA 72 06/21/2020   Lipid Panel:  Lab Results  Component Value Date   LDLCALC 124 (H) 05/03/2024   HgbA1c:  Lab Results  Component Value Date   HGBA1C 4.5 (L) 05/02/2024   Urine Drug Screen:     Component Value Date/Time   LABOPIA NONE DETECTED 05/02/2024 2004   COCAINSCRNUR NONE DETECTED 05/02/2024 2004   LABBENZ POSITIVE (A) 05/02/2024 2004   AMPHETMU NONE DETECTED 05/02/2024 2004   THCU POSITIVE (A) 05/02/2024 2004   LABBARB NONE DETECTED 05/02/2024 2004    Alcohol Level     Component Value Date/Time   ETH <15 05/03/2024 0000   INR  Lab Results  Component Value Date   INR 1.45 05/12/2014   APTT  Lab Results  Component Value Date   APTT 34 11/18/2023     CT Head without contrast(Personally reviewed): Questionable occlusion of basilar and left SCA.  CT angio Head and Neck with contrast(Personally reviewed): Basilar occlusion with distal reconstitution. L P2, R SCA, and R vert occlusion.  Assessment   Marcquis Ridlon is a 73 y.o. 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 Now s/p TICI 2B.   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   Secondary Diagnosis: Heart failure, unspecified, Chronic systolic (congestive) heart failure, and Chronic atrial fibrillation  Recommendations  Cerebrovascular\Neuro  Stroke:  basilar occlusion Etiology:  atrial fibrillation, atherosclerosis Code Stroke: CT head with questionable basilar occlusion. ASPECTS 10.  CTA head & neck showed occlusion of basilar, R SCA, L  PCA, R vert. Atherosclerotic plaques in  CT perfusion: n/a Post IR CT without acute infarct or ICH. MRI  ordered 2D Echo ordered LDL 124 HgbA1c 4.5 VTE prophylaxis - Enoxaparin    Diet   Diet NPO time specified   Supposed to be on Eliquis  but non-compliant for at least a few months prior to admission. Therapy recommendations:  Ordered Disposition:  TBD  Seizures Semiology: Unknown S/p Keppra 4.25g load on 9/30. Maintenance 1g BID. Sedated with propofol.  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  Obesity, There is no height or weight on file to calculate BMI., BMI >/= 30 associated with increased stroke risk, recommend weight loss, diet and exercise as appropriate  Hx stroke/TIA Congestive heart failure   Cardiovascular  -Blood pressure goal as per interventional neuroradiology  Respiratory  -Intubated  Hematology  - platelet 151 - H/H  13.4/38.3  ID\Immunology  -WBC 9.4  Renal  -Cr 0.87  Endocrine\Metabolic   GI\Nutrition  -Diet: NPO -Bowel Regimen: PRN senna  Fluid\Electrolytes  -IVF: PRN -Sodium Goal: 135-145 -Electrolyte Replacement: PRN  Prophylaxis\Quality\Safety:  -Venous Thrombosis: Enoxaparin -GI   Code Status: Full Code Care Level: ICU Disposition:  TBD ______________________________________________________________________   Signed, Oluwadara Gorman M Renleigh Ouellet, MD [facility]

## 2024-05-02 NOTE — ED Notes (Signed)
 Pt to CT at this time.

## 2024-05-02 NOTE — Progress Notes (Incomplete)
 eLink Physician-Brief Progress Note Patient Name: Gregory Shields DOB: 20-Apr-1951 MRN: [id number]   Date of Service  05/02/2024  HPI/Events of Note  73 year old with a history of CVA, A-fib, coronary artery disease, sleep apnea and hypertension who presents with new basilar artery stroke status post thrombectomy who returns to the ICU intubated, sedated  Vital signs are within normal limits.  Patient is mechanically ventilated.  Results are consistent with adequate oxygenation and ventilation.  UDS positive for benzos and THC.  Interval CT scans reviewed and stable.  eICU Interventions  Maintain mechanical ventilation, daily spontaneous awakening/breathing trials       Intervention Category Evaluation Type: New Patient Evaluation  Legion Discher 05/02/2024, 11:57 PM

## 2024-05-02 NOTE — Procedures (Signed)
  NEUROSURGERY BRIEF THROMBECTOMY NOTE   PREOP DX: Basilar occlusion  POSTOP DX: Same  PROCEDURE: Basilar thrombectomy  SURGEON: Charleene Callegari   ANESTHESIA: GETA  EBL: Minimal  Number of Passes: 3  Technique: ASPIRATION  Final TICI score: 2B  Post OP blood pressure goal: SBP<160  Arterial Angioplasty or Stent: No   Anti-Platelet Therapy: No   COMPLICATIONS: No   CONDITION: Stable to ICU  FINDINGS (Full report in [facility]): 1. Acute basilar occlusion with successful recanalization 2. Single pass performed in the left P1 segment with residual occlusion (age-indeterminate)   Gregory Shields  @today @ 10:54 PM

## 2024-05-02 NOTE — ED Notes (Signed)
 Pt taken to and from CT with RT/RN on the monitor.

## 2024-05-02 NOTE — Anesthesia Preprocedure Evaluation (Addendum)
 Anesthesia Evaluation  Patient identified by MRN, date of birth, ID bandGeneral Assessment Comment:Pt intubated and sedated, post ictal, from ED  Reviewed: Allergy & Precautions, Patient's Chart, lab work & pertinent test results, Unable to perform ROS - Chart review onlyPreop documentation limited or incomplete due to emergent nature of procedure.  History of Anesthesia Complications Negative for: history of anesthetic complications  Airway Mallampati: Intubated  TM Distance: >3 FB    Comment: C-Collar in place, neck stabilized Dental   Not assessed, already intubated:   Pulmonary sleep apnea  Intubated in ED   breath sounds clear to auscultation       Cardiovascular hypertension (no longer on meds), + CAD and + Past MI (2015)  + dysrhythmias Atrial Fibrillation  Rhythm:Irregular Rate:Normal  11/2023 ECHO:  1. Hypokinesis/akinesis of the distal anterior, distal inferior and  apical wall segments . Left ventricular EF 45 to 50%. The left ventricle has mildly decreased function.   2. RVF is normal. The right ventricular size is normal. There is normal pulmonary artery systolic pressure. The estimated right ventricular systolic pressure is 33.4 mmHg.   3. Left atrial size was moderately dilated.   4. The mitral valve is normal in structure. Mild mitral valve regurgitation.   5. The aortic valve is tricuspid. Aortic valve regurgitation is not visualized.     Neuro/Psych    Depression    PT LKW 1530 by wife. Found in back yard by bystander at 1738 per EMS call.  PT vomited profusely and began seizing per bystander.  EMS witnessed seizure activity as well TIACVA (acute CVA)    GI/Hepatic   Endo/Other    Renal/GU      Musculoskeletal   Abdominal   Peds  Hematology Hb 14.3, plt 197k No longer takes Eliquis    Anesthesia Other Findings   Reproductive/Obstetrics                              Anesthesia  Physical Anesthesia Plan  ASA: 4 and emergent  Anesthesia Plan: General   Post-op Pain Management: Minimal or no pain anticipated   Induction: Inhalational  PONV Risk Score and Plan: 2 and Ondansetron  and Dexamethasone  Airway Management Planned: Oral ETT  Additional Equipment: None  Intra-op Plan:   Post-operative Plan: Post-operative intubation/ventilation  Informed Consent:      Only emergency history available  Plan Discussed with: CRNA and Surgeon  Anesthesia Plan Comments: (Emergent, family not present)         Anesthesia Quick Evaluation

## 2024-05-02 NOTE — Code Documentation (Addendum)
 Responded to Code Stroke called on pt who had arrived to the ED at 1841 after vomiting/going unresponsive in his backyard while with family. Versed  administered by EMS PTA ED d/t concern for seizure-like activity. Pt arrived and was intubated for airway protection. Pt was then taken to CT showing no hemorrhage. 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 Code Stroke activated at 2057.  CBG-123, NIH-30(done post intubation), TNK not given-pt outside window. IR paged out at 2117 and pt txed to IR, arriving at 2145. Plan admit to Neuro ICU post-procedure.

## 2024-05-03 ENCOUNTER — Inpatient Hospital Stay (HOSPITAL_COMMUNITY)

## 2024-05-03 ENCOUNTER — Encounter (HOSPITAL_COMMUNITY): Payer: Self-pay | Admitting: Interventional Radiology

## 2024-05-03 ENCOUNTER — Encounter (HOSPITAL_COMMUNITY)

## 2024-05-03 DIAGNOSIS — I639 Cerebral infarction, unspecified: Secondary | ICD-10-CM | POA: Diagnosis not present

## 2024-05-03 DIAGNOSIS — I6501 Occlusion and stenosis of right vertebral artery: Secondary | ICD-10-CM | POA: Diagnosis not present

## 2024-05-03 DIAGNOSIS — I613 Nontraumatic intracerebral hemorrhage in brain stem: Secondary | ICD-10-CM

## 2024-05-03 DIAGNOSIS — I6622 Occlusion and stenosis of left posterior cerebral artery: Secondary | ICD-10-CM | POA: Diagnosis not present

## 2024-05-03 DIAGNOSIS — J9601 Acute respiratory failure with hypoxia: Secondary | ICD-10-CM | POA: Diagnosis not present

## 2024-05-03 DIAGNOSIS — I6302 Cerebral infarction due to thrombosis of basilar artery: Principal | ICD-10-CM

## 2024-05-03 DIAGNOSIS — R569 Unspecified convulsions: Secondary | ICD-10-CM | POA: Diagnosis not present

## 2024-05-03 DIAGNOSIS — I6389 Other cerebral infarction: Secondary | ICD-10-CM

## 2024-05-03 DIAGNOSIS — R29730 NIHSS score 30: Secondary | ICD-10-CM

## 2024-05-03 DIAGNOSIS — I4891 Unspecified atrial fibrillation: Secondary | ICD-10-CM | POA: Diagnosis not present

## 2024-05-03 DIAGNOSIS — E440 Moderate protein-calorie malnutrition: Secondary | ICD-10-CM | POA: Insufficient documentation

## 2024-05-03 DIAGNOSIS — J9600 Acute respiratory failure, unspecified whether with hypoxia or hypercapnia: Secondary | ICD-10-CM

## 2024-05-03 LAB — BASIC METABOLIC PANEL WITH GFR
Anion gap: 11 (ref 5–15)
BUN: 12 mg/dL (ref 8–23)
CO2: 19 mmol/L — ABNORMAL LOW (ref 22–32)
Calcium: 8.2 mg/dL — ABNORMAL LOW (ref 8.9–10.3)
Chloride: 106 mmol/L (ref 98–111)
Creatinine, Ser: 1.02 mg/dL (ref 0.61–1.24)
GFR, Estimated: >60 mL/min (ref >60)
Glucose, Bld: 91 mg/dL (ref 70–99)
Potassium: 3.8 mmol/L (ref 3.5–5.1)
Sodium: 136 mmol/L (ref 135–145)

## 2024-05-03 LAB — LIPID PANEL
Cholesterol: 185 mg/dL (ref 0–200)
HDL: 48 mg/dL (ref >40)
LDL Cholesterol: 124 mg/dL — ABNORMAL HIGH (ref 0–99)
Total CHOL/HDL Ratio: 3.9 ratio
Triglycerides: 63 mg/dL (ref <150)
VLDL: 13 mg/dL (ref 0–40)

## 2024-05-03 LAB — ECHOCARDIOGRAM COMPLETE
Area-P 1/2: 4.37 cm2
S' Lateral: 3.2 cm
Weight: 2645.52 [oz_av]

## 2024-05-03 LAB — GLUCOSE, CAPILLARY
Glucose-Capillary: 95 mg/dL (ref 70–99)
Glucose-Capillary: 82 mg/dL (ref 70–99)
Glucose-Capillary: 84 mg/dL (ref 70–99)
Glucose-Capillary: 95 mg/dL (ref 70–99)
Glucose-Capillary: 94 mg/dL (ref 70–99)
Glucose-Capillary: 122 mg/dL — ABNORMAL HIGH (ref 70–99)

## 2024-05-03 LAB — CBC
HCT: 43.6 % (ref 39.0–52.0)
Hemoglobin: 15.1 g/dL (ref 13.0–17.0)
MCH: 32.8 pg (ref 26.0–34.0)
MCHC: 34.6 g/dL (ref 30.0–36.0)
MCV: 94.8 fL (ref 80.0–100.0)
Platelets: 184 K/uL (ref 150–400)
RBC: 4.6 MIL/uL (ref 4.22–5.81)
RDW: 12.8 % (ref 11.5–15.5)
WBC: 12.3 K/uL — ABNORMAL HIGH (ref 4.0–10.5)
nRBC: 0 % (ref 0.0–0.2)

## 2024-05-03 LAB — MAGNESIUM: Magnesium: 1.9 mg/dL (ref 1.7–2.4)

## 2024-05-03 LAB — MRSA NEXT GEN BY PCR, NASAL: MRSA by PCR Next Gen: NOT DETECTED

## 2024-05-03 LAB — ETHANOL: Alcohol, Ethyl (B): <15 mg/dL (ref <15)

## 2024-05-03 LAB — OSMOLALITY: Osmolality: 296 mosm/kg — ABNORMAL HIGH (ref 275–295)

## 2024-05-03 MED ORDER — ATORVASTATIN CALCIUM 40 MG PO TABS
40.0000 mg | ORAL_TABLET | Freq: Every day | ORAL | Status: DC
  Administered 2024-05-04 – 2024-05-14 (×11): 40 mg
  Filled 2024-05-03 (×11): qty 1

## 2024-05-03 MED ORDER — MAGNESIUM SULFATE 2 GM/50ML IV SOLN
2.0000 g | Freq: Once | INTRAVENOUS | Status: AC
  Administered 2024-05-03: 2 g via INTRAVENOUS
  Filled 2024-05-03: qty 50

## 2024-05-03 MED ORDER — PHENYLEPHRINE HCL-NACL 20-0.9 MG/250ML-% IV SOLN
25.0000 ug/min | INTRAVENOUS | Status: DC
  Administered 2024-05-03: 55 ug/min via INTRAVENOUS
  Administered 2024-05-03: 50 ug/min via INTRAVENOUS
  Administered 2024-05-03 – 2024-05-04 (×2): 25 ug/min via INTRAVENOUS
  Filled 2024-05-03: qty 500
  Filled 2024-05-03 (×2): qty 250

## 2024-05-03 MED ORDER — SODIUM CHLORIDE 0.9 % IV SOLN: 250.0000 mL | INTRAVENOUS | Status: AC

## 2024-05-03 MED ORDER — ASPIRIN 325 MG PO TABS
325.0000 mg | ORAL_TABLET | Freq: Every day | ORAL | Status: DC
  Administered 2024-05-03 – 2024-05-04 (×2): 325 mg
  Filled 2024-05-03 (×2): qty 1

## 2024-05-03 MED ORDER — SODIUM CHLORIDE 0.9 % IV SOLN: INTRAVENOUS | Status: AC

## 2024-05-03 MED ORDER — PERFLUTREN LIPID MICROSPHERE
1.0000 mL | INTRAVENOUS | Status: AC | PRN
  Administered 2024-05-03: 2 mL via INTRAVENOUS

## 2024-05-03 MED ORDER — GADOBUTROL 1 MMOL/ML IV SOLN
7.5000 mL | Freq: Once | INTRAVENOUS | Status: AC | PRN
  Administered 2024-05-03: 7.5 mL via INTRAVENOUS

## 2024-05-03 MED ORDER — ATORVASTATIN CALCIUM 80 MG PO TABS
80.0000 mg | ORAL_TABLET | Freq: Every day | ORAL | Status: DC
  Administered 2024-05-03: 80 mg
  Filled 2024-05-03: qty 1

## 2024-05-03 MED ORDER — LEVETIRACETAM (KEPPRA) 500 MG/5 ML ADULT IV PUSH
1000.0000 mg | Freq: Two times a day (BID) | INTRAVENOUS | Status: DC
  Administered 2024-05-03 – 2024-05-08 (×11): 1000 mg via INTRAVENOUS
  Filled 2024-05-03 (×11): qty 10

## 2024-05-03 NOTE — Progress Notes (Signed)
 Patient was transported to MRI & back to 4N17 without any complications.

## 2024-05-03 NOTE — Progress Notes (Signed)
 Pharmacy Electrolyte Replacement  Recent Labs:  Recent Labs    05/02/24 1951 05/02/24 1956 05/02/24 2146  K  --  3.8  --   MG 1.9  --   --   CREATININE  --   --  0.87    Low Critical Values (K </= 2.5, Phos </= 1, Mg </= 1) Present: None  Plan: Give magnesium sulfate 2 g IV once.   Ginia Rudell, PharmD

## 2024-05-03 NOTE — Progress Notes (Signed)
 SLP Cancellation Note  Patient Details Name: Gregory Shields MRN: [id number] DOB: [birth year]   Cancelled treatment:       Reason Eval/Treat Not Completed: Medical issues which prohibited therapy (Patient intubated. will f/u next date)  Rea Pass MA, CCC-SLP  [name] [name] 05/03/2024, 12:56 PM

## 2024-05-03 NOTE — Progress Notes (Signed)
 STAT LTM EEG hooked up and running - no initial skin breakdown - push button tested - [facility] monitoring. MRI safe leads

## 2024-05-03 NOTE — Progress Notes (Signed)
 Initial Nutrition Assessment  DOCUMENTATION CODES:   Non-severe (moderate) malnutrition in context of social or environmental circumstances  INTERVENTION:  If pt unable to extubate or requires nutrition support, recommend: recommend beginning at 62ml/h and increasing 10 ml q8h until goal rate reached Initiate tube feeding via NG/Cortrak: Osmolite 1.5 at 55 ml/h (1320 ml per day) Prosource TF20 60 ml daily Provides 2060 kcal, 102 gm protein, 1005 ml free water daily  Monitor magnesium and phosphorus daily x 4 occurrences, MD to replete as needed, as pt is at risk for refeeding syndrome given moderate malnutrition and limited diet hx. Thiamine 100 mg daily for 7 days  NUTRITION DIAGNOSIS:   Moderate Malnutrition related to social / environmental circumstances as evidenced by mild muscle depletion, moderate fat depletion, severe fat depletion.  GOAL:   Patient will meet greater than or equal to 90% of their needs  MONITOR:   Vent status, Labs  REASON FOR ASSESSMENT:   Ventilator    ASSESSMENT:   Pt with hx of stroke, heart failure, atrial fibrillation, and CAD. 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  9/30 admitted; intubated, thrombectomy  CCM and Neuro following. Spoke with RN who reports will not be extubated today, but likely tomorrow. MD reports pt will likely need Cortrak placement on Friday but wants to wait until SLP eval following extubation. No visitors present at bedside during assessment. Unable to obtain diet/wt hx. Chart review has limited wt hx for last year. Physical exam shows moderate to severe fat depletions and mild muscle depletions. Suspect further depletions would be seen in facial areas but unable to assess today. Pt at risk for refeeding given he meets criteria for moderate malnutrition. Hiatal hernia noted on previous abdominal CT 11/2023 and RN noted during admission.    Patient is currently intubated on ventilator support MV: 8.1 L/min Temp (24hrs), Avg:96.6 F (35.9 C), Min:94.8 F (34.9 C), Max:99.5 F (37.5 C) MAP (cuff): 94 mmHg  Propofol: 12.96 ml/hr *provides 342 kcal per day at this rate*  Admit weight: 75 kg    Intake/Output Summary (Last 24 hours) at 05/03/2024 1210 Last data filed at 05/03/2024 0700 Gross per 24 hour  Intake 2353.2 ml  Output 2125 ml  Net 228.2 ml   Net IO Since Admission: 228.2 mL [05/03/24 1210]  Drains/Lines: OG tube: gastric per xray Urethral Catheter: UOP 2100 mL x 24 hr  Nutritionally Relevant Medications: Scheduled Meds:  famotidine  20 mg Per Tube BID   levETIRAcetam  1,000 mg Intravenous Q12H   Continuous Infusions:  sodium chloride  40 mL/hr at 05/03/24 0700   clevidipine Stopped (05/02/24 2334)   magnesium sulfate bolus IVPB     phenylephrine (NEO-SYNEPHRINE) Adult infusion 55 mcg/min (05/03/24 0753)   propofol (DIPRIVAN) infusion 30 mcg/kg/min (05/03/24 0700)   sodium chloride  999 mL/hr at 05/03/24 0700   PRN Meds: senna-docusate, sodium chloride   Labs Reviewed: CBG ranges from 82-112 mg/dL over the last 24 hours HgbA1c 4.5   NUTRITION - FOCUSED PHYSICAL EXAM:  Flowsheet Row Most Recent Value  Orbital Region Unable to assess  [vent]  Upper Arm Region Moderate depletion  Thoracic and Lumbar Region Severe depletion  Buccal Region Unable to assess  [vent]  Temple Region Unable to assess  [EEG electrodes]  Clavicle Bone Region Mild depletion  Clavicle and Acromion Bone Region Mild depletion  Scapular Bone Region Unable to assess  Dorsal Hand Mild depletion  Patellar Region Mild depletion  Anterior Thigh Region Mild depletion  Posterior Calf Region Mild depletion  Edema (RD Assessment) None  Hair Reviewed  Eyes Unable to assess  Mouth Unable to assess  Skin Reviewed  Nails Reviewed    Diet Order:   Diet Order             Diet NPO time specified  Diet effective now                    EDUCATION NEEDS:   Not appropriate for education at this time  Skin:     Last BM:  PTA  Height:   Ht Readings from Last 1 Encounters:  11/18/23 5' 7.99 (1.727 m)    Weight:   Wt Readings from Last 1 Encounters:  05/02/24 75 kg    Ideal Body Weight:  70 kg  BMI:  Body mass index is 25.15 kg/m.  Estimated Nutritional Needs:   Kcal:  1900-2100  Protein:  90-110g  Fluid:  1.9-2.1L    Josette Glance, MS, RDN, LDN Clinical Dietitian I Please reach out via secure chat

## 2024-05-03 NOTE — Progress Notes (Signed)
 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  Anticipate weaning sedation and SBT this afternoon.  Anticoagulation strategy, in part, to be influenced by pending evaluations. Activity: No limitation from femoral access standpoint.  Please call with questions or concerns.  Overnight Events: Remain intubated and sedated  Subjective: Unable to obtain  Objective: Physical Exam: BP 127/74   Pulse 60   Temp 99.5 F (37.5 C)   Resp 16   Wt 75 kg   SpO2 100%   BMI 25.15 kg/m   Intubated and sedated Access site C/D/I Distal extremity warm and well perfused   Current Facility-Administered Medications:     stroke: early stages of recovery book, , Does not apply, Once, Bui, Xem M, MD   0.9 %  sodium chloride  infusion, , Intravenous, Continuous, Bui, Xem M, MD, Last Rate: 40 mL/hr at 05/03/24 0700, Infusion Verify at 05/03/24 0700   0.9 %  sodium chloride  infusion, 250 mL, Intravenous, Continuous, Paliwal, Aditya, MD   acetaminophen  (TYLENOL ) tablet 650 mg, 650 mg, Oral, Q4H PRN **OR** acetaminophen  (TYLENOL ) 160 MG/5ML solution 650 mg, 650 mg, Per Tube, Q4H PRN **OR** acetaminophen  (TYLENOL ) suppository 650 mg, 650 mg, Rectal, Q4H PRN, Bui, Xem M, MD   aspirin  tablet 325 mg, 325 mg, Per Tube, Daily, Jerri Pfeiffer, MD   atorvastatin  (LIPITOR ) tablet 80 mg, 80 mg, Per Tube, Daily, Bui, Xem M, MD   Chlorhexidine Gluconate Cloth 2 % PADS 6 each, 6 each, Topical, Daily, Bui, Xem M, MD, 6 each at 05/02/24 2355   clevidipine (CLEVIPREX) infusion 0.5 mg/mL, 0-21 mg/hr, Intravenous, Continuous, Ray Coy, MD, Held at 05/02/24 2334   enoxaparin (LOVENOX) injection 40 mg, 40 mg, Subcutaneous, Q24H, Bui, Xem M, MD   famotidine (PEPCID) tablet 20 mg, 20 mg, Per Tube, BID, Hoffman, Paul W, NP, 20 mg at 05/03/24 0104   fentaNYL  (SUBLIMAZE )  bolus via infusion 25-100 mcg, 25-100 mcg, Intravenous, Q15 min PRN, Haviland, Julie, MD   fentaNYL  (SUBLIMAZE ) injection 100 mcg, 100 mcg, Intravenous, Once, Dean Clarity, MD   fentaNYL  in NS (80mcg/ml) infusion-PREMIX, 0-400 mcg/hr, Intravenous, Continuous, Dean Clarity, MD, Last Rate: 5 mL/hr at 05/03/24 0700, 50 mcg/hr at 05/03/24 0700   levETIRAcetam (KEPPRA) undiluted injection 1,000 mg, 1,000 mg, Intravenous, Q12H, Bui, Xem M, MD, 1,000 mg at 05/03/24 0616   magnesium sulfate IVPB 2 g 50 mL, 2 g, Intravenous, Once, Denninger, Jade M, RPH   Oral care mouth rinse, 15 mL, Mouth Rinse, Q2H, Bui, Xem M, MD, 15 mL at 05/03/24 0519   Oral care mouth rinse, 15 mL, Mouth Rinse, PRN, Sallyann, Xem M, MD   perflutren  lipid microspheres (DEFINITY ) IV suspension, 1-10 mL, Intravenous, PRN, Bui, Xem M, MD, 2 mL at 05/03/24 0818   phenylephrine (NEO-SYNEPHRINE) 20mg /NS 250mL premix infusion, 25-200 mcg/min, Intravenous, Titrated, Paliwal, Aditya, MD, Last Rate: 41.3 mL/hr at 05/03/24 0753, 55 mcg/min at 05/03/24 0753   propofol (DIPRIVAN) 1000 MG/100ML infusion, 0-80 mcg/kg/min (Order-Specific), Intravenous, Continuous, Dean Clarity, MD, Last Rate: 12.96 mL/hr at 05/03/24 0700, 30 mcg/kg/min at 05/03/24 0700   senna-docusate (Senokot-S) tablet 1 tablet, 1 tablet, Oral, QHS PRN, Bui, Xem M, MD   sodium chloride  0.9 % bolus 250 mL, 250 mL, Intravenous, PRN, Ray Coy, MD, Last Rate: 999 mL/hr at 05/03/24 0700, Infusion Verify at 05/03/24 0700  Labs: CBC Recent Labs    05/02/24  1851 05/02/24 1908 05/02/24 1956 05/02/24 2146  WBC 12.2*  --   --  9.4  HGB 16.2   < > 14.3 13.4  HCT 46.0   < > 42.0 38.3*  PLT 197  --   --  151   < > = values in this interval not displayed.   BMET Recent Labs    05/02/24 1851 05/02/24 1908 05/02/24 1956 05/02/24 2146  NA 135 137 135  --   K 4.2 4.1 3.8  --   CL 103 102  --   --   CO2 20*  --   --   --   GLUCOSE 123* 123*  --   --   BUN 17  19  --   --   CREATININE 1.03 1.00  --  0.87  CALCIUM  8.6*  --   --   --    LFT Recent Labs    05/02/24 1851  PROT 6.5  ALBUMIN 3.9  AST 23  ALT 11  ALKPHOS 57  BILITOT 1.3*   PT/INR No results for input(s): LABPROT, INR in the last 72 hours.  LOS: 1 day   I spent a total of 15 minutes in face to face in clinical consultation, greater than 50% of which was counseling/coordinating care.  RAY COY 05/03/2024 9:46 AM

## 2024-05-03 NOTE — Progress Notes (Addendum)
 STROKE TEAM PROGRESS NOTE    SIGNIFICANT HOSPITAL EVENTS  9/30: Presented after witnessed seizure by neighbor and EMS.   Taken for basilar thrombectomy, final TICI score 2B, with successful recanalization of acute basilar occlusion and residual occlusion left P1 segment  Admitted to ICU post procedure intubated and sedated  INTERIM HISTORY/SUBJECTIVE  STAT LTM EEG hooked up this morning. Awaiting AM labs.   Currently on propofol, fentanyl , Neo-Synephrine.  Blood pressure 130/83  CT Cspine negative, ok to remove cervical collar.   On exam, he has positive corneal, gag and cough, extension bue, withdraw ble. Bilateral babinski.   OBJECTIVE  CBC    Component Value Date/Time   WBC 9.4 05/02/2024 2146   RBC 4.11 (L) 05/02/2024 2146   HGB 13.4 05/02/2024 2146   HCT 38.3 (L) 05/02/2024 2146   PLT 151 05/02/2024 2146   MCV 93.2 05/02/2024 2146   MCH 32.6 05/02/2024 2146   MCHC 35.0 05/02/2024 2146   RDW 12.4 05/02/2024 2146   LYMPHSABS 1.0 05/02/2024 1851   MONOABS 0.5 05/02/2024 1851   EOSABS 0.0 05/02/2024 1851   BASOSABS 0.0 05/02/2024 1851    BMET    Component Value Date/Time   NA 135 05/02/2024 1956   NA 139 06/21/2020 1650   K 3.8 05/02/2024 1956   CL 102 05/02/2024 1908   CO2 20 (L) 05/02/2024 1851   GLUCOSE 123 (H) 05/02/2024 1908   BUN 19 05/02/2024 1908   BUN 13 06/21/2020 1650   CREATININE 0.87 05/02/2024 2146   CALCIUM  8.6 (L) 05/02/2024 1851   GFRNONAA >60 05/02/2024 2146    IMAGING past 24 hours Portable Chest x-ray Result Date: 05/03/2024 EXAM: 1 VIEW(S) XRAY OF THE CHEST 05/03/2024 12:21:00 AM COMPARISON: Same-day chest radiograph. CLINICAL HISTORY: Endotracheal tube present. FINDINGS: LINES, TUBES AND DEVICES: Endotracheal Tube tip in the intrathoracic trachea, 3.7 cm from the carina. Subdiaphragmatic enteric tube. LUNGS AND PLEURA: No focal pulmonary opacity. No pulmonary edema. No pleural effusion. No pneumothorax. HEART AND MEDIASTINUM: Stable  cardiomediastinal silhouette compared with same-day chest radiograph. BONES AND SOFT TISSUES: No acute osseous abnormality. IMPRESSION: 1. Endotracheal tube appropriately positioned in the intrathoracic trachea. 2. No acute cardiopulmonary process. Electronically signed by: Norman Gatlin MD 05/03/2024 12:30 AM EDT RP Workstation: [id number]   IR PERCUTANEOUS ART THROMBECTOMY/INFUSION INTRACRANIAL INC DIAG ANGIO Result Date: 05/02/2024 PROCEDURE PERFORMED: 1. Cerebral angiography with stroke thrombectomy 2. Ultrasound guided vascular access 3. Cone beam CT for treatment planning COMPARISON:  CT angiogram of the head and neck performed May 02, 2024 CLINICAL DATA:  73 year old male with acute ischemic stroke resulting in significant neurologic deficits requiring emergent intubation and life-support maneuvers. Preprocedure imaging demonstrated an acute basilar artery occlusion. Patient presents for endovascular therapy. INDICATION: Acute ischemic stroke. ANESTHESIA/SEDATION: General anesthesia was utilized for the procedure. CONTRAST:  Approximately 70 cc Ominipque 300 MEDICATIONS: See MAR FLUOROSCOPY TIME:  Fluoroscopy Time: 13 minutes 23 sec, (971 mGy). COMPLICATIONS: None immediate. BODY OF REPORT: Following a full explanation of the procedure along with the potential associated complications, an informed witnessed consent was obtained. The patient was then placed under general anesthesia by the Department of Anesthesiology at [facility]. The right femoral access site was prepped and draped in the usual sterile fashion. Ultrasound was used to study the right common femoral artery which was patent. Using real-time ultrasound guidance, a 19 gauge introducer needle was used to access the right common femoral artery. Access was performed at 21:54. A hard copy image ultrasound the saved and  stored in PACS. Using this access, a 6 French sheath was placed in the descending thoracic aorta. Next, selective  catheterization the left subclavian artery artery was performed. A selective arteriogram was performed which demonstrated the origin of the left vertebral artery which was patent. Next, a selective catheter was advanced into the left vertebral artery within the proximal segment. A repeat arteriogram was performed which demonstrated a left basilar occlusion within the mid segment. 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 Pretreatment TICI score 0. Next, a penumbra 43 and red 62 catheter was used to selectively catheterize the basilar artery. The first pass was performed at 22:13. This did not yield significant thrombus material and therefore a second pass was performed at 22:16. this yielded significant thrombus material and reconstitution of the basilar artery was achieved. After reviewing the imaging, it was apparent that there was slow flow within the left P2 segment. I was uncertain if this was related to acute embolus. Therefore, the penumbra 43 catheter was advanced into this artery and a single pass was performed at that site. This did not yield significant thrombus material. This pass was performed at 22:29. Evaluation of the right P1 segment also demonstrated disease at that level which was favored to be chronic. Post treatment TICI score 2b. After reviewing the imaging, I elected to terminate the procedure at this point. Evaluation of the right femoral access site demonstrated that the site was suitable for a closure device. A 6 French Angio-Seal device was deployed without complication. Cone beam CT was then performed to evaluate for intracranial hemorrhage and treatment planning. This demonstrated no evidence of significant acute intracranial hemorrhage. The patient was removed from anesthesia and transferred to recovery in stable condition. IMPRESSION: 1. Suction thrombectomy of basilar artery occlusion. 2. Post treatment TICI score 2b (based on age-indeterminate P2 disease) PLAN: 1. To  ICU for routine postoperative supportive care. Electronically Signed   By: Maude Naegeli M.D.   On: 05/02/2024 23:45   IR US  Guide Vasc Access Right Result Date: 05/02/2024 PROCEDURE PERFORMED: 1. Cerebral angiography with stroke thrombectomy 2. Ultrasound guided vascular access 3. Cone beam CT for treatment planning COMPARISON:  CT angiogram of the head and neck performed May 02, 2024 CLINICAL DATA:  73 year old male with acute ischemic stroke resulting in significant neurologic deficits requiring emergent intubation and life-support maneuvers. Preprocedure imaging demonstrated an acute basilar artery occlusion. Patient presents for endovascular therapy. INDICATION: Acute ischemic stroke. ANESTHESIA/SEDATION: General anesthesia was utilized for the procedure. CONTRAST:  Approximately 70 cc Ominipque 300 MEDICATIONS: See MAR FLUOROSCOPY TIME:  Fluoroscopy Time: 13 minutes 23 sec, (971 mGy). COMPLICATIONS: None immediate. BODY OF REPORT: Following a full explanation of the procedure along with the potential associated complications, an informed witnessed consent was obtained. The patient was then placed under general anesthesia by the Department of Anesthesiology at [facility]. The right femoral access site was prepped and draped in the usual sterile fashion. Ultrasound was used to study the right common femoral artery which was patent. Using real-time ultrasound guidance, a 19 gauge introducer needle was used to access the right common femoral artery. Access was performed at 21:54. A hard copy image ultrasound the saved and stored in PACS. Using this access, a 6 French sheath was placed in the descending thoracic aorta. Next, selective catheterization the left subclavian artery artery was performed. A selective arteriogram was performed which demonstrated the origin of the left vertebral artery which was patent. Next, a selective catheter was advanced into  the left vertebral artery within the  proximal segment. A repeat arteriogram was performed which demonstrated a left basilar occlusion within the mid segment. 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 Pretreatment TICI score 0. Next, a penumbra 43 and red 62 catheter was used to selectively catheterize the basilar artery. The first pass was performed at 22:13. This did not yield significant thrombus material and therefore a second pass was performed at 22:16. this yielded significant thrombus material and reconstitution of the basilar artery was achieved. After reviewing the imaging, it was apparent that there was slow flow within the left P2 segment. I was uncertain if this was related to acute embolus. Therefore, the penumbra 43 catheter was advanced into this artery and a single pass was performed at that site. This did not yield significant thrombus material. This pass was performed at 22:29. Evaluation of the right P1 segment also demonstrated disease at that level which was favored to be chronic. Post treatment TICI score 2b. After reviewing the imaging, I elected to terminate the procedure at this point. Evaluation of the right femoral access site demonstrated that the site was suitable for a closure device. A 6 French Angio-Seal device was deployed without complication. Cone beam CT was then performed to evaluate for intracranial hemorrhage and treatment planning. This demonstrated no evidence of significant acute intracranial hemorrhage. The patient was removed from anesthesia and transferred to recovery in stable condition. IMPRESSION: 1. Suction thrombectomy of basilar artery occlusion. 2. Post treatment TICI score 2b (based on age-indeterminate P2 disease) PLAN: 1. To ICU for routine postoperative supportive care. Electronically Signed   By: Maude Naegeli M.D.   On: 05/02/2024 23:45   CT HEAD WO CONTRAST ( ) Result Date: 05/02/2024 EXAM: CT HEAD WITHOUT CONTRAST 05/02/2024 11:06:41 PM TECHNIQUE: CT of the head was  performed without the administration of intravenous contrast. Automated exposure control, iterative reconstruction, and/or weight based adjustment of the mA/kV was utilized to reduce the radiation dose to as low as reasonably achievable. COMPARISON: CT head dated 05/02/2024. CLINICAL HISTORY: Stroke, follow up; post thrombectomy basilar artery. FINDINGS: BRAIN AND VENTRICLES: Chronic microvascular ischemia and generalized atrophy. 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 No acute hemorrhage. No evidence of acute infarct. No hydrocephalus. No extra-axial collection. No mass effect or midline shift. ORBITS: No acute abnormality. SINUSES: No acute abnormality. SOFT TISSUES AND SKULL: No acute soft tissue abnormality. No skull fracture. IMPRESSION: 1. No evidence of acute infarct. No intracranial hemorrhage. Electronically signed by: Norman Gatlin MD 05/02/2024 11:42 PM EDT RP Workstation: [id number]   CT ANGIO HEAD NECK W WO CM W PERF (CODE STROKE) Result Date: 05/02/2024 CLINICAL DATA:  Initial evaluation for acute neuro deficit, stroke. EXAM: CT ANGIOGRAPHY HEAD AND NECK CT PERFUSION BRAIN TECHNIQUE: Multidetector CT imaging of the head and neck was performed using the standard protocol during bolus administration of intravenous contrast. Multiplanar CT image reconstructions and MIPs were obtained to evaluate the vascular anatomy. Carotid stenosis measurements (when applicable) are obtained utilizing NASCET criteria, using the distal internal carotid diameter as the denominator. Multiphase CT imaging of the brain was performed following IV bolus contrast injection. Subsequent parametric perfusion maps were calculated using RAPID software. RADIATION DOSE REDUCTION: This exam was performed according to the departmental dose-optimization program which includes automated exposure control, adjustment of the mA and/or kV according to patient size and/or use of iterative reconstruction  technique. CONTRAST:  OMNIPAQUE  IOHEXOL  350 MG/ML SOLN COMPARISON:  Prior CT from earlier the  same day. FINDINGS: CTA NECK FINDINGS Aortic arch: Visualized aortic arch within normal limits for caliber with standard 3 vessel morphology. Aortic atherosclerosis. Right carotid system: Right common and internal carotid arteries are patent without dissection. Calcified plaque about the right carotid bulb without hemodynamically significant greater than 50% stenosis. Left carotid system: Left common and internal carotid arteries are patent without dissection. Atheromatous change about the left carotid bulb without hemodynamically significant greater than 50% stenosis. Vertebral arteries: Both vertebral arteries arise from subclavian arteries. Atheromatous plaque at the origin of the left vertebral artery with moderate stenosis (series 6, image 293). Left vertebral artery otherwise patent distally without stenosis or dissection. Right vertebral artery occluded at its origin, and remains largely occluded within the neck. Skeleton: No worrisome osseous lesions. Exaggeration of the normal cervical lordosis and visualized thoracic kyphosis. Other neck: No other acute finding. Upper chest: Mild dependent atelectatic changes noted within the visualized lung bases. 8 mm pulmonary nodule present at the left lung apex (series 8, image 155). Review of the MIP images confirms the above findings CTA HEAD FINDINGS Anterior circulation: Atheromatous change about the carotid siphons with no more than mild narrowing on the left, and mild to moderate narrowing at the para clinoid right ICA. A1 segments patent bilaterally. Normal anterior communicating complex. Anterior cerebral arteries patent without significant stenosis. Right M1 segment widely patent. Mild narrowing of the distal left M1 segment. No proximal M2 branch occlusion or high-grade stenosis. Distal MCA branches perfused and symmetric. Posterior circulation: Atheromatous  plaque about the mid left V4 segment with short-segment moderate stenosis (series 6, image 167). Left V4 segment becomes increasingly attenuated as it courses cephalad to the vertebrobasilar junction. Left PICA not seen. Right vertebral artery occluded at the skull base and remains occluded to the vertebrobasilar junction. Basilar patent proximally, but occludes just beyond the takeoff of the PICA as, consistent with acute basilar thrombosis (a series 6, image 128). Distal reconstitution at the basilar tip. Left SCA patent at its origin. Right SCA not well visualized, possibly occluded. Left PCA primarily supplied via the basilar, and occludes at the proximal left P2 segment (series 9, image 169). Predominant fetal type origin of the right PCA. Right PCA remains patent, although demonstrates moderate to severe multifocal atheromatous stenoses. Venous sinuses: Grossly patent allowing for timing the contrast bolus. Anatomic variants: As above.  No aneurysm. Review of the MIP images confirms the above findings CT Brain Perfusion Findings: CBF (<30%) Volume: 0mL Perfusion (Tmax>6.0s) volume: Mismatch Volume: Infarction Location:No acute core infarct by CT perfusion. Delayed perfusion seen throughout the posterior circulation, involving the cerebellum and left greater than right occipital regions, in keeping with the basilar thrombosis. IMPRESSION: 1. Positive CTA for acute basilar thrombosis. Distal reconstitution at the basilar tip. Right SCA not visualized, possibly occluded. Occlusion of the left PCA at the proximal left P2 segment. Predominant fetal type origin of the right PCA which remains patent, although demonstrates moderate to severe multifocal atheromatous stenoses. 2. Occlusion of the right vertebral artery at its origin, and remains occluded to the vertebrobasilar junction. 3. Moderate stenosis at the origin of the left vertebral artery, with moderate stenosis at the mid left V4 segment. 4.  Moderate atheromatous change about the carotid bulbs and carotid siphons, but no hemodynamically significant stenosis. 5. 8 mm left upper lobe pulmonary nodule, indeterminate. Per Fleischner Society Guidelines, recommend a non-contrast Chest CT at 6-12 months. If patient is high risk for malignancy, consider an additional non-contrast Chest CT at  18-24 months. If patient is low risk for malignancy, non-contrast Chest CT at 18-24 months is optional. These guidelines do not apply to immunocompromised patients and patients with cancer. Follow up in patients with significant comorbidities as clinically warranted. For lung cancer screening, adhere to Lung-RADS guidelines. Reference: Radiology. 2017; 284(1):228-43. Aortic Atherosclerosis (ICD10-I70.0). Critical Value/emergent results were called by telephone at the time of interpretation on 05/02/2024 at 8:49 pm to provider XEM BUI , who verbally acknowledged these results. Electronically Signed   By: Morene Hoard M.D.   On: 05/02/2024 21:08   DG Abdomen 1 View Result Date: 05/02/2024 EXAM: 1 VIEW XRAY OF THE ABDOMEN 05/02/2024 08:19:00 PM COMPARISON: None available. CLINICAL HISTORY: OG placement. Reason for exam: OG placement; Per triage notes: PT BIB [facility] unresponsive. PT LKW 1530 by wife. Found in back yard by bystander at 1738 per EMS call. PT vomited profusely and began seizing per bystander. EMS witnessed seizure activity as well. EMS gave 10 mg Versed  IM, and 4mg  Zofran  IV. 148/96 100% NRB, RR 14 ETC02 30 CBG 116; 18G in BL AC's. FINDINGS: Enteric tube tip and side port likely within the stomach. IMPRESSION: 1. Enteric tube tip and side port likely within the stomach. Electronically signed by: Norman Gatlin MD 05/02/2024 08:24 PM EDT RP Workstation: [id number]   CT Cervical Spine Wo Contrast Result Date: 05/02/2024 EXAM: CT CERVICAL SPINE WITHOUT CONTRAST 05/02/2024 07:19:00 PM TECHNIQUE: CT of the cervical spine was performed without the  administration of intravenous contrast. Multiplanar reformatted images are provided for review. Automated exposure control, iterative reconstruction, and/or weight based adjustment of the mA/kV was utilized to reduce the radiation dose to as low as reasonably achievable. COMPARISON: None available. CLINICAL HISTORY: Polytrauma, blunt. No contrast; CT Head Wo Contrast; Neuro deficit, acute, stroke suspected; CT Cervical Spine Wo Contrast; Polytrauma, blunt. FINDINGS: CERVICAL SPINE: BONES AND ALIGNMENT: Trace anterolisthesis of C7 on T1. Trace retrolisthesis of C3 on C4 and C5 on C6. No facet subluxation or dislocation. No acute fracture. DEGENERATIVE CHANGES: Vertebral disc space narrowing most pronounced at C3-C4, C5-C6, and C6-C7. Facet arthrosis and uncovertebral hypertrophy at multiple levels. Foraminal stenosis is most pronounced at C5-C6. No evidence of high grade osseous spinal canal stenosis. SOFT TISSUES: Partially visualized endotracheal tube. Biapical pleural parenchymal scarring. Patulous appearance of the upper thoracic esophagus. No prevertebral soft tissue swelling. IMPRESSION: 1. No acute abnormality of the cervical spine. Electronically signed by: Donnice Mania MD 05/02/2024 07:48 PM EDT RP Workstation: [id number]   DG Chest Port 1 View Result Date: 05/02/2024 CLINICAL DATA:  Altered mental status EXAM: PORTABLE CHEST 1 VIEW COMPARISON:  None Available. FINDINGS: Endotracheal tube tip is about 4.4 cm superior to the carina. No acute airspace disease, pleural effusion or pneumothorax. Normal cardiac size IMPRESSION: Endotracheal tube tip about 4.4 cm superior to the carina. No acute airspace disease. Electronically Signed   By: Luke Bun M.D.   On: 05/02/2024 19:45   CT Head Wo Contrast Result Date: 05/02/2024 EXAM: CT HEAD WITHOUT CONTRAST 05/02/2024 07:19:00 PM TECHNIQUE: CT of the head was performed without the administration of intravenous contrast. Automated exposure control, iterative  reconstruction, and/or weight based adjustment of the mA/kV was utilized to reduce the radiation dose to as low as reasonably achievable. COMPARISON: CT head 05/12/2014 CLINICAL HISTORY: Neuro deficit, acute, stroke suspected. Polytrauma, blunt. FINDINGS: BRAIN AND VENTRICLES: No acute hemorrhage. No evidence of acute infarct. Nonspecific hypoattenuation in the periventricular and subcortical white matter, most likely representing chronic microvascular ischemic changes. Chronic microvascular  changes and possible remote infarct in the right corona radiata extending into the basal ganglia. Mild parenchymal volume loss. Small remote infarct in the right cerebellum partially visualized. No hydrocephalus. No extra-axial collection. No mass effect or midline shift. Atherosclerosis of the carotid siphons and intracranial vertebral arteries. ORBITS: No acute abnormality. SINUSES: Scattered mucosal thickening throughout the ethmoid sinuses and right maxillary sinus. SOFT TISSUES AND SKULL: Partially visualized endotracheal tube. No acute soft tissue abnormality. No skull fracture. IMPRESSION: 1. No acute intracranial abnormality. 2. Chronic microvascular changes and possible remote infarct in the right corona radiata extending into the basal ganglia. 3. Small remote infarct in the right cerebellum. Electronically signed by: Donnice Mania MD 05/02/2024 07:41 PM EDT RP Workstation: [id number]    Vitals:   05/03/24 0630 05/03/24 0645 05/03/24 0700 05/03/24 0722  BP: 122/73 133/79 130/81 127/74  Pulse:   (!) 58 60  Resp:   16 16  Temp:   99.5 F (37.5 C)   TempSrc:      SpO2:   100% 100%  Weight:         PHYSICAL EXAM General:  Alert, well-nourished, well-developed patient in no acute distress Psych:  Mood and affect appropriate for situation CV: Regular rate and rhythm on monitor Respiratory:  Regular, unlabored respirations on room air GI: Abdomen soft and nontender   NEURO:  Mental Status:  Patient is  intubated and sedated.  He does not open eyes to voice or painful stimuli. Does not follow commands.  Cranial Nerves:  II: PERRL.  Inconsistent blink to threat bilaterally. III, IV, VI: With passive eye raise, conjugate gaze V, VII: ETT in place. Corneal reflex intact IX, X: Cough and gag reflexes intact Motor/sensory:  No spontaneous or to command movement seen in any extremity. Extensor posturing seen in bilateral upper extremities, slight withdrawal seen bilateral lower extremities. Tone: is normal and bulk is normal   Coordination: Unable to assess Gait- deferred  Most Recent NIH 30    ASSESSMENT/PLAN  Gregory Shields is a 73 y.o. 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 Now s/p TICI 2B. Admitted for ICU monitoring and stroke workup. NIH on Admission: 28.  Stroke: posterior circulation infarcts with BA occlusion s/p IR with TICI 2B, etiology likely Afib not on AC  CT head No acute abnormality.  Possible remote infarct right corona radiata, extending into basal ganglia.  Small remote infarct right cerebellum. CTA head & neck with perfusion Positive CTA for acute basilar thrombosis.  Right SCA possibly occluded as it is not visualized.  Occlusion of left PCA at proximal left P2 segment.  Occlusion of right vertebral artery at origin, remains occluded at vertebral basilar junction. State Post IR with TICI2b  Post IR CT head No evidence of acute infarct or intracranial hemorrhage MRI: PENDING MRA   PENDING 2D Echo EF 40-45% LDL 124 HgbA1c 4.5 UDS + THC  VTE prophylaxis - Lovenox aspirin  325 mg daily Eliquis  (noncompliant) prior to admission, now on aspirin  325 mg daily.  Will decide on further regimen based on MRI Therapy recommendations:  Pending Disposition:  pending  Acute respiratory failure, postprocedure kept  intubated CCM management, appreciate assistance Daily SBT, VAP Okay to keep intubated due to MRI brain and MRA this afternoon.   Plan for weaning sedation and SBT starting tomorrow morning.  Seizures vs. Posturing  Status post  Keppra 4.25 g load 9/30 Continue Keppra maintenance dose of 1 g twice daily on LTM EEG On propofol  Atrial fibrillation Noted in multiple prior notes is not taking eliquis  due to patient choice. Hx of LV thrombus in 2015 Outpatient cardiology appointment 2021 notes that patient stopped taking his Eliquis  approximately a year ago and is only taking aspirin . 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 Started on Eliquis  at this time as well as aspirin  and Brilinta . Outpatient cardiology appointment 2021 notes that patient stopped taking his Eliquis  approximately a year ago and is only taking aspirin .  Hypertension Home meds: None Unstable, requiring pressors Phenylephrine, wean as tolerated Blood pressure goal 120-160, per IR  Hyperlipidemia Home meds: None LDL 124, goal < 70 Add Lipitor  40 mg Continue statin at discharge  Dysphagia Patient has post-stroke dysphagia, SLP consulted On Continue IVF, increased to 75ml/hr May consider TF once appropriate  Other Stroke Risk Factors Advanced age greater than/equal to 47 CAD/MI with hx of LV thrombus Obstructive sleep apnea, not on CPAP at home Cardiomyopathy with EF 40-45%  Other Active Problems Leukocytosis 12.2--9.4  Hospital day # 1   Pt seen by Neuro NP/APP and later by MD. Note/plan to be edited by MD as needed.    Gregory JAYSON Likes, DNP [facility] Please use AMION for contact information & EPIC for messaging.  ATTENDING NOTE: I reviewed above note and agree with the assessment and plan. Pt was seen and examined.    No family at the bedside. Pt RN is at the bedside. Pt still intubated, on vent, on sedation, not responsive, eyes closed, not following commands. With forced eye opening, eyes in mid position, not blinking to visual threat, doll's eyes absent, not tracking, pupil bilaterally 2 mm, not quite reactive to light. Corneal reflex absent bilaterally, gag and cough present. Breathing over the vent.  Facial symmetry not able to test due to ET tube.  Tongue protrusion not cooperative. On pain stimulation, bilateral upper extremity extension posturing.  Bilateral lower extremity mild withdrawal.  Bilateral positive babinski. Sensation, coordination and gait not tested.  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  Pending MRI and MRA.  Currently on aspirin  325, add Lipitor  40.  Vent management per CCM.  Presented with concerning for seizure but likely just posturing due to bilaterally occlusion.  Now on long-term EEG, on Keppra, will continue.  Discussed with CCM Dr. Gatha.   For detailed assessment and plan, please refer to above as I have made changes wherever appropriate.   Gregory Cummins, MD PhD Stroke Neurology 05/03/2024 5:02 PM  This patient is critically ill due to basilar artery occlusion, brainstem stroke status post thrombectomy, bilateral posturing A-fib not on AC, respiratory failure and at significant risk of neurological worsening, death form recurrent stroke, hemorrhagic transformation, heart failure, renal failure, seizure. This patient's care requires constant monitoring of vital signs, hemodynamics, respiratory and cardiac monitoring, review of multiple databases, neurological assessment, discussion with family, other specialists and medical decision making of high complexity. I spent 45 minutes of neurocritical care time in the care of this patient. I had long discussion with wife over the phone, updated pt current condition, treatment plan and potential  prognosis, and answered all the questions.  She expressed understanding and appreciation.  To contact Stroke Continuity provider, please refer to [email or web address]. After hours, contact General Neurology

## 2024-05-03 NOTE — Progress Notes (Addendum)
 NAME:  Gregory Shields, MRN:  [id number], DOB:  [birth year], LOS: 1 ADMISSION DATE:  05/02/2024, CONSULTATION DATE: 9/30 REFERRING MD: Dr. Sallyann, CHIEF COMPLAINT: Stroke  History of Present Illness:  73 year old male with past medical history as below, which is significant for coronary artery disease, depression, hypertension, sleep apnea, and CVA.  He was in his usual state of health until 9/30. LKW 1530. Found unresponsive in his yard some time later.  There was some concern for seizure-like activity during EMS evaluation and he was administered Versed .  Upon arrival to the emergency department [phone] he was intubated for airway protection.  CT scan showed no acute hemorrhage.  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  He the patient was outside the window for TNK but was taken to IR where he underwent thrombectomy.  Postoperatively he was transferred to the ICU on the mechanical ventilator.  PCCM was consulted.  Pertinent  Medical History   has a past medical history of Coronary artery disease, Depression, Hypertension, Myocardial infarction [facility]), Sleep apnea, and Stroke (HCC).   Significant Hospital Events: Including procedures, antibiotic start and stop dates in addition to other pertinent events   9/30 presented with basilar artery occlusion and underwent mechanical thrombectomy.  Intubated  Interim History / Subjective:    Objective    Blood pressure 128/75, pulse 67, temperature 99 F (37.2 C), resp. rate 16, height 5' 8 (1.727 m), weight 75 kg, SpO2 100%.    Vent Mode: PRVC FiO2 (%):  [40 %-100 %] 40 % Set Rate:  [16 bmp] 16 bmp Vt Set:  [500 mL-530 mL] 530 mL PEEP:  [5 cmH20] 5 cmH20 Plateau Pressure:  [12 cmH20-13 cmH20] 13 cmH20   Intake/Output Summary (Last 24 hours) at 05/03/2024 1528 Last data filed at 05/03/2024 1400 Gross per 24 hour  Intake 2908.38 ml  Output 2125 ml  Net 783.38 ml   Filed  Weights   05/02/24 2318  Weight: 75 kg    Examination: General: Elderly appearing male in no acute distress on mechanical ventilator HENT: Normocephalic, cervical collar in place, pupils 3 mm and equal Lungs: Clear bilateral breath sounds Cardiovascular: Irregularly irregular, rate controlled Abdomen: Soft, nondistended, hernia left groin Extremities: No acute deformity pedal pulses intact Neuro: Sedated GU: Foley  Resolved problem list   Assessment and Plan   Basilar artery occlusion: outside lytic window. S/p thrombectomy 9/30. Some residual occlusion per charting.  - Keep SBP 120-160 - Clevi/IVF/Neofor BP goal - Management per stroke service - CBG monitoring - Echo -shows EF of 40 to 45% and no LV thrombus. - MRI brain showed acute infarcts in the right side of pons left side of thalamus and bilateral cerebellar hemispheres.  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  Occlusion of posterior cerebral artery.  Atrial fib: had not been taking Eliquis  - Hold AC for now. Restart when OK by neuro - Telemetry monitoring.   Acute respiratory failure with hypoxia secondary to unresponsiveness - Full vent support - CXR and ABG - VAP bundle - Prop and fentanyl  for RASS 0 to -1. -Wean off sedation during SAT and SBT.  Possible seizure - Versed , Keppra given initially - Hold further tx for now - EEG report pending.  OSA - uncertain if on CPAP - supportive care   Labs   CBC: Recent Labs  Lab  05/02/24 1851 05/02/24 1908 05/02/24 1956 05/02/24 2146 05/03/24 1150  WBC 12.2*  --   --  9.4 12.3*  NEUTROABS 10.5*  --   --   --   --   HGB 16.2 15.6 14.3 13.4 15.1  HCT 46.0 46.0 42.0 38.3* 43.6  MCV 92.2  --   --  93.2 94.8  PLT 197  --   --  151 184    Basic Metabolic Panel: Recent Labs  Lab 05/02/24 1851 05/02/24 1908 05/02/24 1951 05/02/24 1956 05/02/24 2146  05/03/24 1150  NA 135 137  --  135  --  136  K 4.2 4.1  --  3.8  --  3.8  CL 103 102  --   --   --  106  CO2 20*  --   --   --   --  19*  GLUCOSE 123* 123*  --   --   --  91  BUN 17 19  --   --   --  12  CREATININE 1.03 1.00  --   --  0.87 1.02  CALCIUM  8.6*  --   --   --   --  8.2*  MG  --   --  1.9  --   --  1.9   GFR: Estimated Creatinine Clearance: 65.2 mL/min (by C-G formula based on SCr of 1.02 mg/dL). Recent Labs  Lab 05/02/24 1851 05/02/24 1908 05/02/24 2146 05/03/24 1150  WBC 12.2*  --  9.4 12.3*  LATICACIDVEN  --  1.9  --   --     Liver Function Tests: Recent Labs  Lab 05/02/24 1851  AST 23  ALT 11  ALKPHOS 57  BILITOT 1.3*  PROT 6.5  ALBUMIN 3.9   No results for input(s): LIPASE, AMYLASE in the last 168 hours. No results for input(s): AMMONIA in the last 168 hours.  ABG    Component Value Date/Time   PHART 7.350 05/02/2024 1956   PCO2ART 43.3 05/02/2024 1956   PO2ART 531 (H) 05/02/2024 1956   HCO3 23.9 05/02/2024 1956   TCO2 25 05/02/2024 1956   ACIDBASEDEF 2.0 05/02/2024 1956   O2SAT 100 05/02/2024 1956     Coagulation Profile: No results for input(s): INR, PROTIME in the last 168 hours.  Cardiac Enzymes: No results for input(s): CKTOTAL, CKMB, CKMBINDEX, TROPONINI in the last 168 hours.  HbA1C: Hgb A1c MFr Bld  Date/Time Value Ref Range Status  05/02/2024 09:46 PM 4.5 (L) 4.8 - 5.6 % Final    Comment:    (NOTE) Diagnosis of Diabetes The following HbA1c ranges recommended by the American Diabetes Association (ADA) may be used as an aid in the diagnosis of diabetes mellitus.  Hemoglobin             Suggested A1C NGSP%              Diagnosis  <5.7                   Non Diabetic  5.7-6.4                Pre-Diabetic  >6.4                   Diabetic  <7.0                   Glycemic control for  adults with diabetes.    05/14/2014 03:08 AM 5.1 <5.7 % Final    Comment:    (NOTE)                                                                        According to the ADA Clinical Practice Recommendations for 2011, when HbA1c is used as a screening test:  >=6.5%   Diagnostic of Diabetes Mellitus           (if abnormal result is confirmed) 5.7-6.4%   Increased risk of developing Diabetes Mellitus References:Diagnosis and Classification of Diabetes Mellitus,Diabetes Care,2011,34(Suppl 1):S62-S69 and Standards of Medical Care in         Diabetes - 2011,Diabetes Care,2011,34 (Suppl 1):S11-S61.    CBG: Recent Labs  Lab 05/02/24 1848 05/02/24 2346 05/03/24 0320 05/03/24 0750 05/03/24 1138  GLUCAP 112* 105* 95 82 84    Review of Systems:   Patient is encephalopathic and/or intubated; therefore, history has been obtained from chart review.    Past Medical History:  He,  has a past medical history of Coronary artery disease, Depression, Hypertension, Myocardial infarction [facility]), Sleep apnea, and Stroke (HCC).   Surgical History:   Past Surgical History:  Procedure Laterality Date   IR PERCUTANEOUS ART THROMBECTOMY/INFUSION INTRACRANIAL INC DIAG ANGIO  05/02/2024   IR US  GUIDE VASC ACCESS RIGHT  05/02/2024   LEFT HEART CATHETERIZATION WITH CORONARY ANGIOGRAM N/A 05/12/2014   Procedure: LEFT HEART CATHETERIZATION WITH CORONARY ANGIOGRAM;  Surgeon: Rober LOISE Chroman, MD;  Location: [facility] CATH LAB;  Service: Cardiovascular;  Laterality: N/A;   RADIOLOGY WITH ANESTHESIA N/A 05/02/2024   Procedure: RADIOLOGY WITH ANESTHESIA;  Surgeon: Dolphus Carrion, MD;  Location: [facility] OR;  Service: Radiology;  Laterality: N/A;     Social History:   reports that he has never smoked. He has never used smokeless tobacco. He reports current alcohol use. He reports that he does not use drugs.   Family History:  His family history includes Heart attack in his father.   Allergies Allergies  Allergen Reactions   Other Itching and Other (See Comments)    Pollen- Itchy eyes, sneezing, runny nose, etc..    Penicillins Other (See Comments)    Doesn't remember exact allergic reaction     Home Medications  Prior to Admission medications   Medication Sig Start Date End Date Taking? Authorizing Provider  aspirin  EC 325 MG tablet Take 162.5 mg by mouth daily as needed (for headaches).   Yes [provider]  B Complex Vitamins (VITAMIN B COMPLEX) TABS Take 1 tablet by mouth daily with breakfast.   Yes [provider]  Cholecalciferol (VITAMIN D3 PO) Take 1 capsule by mouth daily.   Yes [provider]  clobetasol cream (TEMOVATE) 0.05 % Apply 1 Application topically 2 (two) times daily. 04/19/24  Yes [provider]  Multiple Vitamins-Minerals (MULTIVITAMIN MEN) TABS Take 1 tablet by mouth daily with breakfast.   Yes [provider]  Omega-3 Fatty Acids (FISH OIL PO) Take 1 capsule by mouth daily.   Yes [provider]     Critical care time: 12 min      Tamela Stakes, MD  Attending Physician, Critical Care Medicine [place] Pulmonary Critical Care See Amion  for pager If no response to pager, please call [phone] until 7pm After 7pm, Please call E-link [phone]  05/03/2024 3:28 PM

## 2024-05-03 DEATH — deceased

## 2024-05-04 ENCOUNTER — Inpatient Hospital Stay (HOSPITAL_COMMUNITY)

## 2024-05-04 DIAGNOSIS — R29721 NIHSS score 21: Secondary | ICD-10-CM

## 2024-05-04 DIAGNOSIS — R569 Unspecified convulsions: Secondary | ICD-10-CM | POA: Diagnosis not present

## 2024-05-04 DIAGNOSIS — I6302 Cerebral infarction due to thrombosis of basilar artery: Secondary | ICD-10-CM | POA: Diagnosis not present

## 2024-05-04 DIAGNOSIS — J9601 Acute respiratory failure with hypoxia: Secondary | ICD-10-CM | POA: Diagnosis not present

## 2024-05-04 DIAGNOSIS — I4891 Unspecified atrial fibrillation: Secondary | ICD-10-CM | POA: Diagnosis not present

## 2024-05-04 LAB — BASIC METABOLIC PANEL WITH GFR
Anion gap: 7 (ref 5–15)
BUN: 10 mg/dL (ref 8–23)
CO2: 22 mmol/L (ref 22–32)
Calcium: 7.9 mg/dL — ABNORMAL LOW (ref 8.9–10.3)
Chloride: 107 mmol/L (ref 98–111)
Creatinine, Ser: 0.99 mg/dL (ref 0.61–1.24)
GFR, Estimated: >60 mL/min (ref >60)
Glucose, Bld: 92 mg/dL (ref 70–99)
Potassium: 3.6 mmol/L (ref 3.5–5.1)
Sodium: 136 mmol/L (ref 135–145)

## 2024-05-04 LAB — GLUCOSE, CAPILLARY
Glucose-Capillary: 100 mg/dL — ABNORMAL HIGH (ref 70–99)
Glucose-Capillary: 116 mg/dL — ABNORMAL HIGH (ref 70–99)
Glucose-Capillary: 109 mg/dL — ABNORMAL HIGH (ref 70–99)
Glucose-Capillary: 131 mg/dL — ABNORMAL HIGH (ref 70–99)
Glucose-Capillary: 131 mg/dL — ABNORMAL HIGH (ref 70–99)
Glucose-Capillary: 116 mg/dL — ABNORMAL HIGH (ref 70–99)

## 2024-05-04 LAB — CBC
HCT: 42.1 % (ref 39.0–52.0)
Hemoglobin: 14.5 g/dL (ref 13.0–17.0)
MCH: 32.3 pg (ref 26.0–34.0)
MCHC: 34.4 g/dL (ref 30.0–36.0)
MCV: 93.8 fL (ref 80.0–100.0)
Platelets: 162 K/uL (ref 150–400)
RBC: 4.49 MIL/uL (ref 4.22–5.81)
RDW: 12.8 % (ref 11.5–15.5)
WBC: 10.8 K/uL — ABNORMAL HIGH (ref 4.0–10.5)
nRBC: 0 % (ref 0.0–0.2)

## 2024-05-04 LAB — PHOSPHORUS: Phosphorus: 2.5 mg/dL (ref 2.5–4.6)

## 2024-05-04 LAB — MAGNESIUM: Magnesium: 1.9 mg/dL (ref 1.7–2.4)

## 2024-05-04 MED ORDER — OSMOLITE 1.5 CAL PO LIQD
1000.0000 mL | ORAL | Status: DC
  Administered 2024-05-04 – 2024-05-12 (×5): 1000 mL

## 2024-05-04 MED ORDER — ASPIRIN 81 MG PO CHEW
81.0000 mg | CHEWABLE_TABLET | Freq: Every day | ORAL | Status: DC
  Administered 2024-05-05: 81 mg
  Filled 2024-05-04: qty 1

## 2024-05-04 MED ORDER — PROSOURCE TF20 ENFIT COMPATIBL EN LIQD
60.0000 mL | Freq: Every day | ENTERAL | Status: DC
  Administered 2024-05-04 – 2024-05-14 (×11): 60 mL
  Filled 2024-05-04 (×11): qty 60

## 2024-05-04 MED ORDER — POTASSIUM CHLORIDE 20 MEQ PO PACK
40.0000 meq | PACK | Freq: Once | ORAL | Status: AC
  Administered 2024-05-04: 40 meq
  Filled 2024-05-04: qty 2

## 2024-05-04 MED ORDER — THIAMINE MONONITRATE 100 MG PO TABS
100.0000 mg | ORAL_TABLET | Freq: Every day | ORAL | Status: AC
  Administered 2024-05-04 – 2024-05-10 (×7): 100 mg
  Filled 2024-05-04 (×7): qty 1

## 2024-05-04 NOTE — Progress Notes (Signed)
 SLP Cancellation Note  Patient Details Name: Gregory Shields MRN: [id number] DOB: [birth year]   Cancelled treatment:       Reason Eval/Treat Not Completed: Patient not medically ready. New orders received for swallowing but pt remains intubated. SLP will f/u as able.    Damien Blumenthal, M.A., CCC-SLP Speech Language Pathology, Acute Rehabilitation Services  Secure Chat preferred [phone]  05/04/2024, 12:15 PM

## 2024-05-04 NOTE — Progress Notes (Signed)
 NAME:  Gregory Shields, MRN:  [id number], DOB:  May 02, 1951, LOS: 2 ADMISSION DATE:  05/02/2024, CONSULTATION DATE: 9/30 REFERRING MD: Dr. Sallyann, CHIEF COMPLAINT: Stroke  History of Present Illness:  73 year old male with past medical history as below, which is significant for coronary artery disease, depression, hypertension, sleep apnea, and CVA.  He was in his usual state of health until 9/30. LKW 1530. Found unresponsive in his yard some time later.  There was some concern for seizure-like activity during EMS evaluation and he was administered Versed .  Upon arrival to the emergency department [phone] he was intubated for airway protection.  CT scan showed no acute hemorrhage.  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  He the patient was outside the window for TNK but was taken to IR where he underwent thrombectomy.  Postoperatively he was transferred to the ICU on the mechanical ventilator.  PCCM was consulted.  Pertinent  Medical History   has a past medical history of Coronary artery disease, Depression, Hypertension, Myocardial infarction [facility]), Sleep apnea, and Stroke (HCC).   Significant Hospital Events: Including procedures, antibiotic start and stop dates in addition to other pertinent events   9/30 presented with basilar artery occlusion and underwent mechanical thrombectomy.  Intubated 10/2 -not able to follow commands despite sedation being completely turned down.  Tolerating pressure support ventilation but airway is not patent.  Start tube feeds  Interim History / Subjective:  Patient is not waking up despite sedation being off.  He is not following commands not moving even involuntarily.  He is able to tolerate pressure support ventilation.  Airway is not patent.  Objective    Blood pressure (!) 145/79, pulse 86, temperature 98.4 F (36.9 C), resp. rate 14, height 5' 8 (1.727 m), weight 75 kg, SpO2 100%.     Vent Mode: CPAP;PSV FiO2 (%):  [40 %] 40 % Set Rate:  [16 bmp] 16 bmp Vt Set:  [530 mL-540 mL] 530 mL PEEP:  [5 cmH20] 5 cmH20 Pressure Support:  [10 cmH20] 10 cmH20 Plateau Pressure:  [12 cmH20-18 cmH20] 18 cmH20   Intake/Output Summary (Last 24 hours) at 05/04/2024 1256 Last data filed at 05/04/2024 1200 Gross per 24 hour  Intake 2107.06 ml  Output 1090 ml  Net 1017.06 ml   Filed Weights   05/02/24 2318  Weight: 75 kg    Examination: General: Elderly appearing male in no acute distress on mechanical ventilator HENT: Normocephalic, c-collar is cleared pupils 3 mm and equal Lungs: Clear bilateral breath sounds Cardiovascular: Irregularly irregular, rate controlled Abdomen: Soft, nondistended, hernia left groin Extremities: No acute deformity pedal pulses intact Neuro: Sedated GU: Foley  Resolved problem list   Assessment and Plan   Basilar artery occlusion: outside lytic window. S/p thrombectomy 9/30. Some residual occlusion per charting.  - Keep SBP 120-160 - Clevi/IVF/Neofor BP goal - Management per stroke service - CBG monitoring - Echo -shows EF of 40 to 45% and no LV thrombus. - MRI brain showed acute infarcts in the right side of pons left side of thalamus and bilateral cerebellar hemispheres.  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  Occlusion of posterior cerebral artery. - Tube feeds will be started today.  Atrial fib: had not been taking Eliquis  - Hold AC for now as he has a pontine hemorrhage. Restart when OK by neuro -  Telemetry monitoring.   Acute respiratory failure with hypoxia secondary to unresponsiveness - To remain intubated on pressure support ventilation - VAP bundle - Wean off sedation use as needed  Possible seizure - Versed , Keppra given initially - Hold further tx for now - EEG repor suggestive of diffuse encephalopathy no seizures or  epileptiform activity noted  OSA - uncertain if on CPAP - supportive care  There is no family in the room today.  Dr. Jerri discussed goals of care with the wife yesterday and apparently she does not want trach and PEG.  At this stage wife wants to have another discussion with neurology team today.  Will await for that keep the patient intubated.  We started tube feeds.   Labs   CBC: Recent Labs  Lab 05/02/24 1851 05/02/24 1908 05/02/24 1956 05/02/24 2146 05/03/24 1150 05/04/24 0539  WBC 12.2*  --   --  9.4 12.3* 10.8*  NEUTROABS 10.5*  --   --   --   --   --   HGB 16.2 15.6 14.3 13.4 15.1 14.5  HCT 46.0 46.0 42.0 38.3* 43.6 42.1  MCV 92.2  --   --  93.2 94.8 93.8  PLT 197  --   --  151 184 162    Basic Metabolic Panel: Recent Labs  Lab 05/02/24 1851 05/02/24 1908 05/02/24 1951 05/02/24 1956 05/02/24 2146 05/03/24 1150 05/04/24 0539  NA 135 137  --  135  --  136 136  K 4.2 4.1  --  3.8  --  3.8 3.6  CL 103 102  --   --   --  106 107  CO2 20*  --   --   --   --  19* 22  GLUCOSE 123* 123*  --   --   --  91 92  BUN 17 19  --   --   --  12 10  CREATININE 1.03 1.00  --   --  0.87 1.02 0.99  CALCIUM  8.6*  --   --   --   --  8.2* 7.9*  MG  --   --  1.9  --   --  1.9  --    GFR: Estimated Creatinine Clearance: 67.2 mL/min (by C-G formula based on SCr of 0.99 mg/dL). Recent Labs  Lab 05/02/24 1851 05/02/24 1908 05/02/24 2146 05/03/24 1150 05/04/24 0539  WBC 12.2*  --  9.4 12.3* 10.8*  LATICACIDVEN  --  1.9  --   --   --     Liver Function Tests: Recent Labs  Lab 05/02/24 1851  AST 23  ALT 11  ALKPHOS 57  BILITOT 1.3*  PROT 6.5  ALBUMIN 3.9   No results for input(s): LIPASE, AMYLASE in the last 168 hours. No results for input(s): AMMONIA in the last 168 hours.  ABG    Component Value Date/Time   PHART 7.350 05/02/2024 1956   PCO2ART 43.3 05/02/2024 1956   PO2ART 531 (H) 05/02/2024 1956   HCO3 23.9 05/02/2024 1956   TCO2 25 05/02/2024 1956    ACIDBASEDEF 2.0 05/02/2024 1956   O2SAT 100 05/02/2024 1956     Coagulation Profile: No results for input(s): INR, PROTIME in the last 168 hours.  Cardiac Enzymes: No results for input(s): CKTOTAL, CKMB, CKMBINDEX, TROPONINI in the last 168 hours.  HbA1C: Hgb A1c MFr Bld  Date/Time Value Ref Range Status  05/02/2024 09:46 PM 4.5 (L) 4.8 - 5.6 % Final    Comment:    (  NOTE) Diagnosis of Diabetes The following HbA1c ranges recommended by the American Diabetes Association (ADA) may be used as an aid in the diagnosis of diabetes mellitus.  Hemoglobin             Suggested A1C NGSP%              Diagnosis  <5.7                   Non Diabetic  5.7-6.4                Pre-Diabetic  >6.4                   Diabetic  <7.0                   Glycemic control for                       adults with diabetes.    05/14/2014 03:08 AM 5.1 <5.7 % Final    Comment:    (NOTE)                                                                       According to the ADA Clinical Practice Recommendations for 2011, when HbA1c is used as a screening test:  >=6.5%   Diagnostic of Diabetes Mellitus           (if abnormal result is confirmed) 5.7-6.4%   Increased risk of developing Diabetes Mellitus References:Diagnosis and Classification of Diabetes Mellitus,Diabetes Care,2011,34(Suppl 1):S62-S69 and Standards of Medical Care in         Diabetes - 2011,Diabetes Care,2011,34 (Suppl 1):S11-S61.    CBG: Recent Labs  Lab 05/03/24 1918 05/03/24 2322 05/04/24 0337 05/04/24 0752 05/04/24 1152  GLUCAP 94 122* 100* 116* 109*    Review of Systems:   Patient is encephalopathic and/or intubated; therefore, history has been obtained from chart review.    Past Medical History:  He,  has a past medical history of Coronary artery disease, Depression, Hypertension, Myocardial infarction [facility] Sleep apnea, and Stroke (HCC).   Surgical History:   Past Surgical History:  Procedure  Laterality Date   IR PERCUTANEOUS ART THROMBECTOMY/INFUSION INTRACRANIAL INC DIAG ANGIO  05/02/2024   IR US  GUIDE VASC ACCESS RIGHT  05/02/2024   LEFT HEART CATHETERIZATION WITH CORONARY ANGIOGRAM N/A 05/12/2014   Procedure: LEFT HEART CATHETERIZATION WITH CORONARY ANGIOGRAM;  Surgeon: Rober LOISE Chroman, MD;  Location: [facility] CATH LAB;  Service: Cardiovascular;  Laterality: N/A;   RADIOLOGY WITH ANESTHESIA N/A 05/02/2024   Procedure: RADIOLOGY WITH ANESTHESIA;  Surgeon: Dolphus Carrion, MD;  Location: [facility] OR;  Service: Radiology;  Laterality: N/A;     Social History:   reports that he has never smoked. He has never used smokeless tobacco. He reports current alcohol use. He reports that he does not use drugs.   Family History:  His family history includes Heart attack in his father.   Allergies Allergies  Allergen Reactions   Other Itching and Other (See Comments)    Pollen- Itchy eyes, sneezing, runny nose, etc..   Penicillins Other (See Comments)    Doesn't remember exact allergic reaction     Home Medications  Prior  to Admission medications   Medication Sig Start Date End Date Taking? Authorizing Provider  aspirin  EC 325 MG tablet Take 162.5 mg by mouth daily as needed (for headaches).   Yes [provider]  B Complex Vitamins (VITAMIN B COMPLEX) TABS Take 1 tablet by mouth daily with breakfast.   Yes [provider]  Cholecalciferol (VITAMIN D3 PO) Take 1 capsule by mouth daily.   Yes [provider]  clobetasol cream (TEMOVATE) 0.05 % Apply 1 Application topically 2 (two) times daily. 04/19/24  Yes [provider]  Multiple Vitamins-Minerals (MULTIVITAMIN MEN) TABS Take 1 tablet by mouth daily with breakfast.   Yes [provider]  Omega-3 Fatty Acids (FISH OIL PO) Take 1 capsule by mouth daily.   Yes [provider]     Critical care time: 86 min      Tamela Stakes, MD  Attending Physician, Critical Care Medicine [place]  Pulmonary Critical Care See Amion for pager If no response to pager, please call [phone] until 7pm After 7pm, Please call E-link [phone]  05/04/2024 12:56 PM

## 2024-05-04 NOTE — Progress Notes (Signed)
 LTM maint complete - no skin breakdown under: FP1,FP2,P7. [facility] is able to monitor study. Connection issues through the night

## 2024-05-04 NOTE — Progress Notes (Signed)
 Notified MD Mohammed regarding Pt decreased urine output and bladder scan of 106 mL. MD placed new orders to increase NS fluids. See new orders, monitoring closely.  1850 - Urine output grossly intact. Pt had 1 large unmeasured urine output.

## 2024-05-04 NOTE — TOC CAGE-AID Note (Signed)
 Transition of Care [facility]) - CAGE-AID Screening   Patient Details  Name: Gregory Shields MRN: [id number] Date of Birth: 06/10/1951  [facility] of [facility]) CM/SW Contact:    Inocente GORMAN Kindle, LCSW Phone Number: 05/04/2024, 10:00 AM   Clinical Narrative: Patient intubated and unable to participate in screening.    CAGE-AID Screening: Substance Abuse Screening unable to be completed due to: : Patient unable to participate

## 2024-05-04 NOTE — Progress Notes (Signed)
 LTM EEG discontinued - no skin breakdown at [facility].

## 2024-05-04 NOTE — Progress Notes (Addendum)
 STROKE TEAM PROGRESS NOTE    SIGNIFICANT HOSPITAL EVENTS  9/30: Presented after witnessed seizure by neighbor and EMS.   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  INTERIM HISTORY/SUBJECTIVE  RN at bedside. No family at bedside. Wife was updated over the phone yesterday afternoon.   On exam today, patient does not follow commands.  He has spontaneous movement of right leg only.  Has extensor posturing with noxious stimuli.  Positive cough gag and corneals.  Patient is not following commands and does not seem that he would be able to protect his airway secondary to stroke, neurological exam precludes extubation. Agree with starting tube feeds via NG today or CorTrak tomorrow.   LTM EEG negative for seizures, will discontinue.  Continue Keppra. Will continue to hold [facility] due to pontine hemorrhage seen on MRI.    OBJECTIVE  CBC    Component Value Date/Time   WBC 10.8 (H) 05/04/2024 0539   RBC 4.49 05/04/2024 0539   HGB 14.5 05/04/2024 0539   HCT 42.1 05/04/2024 0539   PLT 162 05/04/2024 0539   MCV 93.8 05/04/2024 0539   MCH 32.3 05/04/2024 0539   MCHC 34.4 05/04/2024 0539   RDW 12.8 05/04/2024 0539   LYMPHSABS 1.0 05/02/2024 1851   MONOABS 0.5 05/02/2024 1851   EOSABS 0.0 05/02/2024 1851   BASOSABS 0.0 05/02/2024 1851    BMET    Component Value Date/Time   NA 136 05/04/2024 0539   NA 139 06/21/2020 1650   K 3.6 05/04/2024 0539   CL 107 05/04/2024 0539   CO2 22 05/04/2024 0539   GLUCOSE 92 05/04/2024 0539   BUN 10 05/04/2024 0539   BUN 13 06/21/2020 1650   CREATININE 0.99 05/04/2024 0539   CALCIUM  7.9 (L) 05/04/2024 0539   GFRNONAA >60 05/04/2024 0539    IMAGING past 24 hours MR ANGIO HEAD WO CONTRAST Result Date: 05/03/2024 CLINICAL DATA:   Acute infarcts EXAM: MRA HEAD WITHOUT CONTRAST TECHNIQUE: Angiographic images of the Circle of Willis were acquired using MRA technique without intravenous contrast. COMPARISON:  None Available. FINDINGS: MRA intracranial: Right-side: The internal carotid arteries patent without significant stenosis. There is a large posterior communicating artery which is patent. The middle cerebral artery is patent without severe stenosis or proximal branch occlusion. The anterior cerebral artery is patent. Left side: The internal carotid arteries patent without significant stenosis. The middle cerebral arteries patent without significant stenosis or proximal occlusion. The anterior cerebral arteries patent. Posterior circulation: The left vertebral artery is patent. There is no flow seen in the right vertebral artery. There is no basilar stenosis. There is a proximal occlusion of the left posterior cerebral artery. The right is patent. 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 The left vertebral artery is patent. The basilar artery is patent without significant stenosis. There is a proximal occlusion of the left posterior cerebral artery. The right posterior cerebral artery is mainly supplied by the posterior communicating artery. No proximal occlusion in the anterior circulation. Electronically Signed   By: Nancyann Burns M.D.   On: 05/03/2024 14:39   MR Brain W and Wo Contrast Result Date: 05/03/2024 CLINICAL DATA:  New onset seizure EXAM: MRI HEAD WITHOUT AND WITH CONTRAST TECHNIQUE: Multiplanar, multiecho pulse sequences of the brain and surrounding structures were obtained without  and with intravenous contrast. CONTRAST:  7.5mL GADAVIST GADOBUTROL 1 MMOL/ML IV SOLN COMPARISON:  CT/CTA 05/02/2024 FINDINGS: MRI brain: There are acute infarcts involving the right-side of the pons, left side of the thalamus, and bilateral cerebellar hemispheres. There is hemorrhage in the right pontine  infarct. There are old lacunar infarcts involving the right periventricular white matter. No abnormal enhancement. The ventricles are normal. No mass lesion. There are normal flow signals in the carotid arteries and basilar artery. No significant bone marrow signal abnormality. No significant abnormality in the paranasal sinuses or soft tissues. IMPRESSION: There are acute infarcts involving the right-side of the pons, left side of the thalamus and bilateral cerebellar hemispheres. There is hemorrhage in the right-side of pons Electronically Signed   By: Nancyann Burns M.D.   On: 05/03/2024 14:32    Vitals:   05/04/24 0815 05/04/24 0830 05/04/24 0845 05/04/24 0900  BP: 139/78 119/84 121/68 113/79  Pulse: 81 73 65 68  Resp: 15 12 12 14   Temp: 99 F (37.2 C) 99 F (37.2 C) 99 F (37.2 C) 99 F (37.2 C)  TempSrc:      SpO2: 100% 100% 100% 100%  Weight:      Height:         PHYSICAL EXAM General:  Alert, well-nourished, well-developed patient in no acute distress Psych:  Mood and affect appropriate for situation CV: Regular rate and rhythm on monitor Respiratory:  Regular, unlabored respirations on room air GI: Abdomen soft and nontender  NEURO:  Mental Status:  Patient is intubated, sedation was turned off for this exam.  He spontaneously opens eyes very briefly Does not follow commands.  Cranial Nerves:  II: PERRL. No blink to threat on left.  III, IV, VI: With passive eye raise, conjugate gaze V, VII: ETT in place. Corneal reflex intact, greater on right IX, X: Cough and gag reflexes intact Motor/sensory:  Spontaneous against resistance movement seen in RLE only Extensor posturing seen in bilateral upper extremities with noxious stimuli Strong withdrawal seen RLE.  Coordination: Unable to assess Gait- deferred  Most Recent NIH 21    ASSESSMENT/PLAN  Gregory Shields is a 73 y.o. 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 Now s/p TICI 2B. Admitted for ICU monitoring and stroke workup. NIH on Admission: 28.  Stroke: posterior circulation infarcts with BA occlusion s/p IR with TICI 2B, etiology likely Afib not on AC  CT head No acute abnormality.  Possible remote infarct right corona radiata, extending into basal ganglia.  Small remote infarct right cerebellum. CTA head & neck with perfusion Positive CTA for acute basilar thrombosis.  Right SCA possibly occluded as it is not visualized.  Occlusion of left PCA at proximal left P2 segment.  Occlusion of r hide ight vertebral artery at origin, remains occluded at vertebral basilar junction. Post IR CT head No evidence of acute infarct or intracranial hemorrhage MRI: Acute right pontine infarcts, left thalamic and bilateral cerebellar infarcts. Right side pontine hemorrhage MRA  Right vertebral artery occlusion, Proximal occlusion left PCA CT repeat in am 2D Echo EF 40-45% LDL 124 HgbA1c 4.5 UDS + THC  VTE prophylaxis - Lovenox aspirin  325 mg daily Eliquis  (noncompliant) prior to admission, on aspirin  81 mg daily.  Continue to hold [facility] due to pontine hemorrhage seen on MRI Therapy recommendations:  Pending Disposition:  pending  Acute respiratory failure Decreased ability to protect airway secondary to multiple acute infarcts CCM management, appreciate assistance Mental status precluding extubation at this time   Seizures vs. Posturing  Likely posturing secondary to brainstem stroke, low concern for seizure. Status post Keppra 4.25 g load 9/30 Continue Keppra maintenance dose of 1 g twice daily LTM EEG negative for seizures, will discontinue On propofol, wean as tolerated. OK to transition to Precedex.   Atrial fibrillation Noted in multiple prior notes is not taking eliquis  due to patient choice. Hx of LV thrombus in 2015 Outpatient cardiology  appointment 2021 notes that patient stopped taking his Eliquis  approximately a year ago and is only taking aspirin . Now on ASA 81. Will hold off on AC d/t pontine hemorrhage seen on MRI.   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 Started on Eliquis  at this time as well as aspirin  and Brilinta . Outpatient cardiology appointment 2021 notes that patient stopped taking his Eliquis  approximately a year ago and is only taking aspirin .  Hypertension Home meds: None Unstable, requiring pressors Phenylephrine, wean as tolerated Blood pressure goal 120-160, per IR  Hyperlipidemia Home meds: None LDL 124, goal < 70 Add Lipitor  40 mg  Continue statin at discharge  Dysphagia Patient has post-stroke dysphagia, SLP consulted On IVF, increased to 125 ml/hr On TF @ 55  Other Stroke Risk Factors Advanced age greater than/equal to 73 CAD/MI with hx of LV thrombus Obstructive sleep apnea, not on CPAP at home Cardiomyopathy with EF 40-45%  Other Active Problems Leukocytosis 12.2--9.4--12.3--10.8, Afebrile  Hospital day # 2   Pt seen by Neuro NP/APP and later by MD. Note/plan to be edited by MD as needed.    Gregory JAYSON Likes, DNP [facility] Please use AMION for contact information & EPIC for messaging.   ATTENDING NOTE: I reviewed above note and agree with the assessment and plan. Pt was seen and examined.   No bedside.  Patient still intubated, off sedation, seems briefly open eyes on voice, however not tracking not follow commands.  Eyes midline, not blinking to visual threat.  Corneal reflex present, left stronger than right.  Possible gag.  On pain stimulation on the upper extremity, bilateral upper and lower extremity posturing, with pain stimulation on the lower extremity, strong withdrawal right lower extremity. Sensation, coordination and gait not tested.  Patient  seems slightly more responsive after off sedation, however mental status still not enough for extubation.  Patient likely not able to protect airway given large pontine stroke and hemorrhagic transformation.  Likely need trach and PEG.  Pending CT in a.m.  Continue aspirin  for now.  On statin.  On tube feeding and IV fluid.  For detailed assessment and plan, please refer to above as I have made changes wherever appropriate.   Ary Cummins, MD PhD Stroke Neurology 05/04/2024 5:46 PM  This patient is critically ill due to basilar artery occlusion, brainstem stroke status post thrombectomy, bilateral posturing A-fib not on AC, respiratory failure and at significant risk of neurological worsening, death form recurrent stroke, hemorrhagic transformation, heart failure, renal failure, seizure. This patient's care requires constant monitoring of vital signs, hemodynamics, respiratory and cardiac monitoring, review of multiple databases, neurological assessment, discussion with family, other specialists and medical decision making of high complexity. I spent 40 minutes of neurocritical care time in the care of this patient.  Discussed with Dr. Gatha CCM.

## 2024-05-04 NOTE — Progress Notes (Addendum)
 Pharmacy Electrolyte Replacement  Recent Labs:  Recent Labs    05/03/24 1150 05/04/24 0539  K 3.8 3.6  MG 1.9  --   CREATININE 1.02 0.99    Low Critical Values (K </= 2.5, Phos </= 1, Mg </= 1) Present: None  Plan: Give KCL 40 mEq per tube once.  Kristain Filo, PharmD

## 2024-05-04 NOTE — Procedures (Addendum)
 Patient Name: Gregory Shields  MRN: [id number]  Epilepsy Attending: Arlin MALVA Krebs  Referring Physician/Provider: Sallyann Normie HERO, MD  Duration: 05/03/2024 0730 to 05/04/2024 1128  Patient history: 73 y.Gregory. 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 EEG to evaluate for seizure  Level of alertness: comatose  AEDs during EEG study: LEV, propofol  Technical aspects: This EEG study was done with scalp electrodes positioned according to the 10-20 International system of electrode placement. Electrical activity was reviewed with band pass filter of 1-70Hz , sensitivity of 7 uV/mm, display speed of 45mm/sec with a 60Hz  notched filter applied as appropriate. EEG data were recorded continuously and digitally stored.  Video monitoring was available and reviewed as appropriate.  Description: EEG showed continuous generalized 3 to 6 Hz theta-delta slowing admixed with 15 to 18 Hz beta activity distributed symmetrically and diffusely.  Hyperventilation and photic stimulation were not performed.    Event button was pressed on 05/03/2024 at 2000 for posturing.  Concomitant EEG before, during and after the event did not show any EEG changes suggest seizure.  EEG was disconnected between 05/03/2024 1214 to 1455 for MRI brain and between 05/03/2024 2347 to 05/04/2024 0745 due to technical difficulties  ABNORMALITY - Continuous slow, generalized  IMPRESSION: This study is suggestive of severe diffuse encephalopathy, nonspecific etiology but likely related to sedation. No seizures or epileptiform discharges were seen throughout the recording.  Event button was pressed on 05/03/2024 at 2000 for posturing without concomitant EEG change.  This was most likely not an epileptic event.   Gregory Shields Gregory Shields

## 2024-05-04 NOTE — Progress Notes (Signed)
 Brief Nutrition Support Note  New consult received for initiation of enteral support. Pt remains intubated with OGT in place. For last full nutrition evaluation, see RD note from 10/1.   INTERVENTION:  Initiate tube feeding via OGT: Osmolite 1.5 at 55 ml/h (1320 ml per day) Start at 25ml/h and increase by 10 ml q8h until goal rate reached Prosource TF20 60 ml daily Provides 2060 kcal, 102 gm protein, 1005 ml free water daily Monitor magnesium and phosphorus daily x 4 occurrences, MD to replete as needed, as pt is at risk for refeeding syndrome given moderate malnutrition and limited diet hx. Thiamine 100 mg daily for 7 days   Vernell Lukes, RD, LDN, CNSC Registered Dietitian II Please reach out via secure chat

## 2024-05-05 ENCOUNTER — Inpatient Hospital Stay (HOSPITAL_COMMUNITY)

## 2024-05-05 DIAGNOSIS — Z91148 Patient's other noncompliance with medication regimen for other reason: Secondary | ICD-10-CM

## 2024-05-05 DIAGNOSIS — I6329 Cerebral infarction due to unspecified occlusion or stenosis of other precerebral arteries: Secondary | ICD-10-CM | POA: Diagnosis not present

## 2024-05-05 DIAGNOSIS — J9601 Acute respiratory failure with hypoxia: Secondary | ICD-10-CM | POA: Diagnosis not present

## 2024-05-05 DIAGNOSIS — I69391 Dysphagia following cerebral infarction: Secondary | ICD-10-CM

## 2024-05-05 DIAGNOSIS — I5022 Chronic systolic (congestive) heart failure: Secondary | ICD-10-CM | POA: Diagnosis not present

## 2024-05-05 DIAGNOSIS — I480 Paroxysmal atrial fibrillation: Secondary | ICD-10-CM

## 2024-05-05 DIAGNOSIS — I63543 Cerebral infarction due to unspecified occlusion or stenosis of bilateral cerebellar arteries: Secondary | ICD-10-CM

## 2024-05-05 DIAGNOSIS — I6322 Cerebral infarction due to unspecified occlusion or stenosis of basilar arteries: Secondary | ICD-10-CM | POA: Diagnosis not present

## 2024-05-05 DIAGNOSIS — I613 Nontraumatic intracerebral hemorrhage in brain stem: Secondary | ICD-10-CM

## 2024-05-05 DIAGNOSIS — E440 Moderate protein-calorie malnutrition: Secondary | ICD-10-CM

## 2024-05-05 DIAGNOSIS — R29723 NIHSS score 23: Secondary | ICD-10-CM

## 2024-05-05 LAB — CBC
HCT: 37.6 % — ABNORMAL LOW (ref 39.0–52.0)
Hemoglobin: 13 g/dL (ref 13.0–17.0)
MCH: 32.7 pg (ref 26.0–34.0)
MCHC: 34.6 g/dL (ref 30.0–36.0)
MCV: 94.7 fL (ref 80.0–100.0)
Platelets: 129 K/uL — ABNORMAL LOW (ref 150–400)
RBC: 3.97 MIL/uL — ABNORMAL LOW (ref 4.22–5.81)
RDW: 12.5 % (ref 11.5–15.5)
WBC: 7.8 K/uL (ref 4.0–10.5)
nRBC: 0 % (ref 0.0–0.2)

## 2024-05-05 LAB — PHOSPHORUS: Phosphorus: 1.9 mg/dL — ABNORMAL LOW (ref 2.5–4.6)

## 2024-05-05 LAB — GLUCOSE, CAPILLARY
Glucose-Capillary: 125 mg/dL — ABNORMAL HIGH (ref 70–99)
Glucose-Capillary: 144 mg/dL — ABNORMAL HIGH (ref 70–99)
Glucose-Capillary: 131 mg/dL — ABNORMAL HIGH (ref 70–99)
Glucose-Capillary: 142 mg/dL — ABNORMAL HIGH (ref 70–99)
Glucose-Capillary: 167 mg/dL — ABNORMAL HIGH (ref 70–99)
Glucose-Capillary: 115 mg/dL — ABNORMAL HIGH (ref 70–99)

## 2024-05-05 LAB — BASIC METABOLIC PANEL WITH GFR
Anion gap: 8 (ref 5–15)
BUN: 12 mg/dL (ref 8–23)
CO2: 23 mmol/L (ref 22–32)
Calcium: 7.9 mg/dL — ABNORMAL LOW (ref 8.9–10.3)
Chloride: 110 mmol/L (ref 98–111)
Creatinine, Ser: 0.82 mg/dL (ref 0.61–1.24)
GFR, Estimated: >60 mL/min (ref >60)
Glucose, Bld: 129 mg/dL — ABNORMAL HIGH (ref 70–99)
Potassium: 3.7 mmol/L (ref 3.5–5.1)
Sodium: 141 mmol/L (ref 135–145)

## 2024-05-05 LAB — HEPARIN LEVEL (UNFRACTIONATED): Heparin Unfractionated: 0.14 [IU]/mL — ABNORMAL LOW (ref 0.30–0.70)

## 2024-05-05 LAB — MAGNESIUM: Magnesium: 1.9 mg/dL (ref 1.7–2.4)

## 2024-05-05 MED ORDER — POTASSIUM & SODIUM PHOSPHATES 280-160-250 MG PO PACK
2.0000 | PACK | ORAL | Status: AC
  Administered 2024-05-05 (×4): 2
  Filled 2024-05-05: qty 2
  Filled 2024-05-05 (×2): qty 1
  Filled 2024-05-05 (×2): qty 2

## 2024-05-05 MED ORDER — SODIUM CHLORIDE 0.9 % IV SOLN: INTRAVENOUS | Status: AC

## 2024-05-05 MED ORDER — AMPICILLIN-SULBACTAM SODIUM 3 (2-1) G IJ SOLR
3.0000 g | Freq: Three times a day (TID) | INTRAMUSCULAR | Status: DC
  Administered 2024-05-05 – 2024-05-07 (×7): 3 g via INTRAVENOUS
  Filled 2024-05-05 (×7): qty 8

## 2024-05-05 MED ORDER — BACLOFEN 10 MG PO TABS: 10.0000 mg | ORAL_TABLET | Freq: Two times a day (BID) | ORAL | Status: DC

## 2024-05-05 MED ORDER — LABETALOL HCL 5 MG/ML IV SOLN
10.0000 mg | INTRAVENOUS | Status: DC | PRN
  Administered 2024-05-05 (×2): 20 mg via INTRAVENOUS
  Administered 2024-05-06: 10 mg via INTRAVENOUS
  Administered 2024-05-07: 20 mg via INTRAVENOUS
  Administered 2024-05-07: 10 mg via INTRAVENOUS
  Administered 2024-05-09: 20 mg via INTRAVENOUS
  Filled 2024-05-05 (×6): qty 4

## 2024-05-05 MED ORDER — IOHEXOL 350 MG/ML SOLN: 75.0000 mL | Freq: Once | INTRAVENOUS | Status: DC | PRN

## 2024-05-05 MED ORDER — HYDRALAZINE HCL 20 MG/ML IJ SOLN
10.0000 mg | INTRAMUSCULAR | Status: DC | PRN
  Administered 2024-05-05 – 2024-05-07 (×3): 20 mg via INTRAVENOUS
  Filled 2024-05-05 (×3): qty 1

## 2024-05-05 MED ORDER — BACLOFEN 10 MG PO TABS
10.0000 mg | ORAL_TABLET | Freq: Two times a day (BID) | ORAL | Status: DC
  Administered 2024-05-05 – 2024-05-14 (×16): 10 mg
  Filled 2024-05-05 (×17): qty 1

## 2024-05-05 MED ORDER — POLYETHYLENE GLYCOL 3350 17 G PO PACK
17.0000 g | PACK | Freq: Every day | ORAL | Status: DC
  Administered 2024-05-05 – 2024-05-07 (×2): 17 g
  Filled 2024-05-05 (×2): qty 1

## 2024-05-05 MED ORDER — FENTANYL CITRATE PF 50 MCG/ML IJ SOSY
50.0000 ug | PREFILLED_SYRINGE | Freq: Once | INTRAMUSCULAR | Status: AC
  Administered 2024-05-05: 50 ug via INTRAVENOUS
  Filled 2024-05-05: qty 1

## 2024-05-05 MED ORDER — HEPARIN (PORCINE) 25000 UT/250ML-% IV SOLN
1500.0000 [IU]/h | INTRAVENOUS | Status: DC
  Administered 2024-05-05: 1050 [IU]/h via INTRAVENOUS
  Administered 2024-05-06: 1400 [IU]/h via INTRAVENOUS
  Administered 2024-05-07: 1450 [IU]/h via INTRAVENOUS
  Administered 2024-05-08 (×2): 1500 [IU]/h via INTRAVENOUS
  Filled 2024-05-05 (×5): qty 250

## 2024-05-05 NOTE — Progress Notes (Signed)
 Foley removed 10/2 @ 1051. Pt has spontaneously voided; however, urine output continues to be low. Pt bladder scanned twice during this shift (see flowsheets). Elink notified, fluids reordered. No other orders.

## 2024-05-05 NOTE — Progress Notes (Signed)
 PT Cancellation Note  Patient Details Name: Gregory Shields MRN: [id number] DOB: 01/19/1951   Cancelled Treatment:    Reason Eval/Treat Not Completed: Patient not medically ready. Pt intubated and RN requesting hold on PT this date and request PT check back over weekend as able.   Theo Ferretti, PT, DPT Acute Rehabilitation Services  Office: [phone]    Theo CHRISTELLA Ferretti 05/05/2024, 9:22 AM

## 2024-05-05 NOTE — TOC CM/SW Note (Signed)
 Transition of Care [facility]) - Inpatient Brief Assessment   Patient Details  Name: Gregory Shields MRN: [id number] Date of Birth: [birth year]  Transition of Care [facility]) CM/SW Contact:    Ashanna Heinsohn M, RN Phone Number: 05/05/2024, 4:38 PM   Clinical Narrative: Patient found unresponsive in his yard on 9/30; ? Seizure.  Patient intubated for airway protection. 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 He the patient was outside the window for TNK but was taken to IR where he underwent thrombectomy.   Inpatient Care Management will follow for discharge planning as patient progresses.    Transition of Care Asessment: Insurance and Status: Insurance coverage has been reviewed Patient has primary care physician: Yes (Dr. Chet) Home environment has been reviewed: Lives with spouse Prior level of function:: Independent Prior/Current Home Services: No current home services Social Drivers of Health Review: SDOH reviewed no interventions necessary Readmission risk has been reviewed: Yes Transition of care needs: transition of care needs identified, TOC will continue to follow  Mliss MICAEL Fass, RN, BSN  Trauma/Neuro ICU Case Manager [phone]

## 2024-05-05 NOTE — Progress Notes (Signed)
 Pharmacy Electrolyte Replacement  Recent Labs:  Recent Labs    05/05/24 0516  K 3.7  MG 1.9  PHOS 1.9*  CREATININE 0.82    Low Critical Values (K </= 2.5, Phos </= 1, Mg </= 1) Present: None  Plan: Give potassium phosphate 280-160-250 MG packet- 2 packets every 4 hours for 4 doses.   Lelani Garnett, PharmD

## 2024-05-05 NOTE — Progress Notes (Signed)
 Pt transported on ventilator to CT and back w/o complication.

## 2024-05-05 NOTE — Progress Notes (Addendum)
 NAME:  Gregory Shields, MRN:  [id number], DOB:  [birth year], LOS: 3 ADMISSION DATE:  05/02/2024, CONSULTATION DATE: 9/30 REFERRING MD: Dr. Sallyann, CHIEF COMPLAINT: Stroke  History of Present Illness:  73 year old male with past medical history as below, which is significant for coronary artery disease, depression, hypertension, sleep apnea, and CVA.  He was in his usual state of health until 9/30. LKW 1530. Found unresponsive in his yard some time later.  There was some concern for seizure-like activity during EMS evaluation and he was administered Versed .  Upon arrival to the emergency department [phone] he was intubated for airway protection.  CT scan showed no acute hemorrhage.  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  He the patient was outside the window for TNK but was taken to IR where he underwent thrombectomy.  Postoperatively he was transferred to the ICU on the mechanical ventilator.  PCCM was consulted.  Pertinent  Medical History   has a past medical history of Coronary artery disease, Depression, Hypertension, Myocardial infarction [facility]), Sleep apnea, and Stroke (HCC).   Significant Hospital Events: Including procedures, antibiotic start and stop dates in addition to other pertinent events   9/30 presented with basilar artery occlusion and underwent mechanical thrombectomy.  Intubated 10/2 -not able to follow commands despite sedation being completely turned down.  Tolerating pressure support ventilation but airway is not patent.  Start tube feeds  Interim History / Subjective:  Patient's urine output decreased overnight, was started on IV fluid though serum creatinine remained stable Remained afebrile, started having greenish/yellow secretions via ET tube Tolerating spontaneous breathing trial   Objective    Blood pressure 104/64, pulse 79, temperature 97.7 F (36.5 C), temperature source Oral, resp. rate 11,  height 5' 8 (1.727 m), weight 78.6 kg, SpO2 100%.    Vent Mode: CPAP;PSV FiO2 (%):  [40 %] 40 % Set Rate:  [16 bmp] 16 bmp Vt Set:  [530 mL] 530 mL PEEP:  [5 cmH20] 5 cmH20 Pressure Support:  [10 cmH20] 10 cmH20 Plateau Pressure:  [19 cmH20] 19 cmH20   Intake/Output Summary (Last 24 hours) at 05/05/2024 0843 Last data filed at 05/05/2024 0700 Gross per 24 hour  Intake 3224.41 ml  Output 185 ml  Net 3039.41 ml   Filed Weights   05/02/24 2318 05/05/24 0500  Weight: 75 kg 78.6 kg    Examination: General: Crtitically ill-appearing elderly male, orally intubated HEENT: Twilight/AT, eyes anicteric.  ETT and OGT in place Neuro: Opens eyes with vocal stimuli, following simple commands like sticking out tongue, pending right knee.  Plegic right upper extremity and left side, moving right leg spontaneously and with commands Chest: Coarse breath sounds, no wheezes or rhonchi Heart: Tachycardic, regular rhythm, no murmurs or gallops Abdomen: Soft, nondistended, bowel sounds present  Labs and images reviewed  Patient Lines/Drains/Airways Status     Active Line/Drains/Airways     Name Placement date Placement time Site Days   Peripheral IV 05/04/24 20 G 1 Anterior;Right Forearm 05/04/24  1029  Forearm  1   Peripheral IV 05/04/24 20 G Left;Posterior Forearm 05/04/24  1929  Forearm  1   NG/OG Vented/Dual Lumen 16 Fr. 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 Continue PT/OT evaluation Swallow evaluation postextubation  Chronic HFrEF Echocardiogram showing EF 40 to 45% GDMT as tolerated  Paroxysmal Atrial fib: Noncompliant with Eliquis  Heart rate is controlled, currently in sinus rhythm Anticoagulation deferred to neurology Continue telemetry monitoring  Acute respiratory failure with hypoxia Possible aspiration pneumonia Continue lung protective ventilation VAP prevention bundle in place Off sedation Tolerating spontaneous breathing trial Will send respiratory culture Started on IV Unasyn  Possible seizure EEG has been negative Continue Keppra for now  OSA - uncertain if on CPAP - supportive care  Hypophosphatemia Continue aggressive electrolyte replacement  Moderate malnutrition Continue tube feeds and dietary supplements    Labs   CBC: Recent Labs  Lab 05/02/24 1851 05/02/24 1908 05/02/24 1956 05/02/24 2146 05/03/24 1150 05/04/24 0539 05/05/24 0516  WBC 12.2*  --   --  9.4 12.3* 10.8* 7.8  NEUTROABS 10.5*  --   --   --   --   --   --   HGB 16.2   < > 14.3 13.4 15.1 14.5 13.0  HCT 46.0   < > 42.0 38.3* 43.6 42.1 37.6*  MCV 92.2  --   --  93.2 94.8 93.8 94.7  PLT 197  --   --  151 184 162 129*   < > = values in this interval not displayed.    Basic Metabolic Panel: Recent Labs  Lab 05/02/24 1851 05/02/24 1908 05/02/24 1951 05/02/24 1956 05/02/24 2146 05/03/24 1150 05/04/24 0539 05/04/24 1109 05/05/24 0516  NA 135 137  --  135  --  136 136  --  141  K 4.2 4.1  --  3.8  --  3.8 3.6  --  3.7  CL 103 102  --   --   --  106 107  --  110  CO2 20*  --   --   --   --  19* 22  --  23  GLUCOSE 123* 123*  --   --   --  91 92  --  129*  BUN 17 19  --   --   --  12 10  --  12  CREATININE 1.03 1.00  --   --  0.87 1.02 0.99  --  0.82  CALCIUM  8.6*  --   --   --   --  8.2* 7.9*  --  7.9*  MG  --   --  1.9  --   --  1.9  --  1.9 1.9   PHOS  --   --   --   --   --   --   --  2.5 1.9*   GFR: Estimated Creatinine Clearance: 81.1 mL/min (by C-G formula based on SCr of 0.82 mg/dL). Recent Labs  Lab 05/02/24 1908 05/02/24 2146 05/03/24 1150 05/04/24 0539 05/05/24 0516  WBC  --  9.4 12.3* 10.8* 7.8  LATICACIDVEN 1.9  --   --   --   --     Liver Function Tests: Recent Labs  Lab 05/02/24 1851  AST 23  ALT 11  ALKPHOS 57  BILITOT 1.3*  PROT 6.5  ALBUMIN 3.9   No results for input(s): LIPASE, AMYLASE in the last 168 hours. No results for input(s): AMMONIA in the last 168 hours.  ABG    Component Value Date/Time   PHART 7.350 05/02/2024 1956   PCO2ART 43.3 05/02/2024 1956   PO2ART 531 (H) 05/02/2024 1956   HCO3 23.9 05/02/2024 1956   TCO2 25 05/02/2024 1956   ACIDBASEDEF 2.0 05/02/2024 1956   O2SAT 100 05/02/2024 1956     Coagulation Profile: No results for input(s): INR, PROTIME in the last 168 hours.  Cardiac Enzymes: No results for input(s): CKTOTAL, CKMB, CKMBINDEX, TROPONINI in the last 168 hours.  HbA1C: Hgb A1c MFr Bld  Date/Time Value Ref Range Status  05/02/2024 09:46 PM 4.5 (L) 4.8 - 5.6 % Final    Comment:    (NOTE) Diagnosis of Diabetes The following HbA1c ranges recommended by the American Diabetes Association (ADA) may be used as an aid in the diagnosis of diabetes mellitus.  Hemoglobin             Suggested A1C NGSP%              Diagnosis  <5.7                   Non Diabetic  5.7-6.4                Pre-Diabetic  >6.4                   Diabetic  <7.0                   Glycemic control for                       adults with diabetes.    05/14/2014 03:08 AM 5.1 <5.7 % Final    Comment:    (NOTE)                                                                       According to the ADA Clinical Practice Recommendations for 2011, when HbA1c is used as a screening test:  >=6.5%   Diagnostic of Diabetes Mellitus           (if abnormal result is  confirmed) 5.7-6.4%   Increased risk of developing Diabetes Mellitus References:Diagnosis and Classification of Diabetes Mellitus,Diabetes Care,2011,34(Suppl 1):S62-S69 and Standards of Medical Care in         Diabetes - 2011,Diabetes Care,2011,34 (Suppl 1):S11-S61.    CBG: Recent Labs  Lab 05/04/24 1551 05/04/24 1914 05/04/24 2320 05/05/24 0318 05/05/24 0746  GLUCAP 131* 131* 116* 125* 144*    The patient is critically ill due to Acute bilateral ischemic stroke/acute respiratory failure with hypoxia.  Critical care was necessary to treat or prevent imminent or life-threatening deterioration.  Critical care was time spent personally by me on the following activities: development of treatment plan with patient and/or surrogate as well as nursing, discussions with consultants, evaluation of patient's response to treatment, examination of patient, obtaining history from patient or surrogate, ordering and performing treatments and interventions, ordering and review of laboratory studies, ordering and review of radiographic studies, pulse oximetry, re-evaluation of patient's condition and participation in multidisciplinary rounds.  During this encounter critical care time was devoted to patient care services described in this note for 40 minutes.     Valinda Novas, MD [facility] See Amion for pager If no response to pager, please call [phone] until 7pm After 7pm, Please call E-link [phone]

## 2024-05-05 NOTE — Plan of Care (Signed)
  Problem: Ischemic Stroke/TIA Tissue Perfusion: Goal: Complications of ischemic stroke/TIA will be minimized Outcome: Progressing   Problem: Coping: Goal: Will identify appropriate support needs Outcome: Progressing   Problem: Nutrition: Goal: Dietary intake will improve Outcome: Progressing

## 2024-05-05 NOTE — Progress Notes (Addendum)
 STROKE TEAM PROGRESS NOTE    SIGNIFICANT HOSPITAL EVENTS  9/30:    Presented after witnessed seizure by neighbor and EMS.              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   INTERIM HISTORY/SUBJECTIVE NO family at the bedside. Patient remains intubated and on no sedation.  Eyes are open, moves right side spontaneously and purposeful, left leg is semi purposeful, blinks bilaterally, possibly follows simple commands intermittently  Repeat Ct head this morning with no hemorrhage. Will start IV heparin  stroke protocol     CBC    Component Value Date/Time   WBC 7.8 05/05/2024 0516   RBC 3.97 (L) 05/05/2024 0516   HGB 13.0 05/05/2024 0516   HCT 37.6 (L) 05/05/2024 0516   PLT 129 (L) 05/05/2024 0516   MCV 94.7 05/05/2024 0516   MCH 32.7 05/05/2024 0516   MCHC 34.6 05/05/2024 0516   RDW 12.5 05/05/2024 0516   LYMPHSABS 1.0 05/02/2024 1851   MONOABS 0.5 05/02/2024 1851   EOSABS 0.0 05/02/2024 1851   BASOSABS 0.0 05/02/2024 1851    BMET    Component Value Date/Time   NA 141 05/05/2024 0516   NA 139 06/21/2020 1650   K 3.7 05/05/2024 0516   CL 110 05/05/2024 0516   CO2 23 05/05/2024 0516   GLUCOSE 129 (H) 05/05/2024 0516   BUN 12 05/05/2024 0516   BUN 13 06/21/2020 1650   CREATININE 0.82 05/05/2024 0516   CALCIUM  7.9 (L) 05/05/2024 0516   GFRNONAA >60 05/05/2024 0516    IMAGING past 24 hours CT HEAD WO CONTRAST ( ) Result Date: 05/05/2024 EXAM: CT HEAD WITHOUT CONTRAST 05/05/2024 12:55:33 AM TECHNIQUE: CT of the head was performed without the administration of intravenous contrast. Automated exposure control, iterative reconstruction, and/or weight based adjustment of the mA/kV was utilized to reduce the radiation dose to as low as  reasonably achievable. COMPARISON: MRI dated 05/03/2024. CLINICAL HISTORY: Stroke, follow up. FINDINGS: BRAIN AND VENTRICLES: No acute hemorrhage. Multiple recent infarcts of the cerebellum, left thalamus and right pons has demonstrated on recent MRI. Chronic ischemic white matter changes. Generalized volume loss. No hydrocephalus. No extra-axial collection. No mass effect or midline shift. ORBITS: No acute abnormality. SINUSES: No acute abnormality. SOFT TISSUES AND SKULL: No acute soft tissue abnormality. No skull fracture. IMPRESSION: 1. Multiple recent infarcts of the cerebellum, left thalamus, and right pons, as demonstrated on recent MRI. 2. Chronic ischemic white matter changes. 3. Generalized volume loss. Electronically signed by: Franky Stanford MD 05/05/2024 01:51 AM EDT RP Workstation: [id number]    Vitals:   05/05/24 1000 05/05/24 1100 05/05/24 1120 05/05/24 1200  BP: (!) 155/98 (!) 167/95 (!) 160/89   Pulse: (!) 110 96 96   Resp: 19 17 18    Temp:    98.9 F (37.2 C)  TempSrc:    Oral  SpO2: 100% 100% 100%   Weight:      Height:         PHYSICAL EXAM General:  critically ill intubated elderly male  Psych:  Mood and affect appropriate for situation CV: Regular rate and rhythm on monitor Respiratory:  Regular, unlabored respirations on ventilator  GI: Abdomen soft and nontender   NEURO:  Mental Status:  Patient is intubated,on no sedation. Eyes are open  ? Possibly following simple commands intermittently   Cranial Nerves:  II: PERRL. Blinks bilaterally  III, IV, VI: With passive eye raise, conjugate gaze V, VII: ETT in place. Corneal reflex intact, greater on right IX, X: Cough and gag reflexes intact Motor/sensory:  moves right side spontaneously and purposeful, left leg is semi purposeful, Strong withdrawal seen RLE.  Coordination: Unable to assess Gait- deferred   Most Recent NIH 23   ASSESSMENT/PLAN [name] is a 73 y.o. male with PMH of LV mural  thrombus (2015, c/b bilateral multiple infarcts, without residual deficit), HFrEF, afib, not on Eliquis  who presents with new seizures and found to have basilar occlusion. Now s/p TICI 2B. Admitted for ICU monitoring and stroke workup. NIH on Admission: 28.   Stroke: posterior circulation infarcts with BA occlusion s/p IR with TICI 2B, etiology likely Afib not on AC  CT head No acute abnormality.  Possible remote infarct right corona radiata, extending into basal ganglia.  Small remote infarct right cerebellum. CTA head & neck with perfusion Positive CTA for acute basilar thrombosis.  Right SCA possibly occluded as it is not visualized.  Occlusion of left PCA at proximal left P2 segment.  Occlusion of r hide ight vertebral artery at origin, remains occluded at vertebral basilar junction. Post IR CT head No evidence of acute infarct or intracranial hemorrhage MRI: Acute right pontine infarcts, left thalamic and bilateral cerebellar infarcts. Right side pontine hemorrhage MRA  Right vertebral artery occlusion, Proximal occlusion left PCA Repeat CT head 10/3 2D Echo EF 40-45% LDL 124 HgbA1c 4.5 UDS + THC  VTE prophylaxis -IV heparin   aspirin  325 mg daily Eliquis  (noncompliant) prior to admission,on IV heparin  started today  Therapy recommendations:  Pending Disposition:  pending   Acute respiratory failure Possible ASp Pneumonia  Decreased ability to protect airway secondary to multiple acute infarcts CCM management, appreciate assistance Mental status precluding extubation at this time  On unasyn    Seizures vs. Posturing  Likely posturing secondary to brainstem stroke, low concern for seizure. Status post Keppra 4.25 g load 9/30 Continue Keppra maintenance dose of 1 g twice daily LTM EEG negative for seizures, will discontinue On propofol, wean as tolerated. OK to transition to Precedex.    Paroxsymal Atrial fibrillation Noted in multiple prior notes is not taking eliquis  due to  patient choice. Hx of LV thrombus in 2015 Outpatient cardiology appointment 2021 notes that patient stopped taking his Eliquis  approximately a year ago and is only taking aspirin . 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 Started on Eliquis  at this time as well as aspirin  and Brilinta . Outpatient cardiology appointment 2021 notes that patient stopped taking his Eliquis  approximately a year ago and is only taking aspirin .   Hypertension CHF redused EF Home meds: None Unstable, requiring pressors Phenylephrine, wean as tolerated Blood pressure goal 120-160, per IR   Hyperlipidemia Home meds: None LDL 124, goal < 70 Add Lipitor  40 mg  Continue statin at discharge   Dysphagia Patient has post-stroke dysphagia, SLP consulted On IVF, increased to 125 ml/hr On TF @ 55   Other Stroke Risk Factors Advanced age greater than/equal to 59 CAD/MI with hx of LV thrombus Obstructive sleep apnea, not on CPAP at home Cardiomyopathy with EF 40-45%   Other Active Problems Leukocytosis 12.2--9.4--12.3--10.8,  Afebrile    Hospital day # 3   Karna Geralds DNP, ACNPC-AG  [facility]  I have personally obtained history,examined this patient, reviewed notes, independently viewed imaging studies, participated in medical decision making and plan of care.ROS completed by me personally and pertinent positives fully documented  I have made any additions or clarifications directly to the above note. Agree with note above.  Patient remains intubated.  He follows only occasional midline commands.  Moves right side greater than left.  Continue ventilatory support as per critical care team and extubate as tolerated.  No family at the bedside for discussion.  Discussed with Dr. Harold critical care medicine.  Plan to start IV heparin  if patient  unable to swallow. This patient is critically ill and at significant risk of neurological worsening, death and care requires constant monitoring of vital signs, hemodynamics,respiratory and cardiac monitoring, extensive review of multiple databases, frequent neurological assessment, discussion with family, other specialists and medical decision making of high complexity.I have made any additions or clarifications directly to the above note.This critical care time does not reflect procedure time, or teaching time or supervisory time of PA/NP/Med Resident etc but could involve care discussion time.  I spent 30 minutes of neurocritical care time  in the care of  this patient.     Eather Popp, MD Medical Director [facility] Pager: [phone] 05/05/2024 2:27 PM   To contact Stroke Continuity provider, please refer to [email or web address]. After hours, contact General Neurology

## 2024-05-05 NOTE — Progress Notes (Signed)
 PHARMACY - ANTICOAGULATION CONSULT NOTE  Pharmacy Consult for heparin  Indication: atrial fibrillation  Allergies  Allergen Reactions   Other Itching and Other (See Comments)    Pollen- Itchy eyes, sneezing, runny nose, etc..   Penicillins Other (See Comments)    Doesn't remember exact allergic reaction    Patient Measurements: Height: 5' 8 (172.7 cm) Weight: 78.6 kg (173 lb 4.5 oz) IBW/kg (Calculated) : 68.4  Vital Signs: Temp: 99 F (37.2 C) (10/03 2000) Temp Source: Axillary (10/03 2000) BP: 146/90 (10/03 2000) Pulse Rate: 96 (10/03 2000)  Labs: Recent Labs    05/02/24 2146 05/03/24 1150 05/04/24 0539 05/05/24 0516 05/05/24 1946  HGB 13.4 15.1 14.5 13.0  --   HCT 38.3* 43.6 42.1 37.6*  --   PLT 151 184 162 129*  --   HEPARINUNFRC  --   --   --   --  0.14*  CREATININE 0.87 1.02 0.99 0.82  --   TROPONINIHS 33*  --   --   --   --     Estimated Creatinine Clearance: 81.1 mL/min (by C-G formula based on SCr of 0.82 mg/dL).   Medical History: Past Medical History:  Diagnosis Date   Coronary artery disease    Depression    Hypertension    Myocardial infarction [facility])    Sleep apnea    Stroke [facility])       Assessment: 73 yo M with hx LV mural thrombus 2015 and Afib (CHADS2VASc = 6). No anticoagulation prior to admission. Pharmacy consulted for heparin .    Last enoxaparin 10/2 13:40.   10/3 PM update: Heparin  level subtherapeutic at 0.14. No issues with infusion noted or new/worsening signs of bleeding.   Goal of Therapy:  Heparin  level 0.3-0.5 units/ml Monitor platelets by anticoagulation protocol: Yes   Plan:  Increase Heparin  to 1200 units/hr, no bolus F/u AM heparin  level Monitor daily heparin  level, CBC, signs/symptoms of bleeding    Larraine Brazier, PharmD Clinical Pharmacist 05/05/2024  8:28 PM **Pharmacist phone directory can now be found on [email or web address] ([email or web address]).  Listed under [facility].

## 2024-05-05 NOTE — Progress Notes (Addendum)
 PHARMACY - ANTICOAGULATION CONSULT NOTE  Pharmacy Consult for heparin  Indication: atrial fibrillation  Allergies  Allergen Reactions   Other Itching and Other (See Comments)    Pollen- Itchy eyes, sneezing, runny nose, etc..   Penicillins Other (See Comments)    Doesn't remember exact allergic reaction    Patient Measurements: Height: 5' 8 (172.7 cm) Weight: 78.6 kg (173 lb 4.5 oz) IBW/kg (Calculated) : 68.4  Vital Signs: Temp: 98.9 F (37.2 C) (10/03 1200) Temp Source: Oral (10/03 1200) BP: 160/89 (10/03 1120) Pulse Rate: 96 (10/03 1120)  Labs: Recent Labs    05/02/24 1951 05/02/24 1956 05/02/24 2146 05/03/24 1150 05/04/24 0539 05/05/24 0516  HGB  --    < > 13.4 15.1 14.5 13.0  HCT  --    < > 38.3* 43.6 42.1 37.6*  PLT  --   --  151 184 162 129*  CREATININE  --   --  0.87 1.02 0.99 0.82  TROPONINIHS 22*  --  33*  --   --   --    < > = values in this interval not displayed.    Estimated Creatinine Clearance: 81.1 mL/min (by C-G formula based on SCr of 0.82 mg/dL).   Medical History: Past Medical History:  Diagnosis Date   Coronary artery disease    Depression    Hypertension    Myocardial infarction [facility])    Sleep apnea    Stroke [facility])       Assessment: 73 yo M with hx LV mural thrombus 2015 and Afib (CHADS2VASc = 6). No anticoagulation prior to admission. Pharmacy consulted for heparin .    Last enoxaparin 10/2 13:40.   Goal of Therapy:  Heparin  level 0.3-0.5 units/ml Monitor platelets by anticoagulation protocol: Yes   Plan:  Heparin  1050 units/hr, no bolus Stop enoxaparin  F/u 8hr heparin  level Monitor daily heparin  level, CBC, signs/symptoms of bleeding    Jinnie Door, PharmD, BCPS, BCCP Clinical Pharmacist  Please check AMION for all [facility] Pharmacy phone numbers After 10:00 PM, call Main Pharmacy [phone]

## 2024-05-06 ENCOUNTER — Other Ambulatory Visit: Payer: Self-pay

## 2024-05-06 DIAGNOSIS — I5022 Chronic systolic (congestive) heart failure: Secondary | ICD-10-CM | POA: Diagnosis not present

## 2024-05-06 DIAGNOSIS — I63543 Cerebral infarction due to unspecified occlusion or stenosis of bilateral cerebellar arteries: Secondary | ICD-10-CM | POA: Diagnosis not present

## 2024-05-06 DIAGNOSIS — J9601 Acute respiratory failure with hypoxia: Secondary | ICD-10-CM | POA: Diagnosis not present

## 2024-05-06 DIAGNOSIS — I613 Nontraumatic intracerebral hemorrhage in brain stem: Secondary | ICD-10-CM | POA: Diagnosis not present

## 2024-05-06 DIAGNOSIS — I6322 Cerebral infarction due to unspecified occlusion or stenosis of basilar arteries: Secondary | ICD-10-CM | POA: Diagnosis not present

## 2024-05-06 DIAGNOSIS — I480 Paroxysmal atrial fibrillation: Secondary | ICD-10-CM | POA: Diagnosis not present

## 2024-05-06 DIAGNOSIS — I6329 Cerebral infarction due to unspecified occlusion or stenosis of other precerebral arteries: Secondary | ICD-10-CM | POA: Diagnosis not present

## 2024-05-06 LAB — GLUCOSE, CAPILLARY
Glucose-Capillary: 147 mg/dL — ABNORMAL HIGH (ref 70–99)
Glucose-Capillary: 155 mg/dL — ABNORMAL HIGH (ref 70–99)
Glucose-Capillary: 164 mg/dL — ABNORMAL HIGH (ref 70–99)
Glucose-Capillary: 162 mg/dL — ABNORMAL HIGH (ref 70–99)
Glucose-Capillary: 166 mg/dL — ABNORMAL HIGH (ref 70–99)
Glucose-Capillary: 163 mg/dL — ABNORMAL HIGH (ref 70–99)

## 2024-05-06 LAB — HEPARIN LEVEL (UNFRACTIONATED)
Heparin Unfractionated: 0.14 [IU]/mL — ABNORMAL LOW (ref 0.30–0.70)
Heparin Unfractionated: 0.31 [IU]/mL (ref 0.30–0.70)

## 2024-05-06 LAB — CBC
HCT: 39.1 % (ref 39.0–52.0)
Hemoglobin: 13.5 g/dL (ref 13.0–17.0)
MCH: 32.2 pg (ref 26.0–34.0)
MCHC: 34.5 g/dL (ref 30.0–36.0)
MCV: 93.3 fL (ref 80.0–100.0)
Platelets: 142 K/uL — ABNORMAL LOW (ref 150–400)
RBC: 4.19 MIL/uL — ABNORMAL LOW (ref 4.22–5.81)
RDW: 12.7 % (ref 11.5–15.5)
WBC: 9.1 K/uL (ref 4.0–10.5)
nRBC: 0 % (ref 0.0–0.2)

## 2024-05-06 LAB — MAGNESIUM: Magnesium: 1.7 mg/dL (ref 1.7–2.4)

## 2024-05-06 LAB — PHOSPHORUS: Phosphorus: 2.2 mg/dL — ABNORMAL LOW (ref 2.5–4.6)

## 2024-05-06 MED ORDER — FENTANYL CITRATE PF 50 MCG/ML IJ SOSY
50.0000 ug | PREFILLED_SYRINGE | INTRAMUSCULAR | Status: DC | PRN
Start: 2024-05-06 — End: 2024-05-07
  Administered 2024-05-06 – 2024-05-07 (×9): 50 ug via INTRAVENOUS
  Filled 2024-05-06 (×9): qty 1

## 2024-05-06 MED ORDER — MAGNESIUM SULFATE 2 GM/50ML IV SOLN
2.0000 g | Freq: Once | INTRAVENOUS | Status: AC
  Administered 2024-05-06: 2 g via INTRAVENOUS
  Filled 2024-05-06: qty 50

## 2024-05-06 MED ORDER — POTASSIUM & SODIUM PHOSPHATES 280-160-250 MG PO PACK
2.0000 | PACK | Freq: Three times a day (TID) | ORAL | Status: AC
  Administered 2024-05-06 (×3): 2
  Filled 2024-05-06 (×3): qty 2

## 2024-05-06 MED ORDER — POLYVINYL ALCOHOL 1.4 % OP SOLN
1.0000 [drp] | OPHTHALMIC | Status: DC | PRN
  Administered 2024-05-06 – 2024-05-10 (×3): 1 [drp] via OPHTHALMIC
  Filled 2024-05-06: qty 15

## 2024-05-06 MED ORDER — FUROSEMIDE 10 MG/ML IJ SOLN
40.0000 mg | Freq: Once | INTRAMUSCULAR | Status: AC
  Administered 2024-05-06: 40 mg via INTRAVENOUS
  Filled 2024-05-06: qty 4

## 2024-05-06 MED ORDER — POTASSIUM CHLORIDE 20 MEQ PO PACK
40.0000 meq | PACK | Freq: Once | ORAL | Status: AC
  Administered 2024-05-06: 40 meq
  Filled 2024-05-06: qty 2

## 2024-05-06 MED ORDER — POTASSIUM CHLORIDE CRYS ER 20 MEQ PO TBCR: 40.0000 meq | EXTENDED_RELEASE_TABLET | Freq: Once | ORAL | Status: DC

## 2024-05-06 NOTE — Evaluation (Signed)
 Occupational Therapy Evaluation Patient Details Name: Gregory Shields MRN: [id number] DOB: 1951-03-01 Today's Date: 05/06/2024   History of Present Illness   Pt is a 73 y.o. male presenting 9/30 after being found having questionable seizure by bystander in his back yard. Intubated on arrival for airway protection. 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 MRI 10/1 with acute right pontine infarcts, left thalamic and bilateral cerebellar infarcts,  Right side pontine hemorrhage. MRA with Right vertebral artery occlusion, Proximal occlusion left PCA. EEG with severe diffuse encephalopathy, no seizure 10/2. UDS with THC. 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     Clinical Impressions PTA, pt lived with wife and was independent, working as a Nutritional therapist. Upon eval, pt opens eyes to verbal stim with midline orientation of eyes, no tracking or scanning noted. Pt needing total A for all mobility with active movement noted of [place] this session, needing max-total A for sitting EOB. Observed with intermittent decerebrate posturing during session, not directly correlated with any particular stimuli. Will continue to follow.      If plan is discharge home, recommend the following:   Two people to help with walking and/or transfers;Two people to help with bathing/dressing/bathroom     Functional Status Assessment   Patient has had a recent decline in their functional status and demonstrates the ability to make significant improvements in function in a reasonable and predictable amount of time.     Equipment Recommendations   Other (comment) (defer)     Recommendations for Other Services   Speech consult (once aroused)     Precautions/Restrictions   Precautions Precautions: Other (comment) (intubated, rectal pouch, OG) Recall of Precautions/Restrictions: Impaired Restrictions Weight  Bearing Restrictions Per Provider Order: No     Mobility Bed Mobility Overal bed mobility: Needs Assistance Bed Mobility: Supine to Sit, Sit to Supine     Supine to sit: Total assist, +2 for physical assistance, +2 for safety/equipment Sit to supine: Total assist, +2 for physical assistance, +2 for safety/equipment   General bed mobility comments: via helicopter method; pt with no active participation    Transfers                   General transfer comment: deferred      Balance Overall balance assessment: Needs assistance Sitting-balance support: No upper extremity supported Sitting balance-Leahy Scale: Zero Sitting balance - Comments: total A EOB                                   ADL either performed or assessed with clinical judgement   ADL Overall ADL's : Needs assistance/impaired                                       General ADL Comments: total A at time of eval.     Vision   Vision Assessment?: Vision impaired- to be further tested in functional context Additional Comments: pt unable to follow commands, does open eyes and midline gaze noted, but unable to attempt to track or scan today     Perception Perception: Not tested       Praxis Praxis: Not tested       Pertinent Vitals/Pain Pain Assessment Pain Assessment: CPOT Facial Expression: Relaxed, neutral Body Movements: Absence  of movements Muscle Tension: Tense, rigid Compliance with ventilator (intubated pts.): Coughing but tolerating Vocalization (extubated pts.): N/A CPOT Total: 2     Extremity/Trunk Assessment Upper Extremity Assessment Upper Extremity Assessment: Difficult to assess due to impaired cognition (no active movement; withdrawals to painful stimuli.)   Lower Extremity Assessment Lower Extremity Assessment: Defer to PT evaluation   Cervical / Trunk Assessment Cervical / Trunk Assessment: Kyphotic   Communication  Communication Communication: Impaired Factors Affecting Communication: Trach/intubated   Cognition Arousal: Obtunded, Stuporous Behavior During Therapy: Flat affect Cognition: Difficult to assess Difficult to assess due to: Intubated (orally intubated)           OT - Cognition Comments: questionably followed commands to blink eyes ~3x but inconsistent                 Following commands: Impaired Following commands impaired: Follows one step commands inconsistently (predominantly not following commands)     Cueing  General Comments   Cueing Techniques: Verbal cues;Gestural cues;Tactile cues  VSS during session. at EOB, pt with some decerebrate posturing as well as in bed throughout session. does withdrawal to pain all 4 extremities.   Exercises     Shoulder Instructions      Home Living Family/patient expects to be discharged to:: Private residence                                 Additional Comments: per visitors in room (friends), pt lived with wife who is a wheelchair user in a house.      Prior Functioning/Environment Prior Level of Function : Independent/Modified Independent               ADLs Comments: per friends in room, pt independent, works as a Programmer, multimedia Problem List: Decreased strength;Decreased activity tolerance;Impaired balance (sitting and/or standing);Impaired vision/perception;Decreased cognition;Decreased safety awareness;Decreased knowledge of use of DME or AE;Impaired sensation   OT Treatment/Interventions: Self-care/ADL training;Therapeutic exercise;DME and/or AE instruction;Therapeutic activities;Patient/family education;Balance training      OT Goals(Current goals can be found in the care plan section)   Acute Rehab OT Goals OT Goal Formulation: Patient unable to participate in goal setting Time For Goal Achievement: 05/20/24 Potential to Achieve Goals: Fair   OT Frequency:  Min 1X/week    Co-evaluation  PT/OT/SLP Co-Evaluation/Treatment: Yes Reason for Co-Treatment: Complexity of the patient's impairments (multi-system involvement);For patient/therapist safety;To address functional/ADL transfers PT goals addressed during session: Mobility/safety with mobility;Balance OT goals addressed during session: ADL's and self-care;Strengthening/ROM      AM-PAC OT 6 Clicks Daily Activity     Outcome Measure Help from another person eating meals?: Total Help from another person taking care of personal grooming?: Total Help from another person toileting, which includes using toliet, bedpan, or urinal?: Total Help from another person bathing (including washing, rinsing, drying)?: Total Help from another person to put on and taking off regular upper body clothing?: Total Help from another person to put on and taking off regular lower body clothing?: Total 6 Click Score: 6   End of Session Equipment Utilized During Treatment: Oxygen (intubated) Nurse Communication: Mobility status  Activity Tolerance: Patient tolerated treatment well Patient left: in bed;with call bell/phone within reach;with bed alarm set  OT Visit Diagnosis: Unsteadiness on feet (R26.81);Muscle weakness (generalized) (M62.81);Other symptoms and signs involving cognitive function                Time: 1420-1440 OT  Time Calculation (min): 20 min Charges:  OT General Charges $OT Visit: 1 Visit OT Evaluation $OT Eval Moderate Complexity: 1 Mod  Elma JONETTA Lebron FREDERICK, OTR/L [facility] Acute Rehabilitation Office: [phone]   Elma JONETTA Lebron 05/06/2024, 3:10 PM

## 2024-05-06 NOTE — Evaluation (Signed)
 Physical Therapy Evaluation Patient Details Name: Gregory Shields MRN: [id number] DOB: 05/20/1951 Today's Date: 05/06/2024  History of Present Illness  Pt is a 73 y.o. male presenting 9/30 after being found having questionable seizure by bystander in his back yard. Intubated on arrival for airway protection. 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 MRI 10/1 with acute right pontine infarcts, left thalamic and bilateral cerebellar infarcts,  Right side pontine hemorrhage. MRA with Right vertebral artery occlusion, Proximal occlusion left PCA. EEG with severe diffuse encephalopathy, no seizure 10/2. UDS with THC. 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   Clinical Impression  Pt presents with condition above and deficits mentioned below, see PT Problem List. PTA, he was independent without DME, working as a Nutritional therapist, and living with his wife in a house. Currently, the pt is requiring total assist x2 for all bed mobility and sitting balance. No active movement noted by pt throughout body except to open his eyes briefly to voice and his name being called. Pt kept a midline gaze and was unable to track bil. The pt did withdraw to noxious stimuli in all x4 extremities. He also was noted to have decerebrate posturing spontaneously during the session to no known specific stimuli. At this time, pt could benefit from PT at a [facility]. If he progresses quickly, then may be able to consider intensive inpatient rehab, > 3 hours/day. Will continue to follow acutely.      If plan is discharge home, recommend the following: Two people to help with walking and/or transfers;Two people to help with bathing/dressing/bathroom;Assistance with cooking/housework;Assistance with feeding;Direct supervision/assist for medications management;Direct supervision/assist for financial management;Assist for transportation;Help with stairs  or ramp for entrance;Supervision due to cognitive status   Can travel by private vehicle        Equipment Recommendations [place] lift;Hospital bed;Wheelchair (measurements PT);Wheelchair cushion (measurements PT) (roho cushion; air mattress)  Recommendations for Other Services       Functional Status Assessment Patient has had a recent decline in their functional status and demonstrates the ability to make significant improvements in function in a reasonable and predictable amount of time.     Precautions / Restrictions Precautions Precautions: Other (comment);Fall Recall of Precautions/Restrictions: Impaired Precaution/Restrictions Comments: intubated, rectal pouch, OG Restrictions Weight Bearing Restrictions Per Provider Order: No      Mobility  Bed Mobility Overal bed mobility: Needs Assistance Bed Mobility: Supine to Sit, Sit to Supine     Supine to sit: Total assist, +2 for physical assistance, +2 for safety/equipment Sit to supine: Total assist, +2 for physical assistance, +2 for safety/equipment   General bed mobility comments: via helicopter method; pt with no active participation    Transfers                   General transfer comment: deferred    Ambulation/Gait               General Gait Details: deferred  Stairs            Wheelchair Mobility     Tilt Bed    Modified Rankin (Stroke Patients Only) Modified Rankin (Stroke Patients Only) Pre-Morbid Rankin Score: No symptoms Modified Rankin: Severe disability     Balance Overall balance assessment: Needs assistance Sitting-balance support: No upper extremity supported Sitting balance-Leahy Scale: Zero Sitting balance - Comments: total A EOB, needing assist for head control also  Standing balance comment: deferred                             Pertinent Vitals/Pain Pain Assessment Pain Assessment: Faces Faces Pain Scale: Hurts a little bit Pain Location:  grimacing with decerebrate posturing Pain Descriptors / Indicators: Discomfort, Grimacing, Guarding Pain Intervention(s): Limited activity within patient's tolerance, Monitored during session, Repositioned    Home Living Family/patient expects to be discharged to:: Private residence Living Arrangements: Spouse/significant other Available Help at Discharge: Family Type of Home: House             Additional Comments: per visitors in room (friends), pt lived with wife who is a wheelchair user in a house. Did not know other home info and pt unable to provide    Prior Function Prior Level of Function : Independent/Modified Independent               ADLs Comments: per friends in room, pt independent, works as a Building surveyor Extremity Assessment Upper Extremity Assessment: Defer to OT evaluation    Lower Extremity Assessment Lower Extremity Assessment: RLE deficits/detail;LLE deficits/detail RLE Deficits / Details: withdrew to noxious stimuli but otherwise no active movement noted; decerebrate posturing noted LLE Deficits / Details: withdrew to noxious stimuli but otherwise no active movement noted; decerebrate posturing noted    Cervical / Trunk Assessment Cervical / Trunk Assessment: Kyphotic  Communication   Communication Communication: Impaired Factors Affecting Communication: Trach/intubated    Cognition Arousal: Obtunded Behavior During Therapy: Flat affect   PT - Cognitive impairments: Attention, Initiation                       PT - Cognition Comments: Pt opened his eyes briefly to voices and his name being called. Pt able to tightly shut his eyes a few times to cues, trying to develop x1 blink to answer 'yes' and x2 to answer 'no', but pt quickly lost attention and fell back asleep often. No noted active movement otherwise, unsure if due to cognition or due to inability to move. Decerebrate posturing noted with various  stimuli/mobility. Following commands: Impaired Following commands impaired: Follows one step commands inconsistently (predominantly not following commands)     Cueing Cueing Techniques: Verbal cues, Gestural cues, Tactile cues     General Comments General comments (skin integrity, edema, etc.): VSS intubated 40% PEEP 5 during session. at EOB, pt with some decerebrate posturing as well as in bed throughout session. does withdrawal to pain all 4 extremities.    Exercises     Assessment/Plan    PT Assessment Patient needs continued PT services  PT Problem List Decreased strength;Decreased activity tolerance;Decreased balance;Decreased mobility;Decreased cognition;Decreased coordination;Decreased knowledge of use of DME;Decreased safety awareness;Decreased knowledge of precautions;Cardiopulmonary status limiting activity       PT Treatment Interventions DME instruction;Gait training;Functional mobility training;Therapeutic activities;Therapeutic exercise;Balance training;Neuromuscular re-education;Cognitive remediation;Patient/family education;Wheelchair mobility training    PT Goals (Current goals can be found in the Care Plan section)  Acute Rehab PT Goals Patient Stated Goal: unable to state PT Goal Formulation: Patient unable to participate in goal setting Time For Goal Achievement: 05/20/24 Potential to Achieve Goals: Fair    Frequency Min 3X/week     Co-evaluation   Reason for Co-Treatment: Complexity of the patient's impairments (multi-system involvement);For patient/therapist safety;To address functional/ADL transfers PT goals addressed during session: Mobility/safety with mobility;Balance OT goals addressed during session:  ADL's and self-care;Strengthening/ROM       AM-PAC PT 6 Clicks Mobility  Outcome Measure Help needed turning from your back to your side while in a flat bed without using bedrails?: Total Help needed moving from lying on your back to sitting on  the side of a flat bed without using bedrails?: Total Help needed moving to and from a bed to a chair (including a wheelchair)?: Total Help needed standing up from a chair using your arms (e.g., wheelchair or bedside chair)?: Total Help needed to walk in hospital room?: Total Help needed climbing 3-5 steps with a railing? : Total 6 Click Score: 6    End of Session Equipment Utilized During Treatment: Oxygen Activity Tolerance: Patient limited by lethargy Patient left: in bed;with call bell/phone within reach;with bed alarm set;with family/visitor present Nurse Communication: Mobility status;Other (comment) (decerebrate posturing noted; request for PRAFOs) PT Visit Diagnosis: Muscle weakness (generalized) (M62.81);Difficulty in walking, not elsewhere classified (R26.2);Other symptoms and signs involving the nervous system (R29.898)    Time: 1420-1440 PT Time Calculation (min) (ACUTE ONLY): 20 min   Charges:   PT Evaluation $PT Eval Moderate Complexity: 1 Mod   PT General Charges $$ ACUTE PT VISIT: 1 Visit         Theo Ferretti, PT, DPT Acute Rehabilitation Services  Office: [phone]   Theo CHRISTELLA Ferretti 05/06/2024, 5:34 PM

## 2024-05-06 NOTE — Progress Notes (Signed)
 PHARMACY - ANTICOAGULATION CONSULT NOTE  Pharmacy Consult for heparin  Indication: atrial fibrillation  Allergies  Allergen Reactions   Other Itching and Other (See Comments)    Pollen- Itchy eyes, sneezing, runny nose, etc..   Penicillins Other (See Comments)    Doesn't remember exact allergic reaction    Patient Measurements: Height: 5' 8 (172.7 cm) Weight: 80.3 kg (177 lb 0.5 oz) IBW/kg (Calculated) : 68.4  Vital Signs: Temp: 100.2 F (37.9 C) (10/04 0800) Temp Source: Axillary (10/04 0800) BP: 116/70 (10/04 0600) Pulse Rate: 100 (10/04 0600)  Labs: Recent Labs    05/03/24 1150 05/04/24 0539 05/05/24 0516 05/05/24 1946 05/06/24 0909  HGB 15.1 14.5 13.0  --  13.5  HCT 43.6 42.1 37.6*  --  39.1  PLT 184 162 129*  --  142*  HEPARINUNFRC  --   --   --  0.14* 0.14*  CREATININE 1.02 0.99 0.82  --   --     Estimated Creatinine Clearance: 81.1 mL/min (by C-G formula based on SCr of 0.82 mg/dL).   Medical History: Past Medical History:  Diagnosis Date   Coronary artery disease    Depression    Hypertension    Myocardial infarction [facility])    Sleep apnea    Stroke [facility])       Assessment: 73 yo M with hx LV mural thrombus 2015 and Afib (CHADS2VASc = 6). No anticoagulation prior to admission. Pharmacy consulted for heparin .    Heparin  level 0.14 is subtherapeutic on 1200 units/hr. No issues with infusion or bleeding per RN.  Goal of Therapy:  Heparin  level 0.3-0.5 units/ml Monitor platelets by anticoagulation protocol: Yes   Plan:  Increase Heparin  to 1400 units/hr, no bolus F/u 8hr heparin  level Monitor daily heparin  level, CBC, signs/symptoms of bleeding   Jinnie Door, PharmD, BCPS, BCCP Clinical Pharmacist  Please check AMION for all [facility] Pharmacy phone numbers After 10:00 PM, call Main Pharmacy [phone]

## 2024-05-06 NOTE — Progress Notes (Addendum)
 STROKE TEAM PROGRESS NOTE    SIGNIFICANT HOSPITAL EVENTS  9/30:    Presented after witnessed seizure by neighbor and EMS.              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   INTERIM HISTORY/SUBJECTIVE No family at the bedside.  RN is at the bedside.  Patient remains intubated on no sedation.  He is on heparin  drip.  He is weaning on the ventilator currently.  He intermittently follows commands otherwise neurological exam remains the same and unchanged Vitals and labs are stable No family at the bedside  CBC    Component Value Date/Time   WBC 9.1 05/06/2024 0909   RBC 4.19 (L) 05/06/2024 0909   HGB 13.5 05/06/2024 0909   HCT 39.1 05/06/2024 0909   PLT 142 (L) 05/06/2024 0909   MCV 93.3 05/06/2024 0909   MCH 32.2 05/06/2024 0909   MCHC 34.5 05/06/2024 0909   RDW 12.7 05/06/2024 0909   LYMPHSABS 1.0 05/02/2024 1851   MONOABS 0.5 05/02/2024 1851   EOSABS 0.0 05/02/2024 1851   BASOSABS 0.0 05/02/2024 1851    BMET    Component Value Date/Time   NA 141 05/05/2024 0516   NA 139 06/21/2020 1650   K 3.7 05/05/2024 0516   CL 110 05/05/2024 0516   CO2 23 05/05/2024 0516   GLUCOSE 129 (H) 05/05/2024 0516   BUN 12 05/05/2024 0516   BUN 13 06/21/2020 1650   CREATININE 0.82 05/05/2024 0516   CALCIUM  7.9 (L) 05/05/2024 0516   GFRNONAA >60 05/05/2024 0516    IMAGING past 24 hours No results found.   Vitals:   05/06/24 0400 05/06/24 0500 05/06/24 0600 05/06/24 0800  BP: 134/89 (!) 148/81 116/70   Pulse: (!) 112 (!) 108 100   Resp: (!) 21 (!) 23 (!) 22   Temp: 98.7 F (37.1 C)   100.2 F (37.9 C)  TempSrc: Axillary   Axillary  SpO2: 98% 99% 97%   Weight:  80.3 kg    Height:         PHYSICAL EXAM General:  critically ill intubated  elderly male  Psych:  Mood and affect appropriate for situation CV: Regular rate and rhythm on monitor Respiratory:  Regular, unlabored respirations on ventilator  GI: Abdomen soft and nontender   NEURO:  Mental Status:  Patient is intubated,on no sedation. Eyes are open  ? Possibly following simple commands intermittently   Cranial Nerves:  II: PERRL. Blinks bilaterally  III, IV, VI: With passive eye raise, conjugate gaze V, VII: ETT in place. Corneal reflex intact, greater on right IX, X: Cough and gag reflexes intact Motor/sensory:  moves right side spontaneously and purposeful, left leg is semi purposeful, Strong withdrawal seen RLE.  Coordination: Unable to assess Gait- deferred   Most Recent NIH 23   ASSESSMENT/PLAN Mr. Exavior Kimmons is a 73 y.o. 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 Now s/p TICI 2B. Admitted for ICU monitoring and stroke workup. NIH on Admission: 28.   Stroke: posterior circulation infarcts with BA occlusion s/p IR with TICI 2B, etiology likely Afib  not on AC  CT head No acute abnormality.  Possible remote infarct right corona radiata, extending into basal ganglia.  Small remote infarct right cerebellum. CTA head & neck with perfusion Positive CTA for acute basilar thrombosis.  Right SCA possibly occluded as it is not visualized.  Occlusion of left PCA at proximal left P2 segment.  Occlusion of r hide ight vertebral artery at origin, remains occluded at vertebral basilar junction. Post IR CT head No evidence of acute infarct or intracranial hemorrhage MRI: Acute right pontine infarcts, left thalamic and bilateral cerebellar infarcts. Right side pontine hemorrhage MRA  Right vertebral artery occlusion, Proximal occlusion left PCA Repeat CT head 10/3 2D Echo EF 40-45% LDL 124 HgbA1c 4.5 UDS + THC   VTE prophylaxis -IV heparin   aspirin  325 mg daily Eliquis  (noncompliant) prior to admission,on IV heparin   Therapy recommendations:  Pending Disposition:  pending   Acute respiratory failure Possible ASp Pneumonia  Decreased ability to protect airway secondary to multiple acute infarcts CCM management, appreciate assistance Mental status precluding extubation at this time  On unasyn    Seizures vs. Posturing  Likely posturing secondary to brainstem stroke, low concern for seizure. Status post Keppra 4.25 g load 9/30 Continue Keppra maintenance dose of 1 g twice daily LTM EEG negative for seizures, will discontinue On propofol, wean as tolerated. OK to transition to Precedex.    Paroxsymal Atrial fibrillation Noted in multiple prior notes is not taking eliquis  due to patient choice. Hx of LV thrombus in 2015 Outpatient cardiology appointment 2021 notes that patient stopped taking his Eliquis  approximately a year ago and is only taking aspirin . 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 Started on Eliquis  at this time as well as aspirin  and Brilinta . Outpatient cardiology appointment 2021 notes that patient stopped taking his Eliquis  approximately a year ago and is only taking aspirin .   Hypertension CHF redused EF Home meds: None Unstable, requiring pressors Phenylephrine off  Blood pressure goal 120-160, per IR   Hyperlipidemia Home meds: None LDL 124, goal < 70 Add Lipitor  40 mg  Continue statin at discharge   Dysphagia Patient has post-stroke dysphagia, SLP consulted On TF @ 30   Other Stroke Risk Factors Advanced age greater than/equal to 76 CAD/MI with hx of LV thrombus Obstructive sleep apnea, not on CPAP at home Cardiomyopathy with EF 40-45%   Other Active Problems Leukocytosis 12.2--9.4--12.3--10.8,  Afebrile    Hospital day # 4   Gregory Geralds DNP, ACNPC-AG  [facility] I have personally obtained history,examined this patient, reviewed notes, independently viewed imaging studies, participated in medical decision making and plan of care.ROS completed by me personally and pertinent positives fully documented  I have made any additions or clarifications directly to the above note. Agree with note above.  Patient remains intubated for respiratory failure but appears to be weaning.  Neurological exam remains poor with patient barely opening eyes and barely following any commands.  Prognosis is guarded and will need discussion with family about goals of care.  Discussed with Dr. Harold critical care medicine. This patient is critically ill and at significant risk of neurological worsening, death and care requires constant monitoring of vital signs, hemodynamics,respiratory and cardiac monitoring, extensive review of multiple databases, frequent neurological assessment, discussion with family, other specialists and medical decision making of high complexity.I have made any additions or clarifications  directly to the above note.This critical care time does not reflect procedure time, or teaching time or supervisory time of PA/NP/Med Resident etc but could involve care discussion time.  I spent 30 minutes of neurocritical care time  in the care of  this patient.      Eather Popp, MD Medical Director [facility] Pager: [phone] 05/06/2024 2:57 PM   To contact Stroke Continuity provider, please refer to [email or web address]. After hours, contact General Neurology

## 2024-05-06 NOTE — Progress Notes (Signed)
 eLink Physician-Brief Progress Note Patient Name: Gregory Shields DOB: [birth year] MRN: [id number]   Date of Service  05/06/2024  HPI/Events of Note  Sedation was stopped initially on 10/1 and the patient has been minimally responsive.  Does tolerate support trials but has not been neurologically responsive enough for extubation  Notified that the patient has become more tachypneic, tachycardic, and belly breathing while in bed moving upper and lower extremities.  eICU Interventions  Switched pressure support with improved respiratory mechanics and increased vent tolerance.  Maintain pressure support unless the patient appears fatigued  Switch his fentanyl  as needed bolus from bag to intermittent push.  Favor intermittent analgesia over continuous sedation as his neurological status has not changed substantially     Intervention Category Intermediate Interventions: Respiratory distress - evaluation and management  Gregory Shields 05/06/2024, 3:03 AM

## 2024-05-06 NOTE — Progress Notes (Signed)
 Orthopedic Tech Progress Note Patient Details:  Gregory Shields Jun 18, 1951 [id number]  Ortho Devices Type of Ortho Device: Prafo boot/shoe Ortho Device/Splint Location: BLE Ortho Device/Splint Interventions: Application   Post Interventions Patient Tolerated: Well  Massie BRAVO [name] 05/06/2024, 5:51 PM

## 2024-05-06 NOTE — Plan of Care (Signed)
  Problem: Coping: Goal: Will identify appropriate support needs Outcome: Progressing   Problem: Health Behavior/Discharge Planning: Goal: Goals will be collaboratively established with patient/family Outcome: Progressing   Problem: Nutrition: Goal: Dietary intake will improve Outcome: Progressing   Problem: Clinical Measurements: Goal: Ability to maintain clinical measurements within normal limits will improve Outcome: Progressing

## 2024-05-06 NOTE — Progress Notes (Signed)
 PHARMACY - ANTICOAGULATION CONSULT NOTE  Pharmacy Consult for heparin  Indication: atrial fibrillation  Allergies  Allergen Reactions   Other Itching and Other (See Comments)    Pollen- Itchy eyes, sneezing, runny nose, etc..   Penicillins Other (See Comments)    Doesn't remember exact allergic reaction    Patient Measurements: Height: 5' 8 (172.7 cm) Weight: 80.3 kg (177 lb 0.5 oz) IBW/kg (Calculated) : 68.4  Vital Signs: Temp: 99.9 F (37.7 C) (10/04 1600) Temp Source: Axillary (10/04 1600) BP: 160/90 (10/04 1800) Pulse Rate: 87 (10/04 1800)  Labs: Recent Labs    05/04/24 0539 05/05/24 0516 05/05/24 1946 05/06/24 0909 05/06/24 1752  HGB 14.5 13.0  --  13.5  --   HCT 42.1 37.6*  --  39.1  --   PLT 162 129*  --  142*  --   HEPARINUNFRC  --   --  0.14* 0.14* 0.31  CREATININE 0.99 0.82  --   --   --     Estimated Creatinine Clearance: 81.1 mL/min (by C-G formula based on SCr of 0.82 mg/dL).   Medical History: Past Medical History:  Diagnosis Date   Coronary artery disease    Depression    Hypertension    Myocardial infarction [facility])    Sleep apnea    Stroke [facility])       Assessment: 73 yo M with hx LV mural thrombus 2015 and Afib (CHADS2VASc = 6). No anticoagulation prior to admission. Pharmacy consulted for heparin .    Heparin  level 0.14 is subtherapeutic on 1200 units/hr. No issues with infusion or bleeding per RN.  2nd shift update - heparin  level 0.31 is therapeutic after dose increase.   Goal of Therapy:  Heparin  level 0.3-0.5 units/ml Monitor platelets by anticoagulation protocol: Yes   Plan:  Increase heparin  slightly  to 1450 units/hr, no bolus F/u confirmatory 8hr heparin  level in the AM  Monitor daily heparin  level, CBC, signs/symptoms of bleeding   Rankin Sams, PharmD, BCPS, BCCCP Clinical Pharmacist

## 2024-05-06 NOTE — Progress Notes (Addendum)
 Pharmacy Electrolyte Replacement  Recent Labs:  Recent Labs    05/05/24 0516 05/06/24 0909  K 3.7  --   MG 1.9 1.7  PHOS 1.9* 2.2*  CREATININE 0.82  --     Low Critical Values (K </= 2.5, Phos </= 1, Mg </= 1) Present: K = 3.7 with afib and furosemide IV x1 Phos 2.2 Mg 1.7  Plan: K 40 mEq x1  Phos-nak Per Tube 2 packets TID x3  Mg IV 2g   Jinnie Door, PharmD, BCPS, BCCP Clinical Pharmacist  Please check AMION for all [facility] Pharmacy phone numbers After 10:00 PM, call Main Pharmacy [phone]

## 2024-05-06 NOTE — Progress Notes (Signed)
 NAME:  Gregory Shields, MRN:  [id number], DOB:  1950/12/01, LOS: 4 ADMISSION DATE:  05/02/2024, CONSULTATION DATE: 9/30 REFERRING MD: Dr. Sallyann, CHIEF COMPLAINT: Stroke  History of Present Illness:  73 year old male with past medical history as below, which is significant for coronary artery disease, depression, hypertension, sleep apnea, and CVA.  He was in his usual state of health until 9/30. LKW 1530. Found unresponsive in his yard some time later.  There was some concern for seizure-like activity during EMS evaluation and he was administered Versed .  Upon arrival to the emergency department [phone] he was intubated for airway protection.  CT scan showed no acute hemorrhage.  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  He the patient was outside the window for TNK but was taken to IR where he underwent thrombectomy.  Postoperatively he was transferred to the ICU on the mechanical ventilator.  PCCM was consulted.  Pertinent  Medical History   has a past medical history of Coronary artery disease, Depression, Hypertension, Myocardial infarction [facility]), Sleep apnea, and Stroke (HCC).   Significant Hospital Events: Including procedures, antibiotic start and stop dates in addition to other pertinent events   9/30 presented with basilar artery occlusion and underwent mechanical thrombectomy.  Intubated 10/2 -not able to follow commands despite sedation being completely turned down.  Tolerating pressure support ventilation but airway is not patent.  Start tube feeds 10/3 tolerated spontaneous breathing trial, awaiting family discussion, increased respiratory secretions via G-tube  Interim History / Subjective:  Patient became tachypneic/tachycardic and uncomfortable on full support mechanical ventilator, he was placed on pressure support trial with improvement Respiratory culture is growing polymicrobial's Remain afebrile, white count  started trending down after antibiotic therapy  Objective    Blood pressure 116/70, pulse 100, temperature 98.7 F (37.1 C), temperature source Axillary, resp. rate (!) 22, height 5' 8 (1.727 m), weight 80.3 kg, SpO2 97%.    Vent Mode: PSV;CPAP FiO2 (%):  [40 %-50 %] 50 % Set Rate:  [16 bmp] 16 bmp Vt Set:  [530 mL] 530 mL PEEP:  [5 cmH20] 5 cmH20 Pressure Support:  [8 cmH20-10 cmH20] 10 cmH20 Plateau Pressure:  [13 cmH20] 13 cmH20   Intake/Output Summary (Last 24 hours) at 05/06/2024 0824 Last data filed at 05/06/2024 9357 Gross per 24 hour  Intake 4318.03 ml  Output 1200 ml  Net 3118.03 ml   Filed Weights   05/02/24 2318 05/05/24 0500 05/06/24 0500  Weight: 75 kg 78.6 kg 80.3 kg    Examination: General: Crtitically ill-appearing elderly male, orally intubated HEENT: [place]/AT, eyes anicteric.  ETT and OGT in place Neuro: Eyes open, following simple commands plegic right upper extremity and left side  Chest: Bilateral fine crackles, no wheezes or rhonchi Heart: Tachycardic, regular rhythm, no murmurs or gallops Abdomen: Soft, nondistended, bowel sounds present   Labs pending  Patient Lines/Drains/Airways Status     Active Line/Drains/Airways     Name Placement date Placement time Site Days   Peripheral IV 05/04/24 20 G 1 Anterior;Right Forearm 05/04/24  1029  Forearm  2   Peripheral IV 05/04/24 20 G Left;Posterior Forearm 05/04/24  1929  Forearm  2   NG/OG Vented/Dual Lumen 16 Fr. Oral Marking at nare/corner of mouth 70 cm 05/02/24  1942  Oral  4   External Urinary Catheter 05/04/24  1051  --  2   Airway 7.5 mm 05/02/24  1850  -- 4  Resolved problem list   Assessment and Plan  Acute multifocal ischemic stroke involving bilateral cerebellum, right pons and left thalamus secondary to basilar artery occlusion status post mechanical thrombectomy Patient was not candidate for tPA due to out of window Stroke team is following Continue neuro watch Continue  secondary stroke prophylaxis Continue aspirin  and statin MRI brain confirmed multifocal ischemic strokes and MRA confirmed right vertebral artery and posterior cerebellar artery occlusion Continue PT OT/swallow evaluation postextubation  Chronic HFrEF Echocardiogram showing EF 40 to 45% Will give Lasix 40 mg x 1 GDMT as tolerated  Paroxysmal Atrial fib: Noncompliant with Eliquis  PTA Patient is in sinus rhythm currently Started on IV heparin  infusion for stroke prophylaxis Continue telemetry monitoring  Acute respiratory failure with hypoxia Possible aspiration pneumonia with polymicrobial's Continue lung protective ventilation VAP prevention bundle in place Tolerating spontaneous breathing trial, awaiting family discussion prior to extubation as they have would not want tracheostomy Urine culture is growing polymicrobial's Continue IV Unasyn  Possible seizure EEG has been negative Continue Keppra for now  OSA Currently on full support mechanical ventilation  Hypophosphatemia Continue aggressive electrolyte replacement Repeat labs are pending  Moderate malnutrition Continue tube feeds and dietary supplements    Labs   CBC: Recent Labs  Lab 05/02/24 1851 05/02/24 1908 05/02/24 1956 05/02/24 2146 05/03/24 1150 05/04/24 0539 05/05/24 0516  WBC 12.2*  --   --  9.4 12.3* 10.8* 7.8  NEUTROABS 10.5*  --   --   --   --   --   --   HGB 16.2   < > 14.3 13.4 15.1 14.5 13.0  HCT 46.0   < > 42.0 38.3* 43.6 42.1 37.6*  MCV 92.2  --   --  93.2 94.8 93.8 94.7  PLT 197  --   --  151 184 162 129*   < > = values in this interval not displayed.    Basic Metabolic Panel: Recent Labs  Lab 05/02/24 1851 05/02/24 1908 05/02/24 1951 05/02/24 1956 05/02/24 2146 05/03/24 1150 05/04/24 0539 05/04/24 1109 05/05/24 0516  NA 135 137  --  135  --  136 136  --  141  K 4.2 4.1  --  3.8  --  3.8 3.6  --  3.7  CL 103 102  --   --   --  106 107  --  110  CO2 20*  --   --   --   --   19* 22  --  23  GLUCOSE 123* 123*  --   --   --  91 92  --  129*  BUN 17 19  --   --   --  12 10  --  12  CREATININE 1.03 1.00  --   --  0.87 1.02 0.99  --  0.82  CALCIUM  8.6*  --   --   --   --  8.2* 7.9*  --  7.9*  MG  --   --  1.9  --   --  1.9  --  1.9 1.9  PHOS  --   --   --   --   --   --   --  2.5 1.9*   GFR: Estimated Creatinine Clearance: 81.1 mL/min (by C-G formula based on SCr of 0.82 mg/dL). Recent Labs  Lab 05/02/24 1908 05/02/24 2146 05/03/24 1150 05/04/24 0539 05/05/24 0516  WBC  --  9.4 12.3* 10.8* 7.8  LATICACIDVEN 1.9  --   --   --   --  Liver Function Tests: Recent Labs  Lab 05/02/24 1851  AST 23  ALT 11  ALKPHOS 57  BILITOT 1.3*  PROT 6.5  ALBUMIN 3.9   No results for input(s): LIPASE, AMYLASE in the last 168 hours. No results for input(s): AMMONIA in the last 168 hours.  ABG    Component Value Date/Time   PHART 7.350 05/02/2024 1956   PCO2ART 43.3 05/02/2024 1956   PO2ART 531 (H) 05/02/2024 1956   HCO3 23.9 05/02/2024 1956   TCO2 25 05/02/2024 1956   ACIDBASEDEF 2.0 05/02/2024 1956   O2SAT 100 05/02/2024 1956     Coagulation Profile: No results for input(s): INR, PROTIME in the last 168 hours.  Cardiac Enzymes: No results for input(s): CKTOTAL, CKMB, CKMBINDEX, TROPONINI in the last 168 hours.  HbA1C: Hgb A1c MFr Bld  Date/Time Value Ref Range Status  05/02/2024 09:46 PM 4.5 (L) 4.8 - 5.6 % Final    Comment:    (NOTE) Diagnosis of Diabetes The following HbA1c ranges recommended by the American Diabetes Association (ADA) may be used as an aid in the diagnosis of diabetes mellitus.  Hemoglobin             Suggested A1C NGSP%              Diagnosis  <5.7                   Non Diabetic  5.7-6.4                Pre-Diabetic  >6.4                   Diabetic  <7.0                   Glycemic control for                       adults with diabetes.    05/14/2014 03:08 AM 5.1 <5.7 % Final    Comment:     (NOTE)                                                                       According to the ADA Clinical Practice Recommendations for 2011, when HbA1c is used as a screening test:  >=6.5%   Diagnostic of Diabetes Mellitus           (if abnormal result is confirmed) 5.7-6.4%   Increased risk of developing Diabetes Mellitus References:Diagnosis and Classification of Diabetes Mellitus,Diabetes Care,2011,34(Suppl 1):S62-S69 and Standards of Medical Care in         Diabetes - 2011,Diabetes Care,2011,34 (Suppl 1):S11-S61.    CBG: Recent Labs  Lab 05/05/24 1624 05/05/24 1932 05/05/24 2322 05/06/24 0313 05/06/24 0815  GLUCAP 142* 167* 115* 147* 155*    The patient is critically ill due to Acute bilateral ischemic stroke/acute respiratory failure with hypoxia.  Critical care was necessary to treat or prevent imminent or life-threatening deterioration.  Critical care was time spent personally by me on the following activities: development of treatment plan with patient and/or surrogate as well as nursing, discussions with consultants, evaluation of patient's response to treatment, examination of patient, obtaining history from patient or surrogate, ordering and performing treatments  and interventions, ordering and review of laboratory studies, ordering and review of radiographic studies, pulse oximetry, re-evaluation of patient's condition and participation in multidisciplinary rounds.   During this encounter critical care time was devoted to patient care services described in this note for 38 minutes.     Valinda Novas, MD [place] See Amion for pager If no response to pager, please call [phone] until 7pm After 7pm, Please call E-link [phone]

## 2024-05-06 NOTE — Progress Notes (Signed)
   05/06/24 0800  Daily Weaning Assessment  Daily Assessment of Readiness to Wean Wean protocol criteria met (SBT performed)  SBT Method CPAP 5 cm H20 and PS 5 cm H20  Weaning Start Time 0330   Pt placed on PS/CPAP mode at 10/4 0330 and is still tolerating well at this time. Pt PS dropped to 8 from 10 due to good Vte.

## 2024-05-07 DIAGNOSIS — I6322 Cerebral infarction due to unspecified occlusion or stenosis of basilar arteries: Secondary | ICD-10-CM | POA: Diagnosis not present

## 2024-05-07 DIAGNOSIS — J9601 Acute respiratory failure with hypoxia: Secondary | ICD-10-CM | POA: Diagnosis not present

## 2024-05-07 DIAGNOSIS — I63543 Cerebral infarction due to unspecified occlusion or stenosis of bilateral cerebellar arteries: Secondary | ICD-10-CM | POA: Diagnosis not present

## 2024-05-07 DIAGNOSIS — I480 Paroxysmal atrial fibrillation: Secondary | ICD-10-CM | POA: Diagnosis not present

## 2024-05-07 DIAGNOSIS — I5022 Chronic systolic (congestive) heart failure: Secondary | ICD-10-CM | POA: Diagnosis not present

## 2024-05-07 DIAGNOSIS — I6329 Cerebral infarction due to unspecified occlusion or stenosis of other precerebral arteries: Secondary | ICD-10-CM | POA: Diagnosis not present

## 2024-05-07 DIAGNOSIS — I613 Nontraumatic intracerebral hemorrhage in brain stem: Secondary | ICD-10-CM | POA: Diagnosis not present

## 2024-05-07 LAB — CBC
HCT: 40.4 % (ref 39.0–52.0)
Hemoglobin: 14 g/dL (ref 13.0–17.0)
MCH: 32.6 pg (ref 26.0–34.0)
MCHC: 34.7 g/dL (ref 30.0–36.0)
MCV: 94 fL (ref 80.0–100.0)
Platelets: 145 K/uL — ABNORMAL LOW (ref 150–400)
RBC: 4.3 MIL/uL (ref 4.22–5.81)
RDW: 12.6 % (ref 11.5–15.5)
WBC: 9 K/uL (ref 4.0–10.5)
nRBC: 0 % (ref 0.0–0.2)

## 2024-05-07 LAB — GLUCOSE, CAPILLARY
Glucose-Capillary: 153 mg/dL — ABNORMAL HIGH (ref 70–99)
Glucose-Capillary: 163 mg/dL — ABNORMAL HIGH (ref 70–99)
Glucose-Capillary: 147 mg/dL — ABNORMAL HIGH (ref 70–99)
Glucose-Capillary: 174 mg/dL — ABNORMAL HIGH (ref 70–99)
Glucose-Capillary: 132 mg/dL — ABNORMAL HIGH (ref 70–99)
Glucose-Capillary: 159 mg/dL — ABNORMAL HIGH (ref 70–99)

## 2024-05-07 LAB — PHOSPHORUS: Phosphorus: 2.9 mg/dL (ref 2.5–4.6)

## 2024-05-07 LAB — MAGNESIUM: Magnesium: 2.1 mg/dL (ref 1.7–2.4)

## 2024-05-07 LAB — HEPARIN LEVEL (UNFRACTIONATED): Heparin Unfractionated: 0.33 [IU]/mL (ref 0.30–0.70)

## 2024-05-07 MED ORDER — FUROSEMIDE 10 MG/ML IJ SOLN
40.0000 mg | Freq: Once | INTRAMUSCULAR | Status: AC
Start: 2024-05-07 — End: 2024-05-07
  Administered 2024-05-07: 40 mg via INTRAVENOUS
  Filled 2024-05-07: qty 4

## 2024-05-07 MED ORDER — FENTANYL 2500MCG IN NS 250ML (10MCG/ML) PREMIX INFUSION
0.0000 ug/h | INTRAVENOUS | Status: DC
  Administered 2024-05-07: 50 ug/h via INTRAVENOUS
  Filled 2024-05-07: qty 250

## 2024-05-07 MED ORDER — SODIUM CHLORIDE 0.9 % IV SOLN
2.0000 g | INTRAVENOUS | Status: DC
  Administered 2024-05-07: 2 g via INTRAVENOUS
  Filled 2024-05-07: qty 20

## 2024-05-07 MED ORDER — FENTANYL CITRATE PF 50 MCG/ML IJ SOSY: 25.0000 ug | PREFILLED_SYRINGE | Freq: Once | INTRAMUSCULAR | Status: DC

## 2024-05-07 MED ORDER — FENTANYL BOLUS VIA INFUSION
25.0000 ug | INTRAVENOUS | Status: DC | PRN
  Administered 2024-05-07 – 2024-05-08 (×2): 50 ug via INTRAVENOUS

## 2024-05-07 MED ORDER — POLYETHYLENE GLYCOL 3350 17 G PO PACK
17.0000 g | PACK | Freq: Every day | ORAL | Status: DC
  Administered 2024-05-07 – 2024-05-08 (×2): 17 g
  Filled 2024-05-07 (×2): qty 1

## 2024-05-07 MED ORDER — DOCUSATE SODIUM 50 MG/5ML PO LIQD
100.0000 mg | Freq: Two times a day (BID) | ORAL | Status: DC
  Administered 2024-05-07 – 2024-05-11 (×6): 100 mg
  Filled 2024-05-07 (×6): qty 10

## 2024-05-07 NOTE — Progress Notes (Signed)
 05/07/2024 Repeated high CPOT scores despite fent pushes, okay for gtt.  Rolan Sharps MD PCCM

## 2024-05-07 NOTE — Progress Notes (Signed)
 PHARMACY - ANTICOAGULATION CONSULT NOTE  Pharmacy Consult for heparin  Indication: atrial fibrillation  Allergies  Allergen Reactions   Other Itching and Other (See Comments)    Pollen- Itchy eyes, sneezing, runny nose, etc..   Penicillins Other (See Comments)    Doesn't remember exact allergic reaction    Patient Measurements: Height: 5' 8 (172.7 cm) Weight: 79.1 kg (174 lb 6.1 oz) IBW/kg (Calculated) : 68.4  Vital Signs: Temp: 100.6 F (38.1 C) (10/05 0400) Temp Source: Axillary (10/05 0400) BP: 154/93 (10/05 0700) Pulse Rate: 96 (10/05 0700)  Labs: Recent Labs    05/05/24 0516 05/05/24 1946 05/06/24 0909 05/06/24 1752 05/07/24 0419  HGB 13.0  --  13.5  --  14.0  HCT 37.6*  --  39.1  --  40.4  PLT 129*  --  142*  --  145*  HEPARINUNFRC  --    < > 0.14* 0.31 0.33  CREATININE 0.82  --   --   --   --    < > = values in this interval not displayed.    Estimated Creatinine Clearance: 81.1 mL/min (by C-G formula based on SCr of 0.82 mg/dL).   Medical History: Past Medical History:  Diagnosis Date   Coronary artery disease    Depression    Hypertension    Myocardial infarction [facility])    Sleep apnea    Stroke [facility])       Assessment: 73 yo M with hx LV mural thrombus 2015 and Afib (CHADS2VASc = 6). No anticoagulation prior to admission. Pharmacy consulted for heparin .    Heparin  level 0.33 is at low end of therapeutic on 1200 units/hr.   Goal of Therapy:  Heparin  level 0.3-0.5 units/ml Monitor platelets by anticoagulation protocol: Yes   Plan:  Increase heparin  slightly to 1500 units/hr, no bolus  Monitor daily heparin  level, CBC, signs/symptoms of bleeding    Jinnie Door, PharmD, BCPS, BCCP Clinical Pharmacist  Please check AMION for all [facility] Pharmacy phone numbers After 10:00 PM, call Main Pharmacy [phone]

## 2024-05-07 NOTE — Progress Notes (Addendum)
 STROKE TEAM PROGRESS NOTE    SIGNIFICANT HOSPITAL EVENTS  9/30:    Presented after witnessed seizure by neighbor and EMS.              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   INTERIM HISTORY/SUBJECTIVE No family at the bedside.  No new neurological events overnight.  Remains on heparin  drip.  Remains intubated for respiratory failure but appears to be weaning.  Follows only occasional commands but not consistently.  CBC    Component Value Date/Time   WBC 9.0 05/07/2024 0419   RBC 4.30 05/07/2024 0419   HGB 14.0 05/07/2024 0419   HCT 40.4 05/07/2024 0419   PLT 145 (L) 05/07/2024 0419   MCV 94.0 05/07/2024 0419   MCH 32.6 05/07/2024 0419   MCHC 34.7 05/07/2024 0419   RDW 12.6 05/07/2024 0419   LYMPHSABS 1.0 05/02/2024 1851   MONOABS 0.5 05/02/2024 1851   EOSABS 0.0 05/02/2024 1851   BASOSABS 0.0 05/02/2024 1851    BMET    Component Value Date/Time   NA 141 05/05/2024 0516   NA 139 06/21/2020 1650   K 3.7 05/05/2024 0516   CL 110 05/05/2024 0516   CO2 23 05/05/2024 0516   GLUCOSE 129 (H) 05/05/2024 0516   BUN 12 05/05/2024 0516   BUN 13 06/21/2020 1650   CREATININE 0.82 05/05/2024 0516   CALCIUM  7.9 (L) 05/05/2024 0516   GFRNONAA >60 05/05/2024 0516    IMAGING past 24 hours No results found.   Vitals:   05/07/24 0900 05/07/24 1000 05/07/24 1100 05/07/24 1122  BP: (!) 158/109 (!) 166/100 (!) 147/87 (!) 171/97  Pulse: 98 88 (!) 112 (!) 109  Resp: (!) 21 (!) 21 (!) 24 (!) 24  Temp:    99.4 F (37.4 C)  TempSrc:    Axillary  SpO2: 97% 99% 98% 99%  Weight:      Height:         PHYSICAL EXAM General:  critically ill intubated elderly male  Psych:  Mood and affect appropriate for situation CV: Regular rate and rhythm on  monitor Respiratory:  Regular, unlabored respirations on ventilator  GI: Abdomen soft and nontender   NEURO:  Mental Status:  Patient is intubated,on no sedation. Eyes are open  ? Possibly following simple commands intermittently   Cranial Nerves:  II: PERRL. Blinks bilaterally  III, IV, VI: With passive eye raise, conjugate gaze V, VII: ETT in place. Corneal reflex intact, greater on right IX, X: Cough and gag reflexes intact Motor/sensory:  moves right side spontaneously and purposeful, left leg is semi purposeful, Strong withdrawal seen RLE.  Coordination: Unable to assess Gait- deferred   Most Recent NIH 23   ASSESSMENT/PLAN [name] is a 73 y.o. 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 Now s/p TICI 2B. Admitted for ICU monitoring and stroke workup. NIH on Admission: 28.   Stroke: posterior circulation infarcts with BA occlusion s/p IR with TICI 2B, etiology likely Afib not on AC  CT head No acute abnormality.  Possible remote infarct right corona radiata, extending into basal ganglia.  Small remote infarct right cerebellum. CTA head & neck with perfusion Positive CTA for acute basilar thrombosis.  Right SCA possibly occluded as it is not visualized.  Occlusion of left PCA at proximal left P2 segment.  Occlusion of r hide ight vertebral artery at origin, remains occluded at vertebral basilar junction. Post IR CT head No evidence of acute infarct or intracranial hemorrhage MRI: Acute right pontine infarcts, left thalamic and bilateral cerebellar infarcts. Right side pontine hemorrhage MRA  Right vertebral artery occlusion, Proximal occlusion left PCA Repeat CT head 10/3 2D Echo EF 40-45% LDL 124 HgbA1c 4.5 UDS + THC  VTE prophylaxis -IV heparin   aspirin  325 mg daily Eliquis  (noncompliant) prior to admission,on IV  heparin   Therapy recommendations:  Pending Disposition:  pending   Acute respiratory failure Possible ASp Pneumonia  Decreased ability to protect airway secondary to multiple acute infarcts CCM management, appreciate assistance Mental status precluding extubation at this time  On unasyn    Seizures vs. Posturing  Likely posturing secondary to brainstem stroke, low concern for seizure. Status post Keppra 4.25 g load 9/30 Continue Keppra maintenance dose of 1 g twice daily LTM EEG negative for seizures, will discontinue On propofol, wean as tolerated. OK to transition to Precedex.    Paroxsymal Atrial fibrillation Noted in multiple prior notes is not taking eliquis  due to patient choice. Hx of LV thrombus in 2015 Outpatient cardiology appointment 2021 notes that patient stopped taking his Eliquis  approximately a year ago and is only taking aspirin . 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 Started on Eliquis  at this time as well as aspirin  and Brilinta . Outpatient cardiology appointment 2021 notes that patient stopped taking his Eliquis  approximately a year ago and is only taking aspirin .   Hypertension CHF reduced EF Home meds: None Unstable, requiring pressors Phenylephrine off  Blood pressure goal >160  Hyperlipidemia Home meds: None LDL 124, goal < 70 Add Lipitor  40 mg  Continue statin at discharge   Dysphagia Patient has post-stroke dysphagia, SLP consulted On TF @ 89   Other Stroke Risk Factors Advanced age greater than/equal to 16 CAD/MI with hx of LV thrombus Obstructive sleep apnea, not on CPAP at home Cardiomyopathy with EF 40-45%   Other Active Problems Leukocytosis 12.2--9.4--12.3--10.8, Afebrile    Hospital day # 5   Gregory Geralds DNP, ACNPC-AG  [facility]  I have personally obtained  history,examined this patient, reviewed notes, independently viewed imaging studies, participated in medical decision making and plan of care.ROS completed by me personally and pertinent positives fully documented  I have made any additions or clarifications directly to the above note. Agree with note above.  Patient remains intubated but appears to be weaning in breathing by himself.  He is awake but not following commands consistently and moves all 4 extremities.  No family at the bedside.  I had a long discussion over the phone with the patient's mother about his prognosis and discussed goals of care.  Wife is not sure what patient would want prolonged ventilatory support tracheostomy or PEG tube.  I recommend discussion with critical care team to make decision about one-way extubation versus continuing aggressive ventilatory support.  Discussed with Dr. Candyce the critical care medicine. This patient is critically ill and at significant risk of neurological worsening, death and care requires constant monitoring of vital signs, hemodynamics,respiratory and cardiac monitoring,  extensive review of multiple databases, frequent neurological assessment, discussion with family, other specialists and medical decision making of high complexity.I have made any additions or clarifications directly to the above note.This critical care time does not reflect procedure time, or teaching time or supervisory time of PA/NP/Med Resident etc but could involve care discussion time.  I spent 50 minutes of neurocritical care time  in the care of  this patient.     Eather Popp, MD Medical Director [facility] Pager: [phone] 05/07/2024 12:59 PM  To contact Stroke Continuity provider, please refer to [email or web address]. After hours, contact General Neurology

## 2024-05-07 NOTE — Progress Notes (Signed)
 NAME:  Gregory Shields, MRN:  [id number], DOB:  Dec 25, 1950, LOS: 5 ADMISSION DATE:  05/02/2024, CONSULTATION DATE: 9/30 REFERRING MD: Dr. Sallyann, CHIEF COMPLAINT: Stroke  History of Present Illness:  73 year old male with past medical history as below, which is significant for coronary artery disease, depression, hypertension, sleep apnea, and CVA.  He was in his usual state of health until 9/30. LKW 1530. Found unresponsive in his yard some time later.  There was some concern for seizure-like activity during EMS evaluation and he was administered Versed .  Upon arrival to the emergency department [phone] he was intubated for airway protection.  CT scan showed no acute hemorrhage.  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  He the patient was outside the window for TNK but was taken to IR where he underwent thrombectomy.  Postoperatively he was transferred to the ICU on the mechanical ventilator.  PCCM was consulted.  Pertinent  Medical History   has a past medical history of Coronary artery disease, Depression, Hypertension, Myocardial infarction [facility]), Sleep apnea, and Stroke (HCC).   Significant Hospital Events: Including procedures, antibiotic start and stop dates in addition to other pertinent events   9/30 presented with basilar artery occlusion and underwent mechanical thrombectomy.  Intubated 10/2 -not able to follow commands despite sedation being completely turned down.  Tolerating pressure support ventilation but airway is not patent.  Start tube feeds 10/3 tolerated spontaneous breathing trial, awaiting family discussion, increased respiratory secretions via G-tube 10/4 patient was tachypneic/tachycardic and uncomfortable overnight on.  On mechanical ventilation, he was switched to pressure support, tolerated well  Interim History / Subjective:  Patient spiked fever overnight with Tmax 100.6 Remain on pressure support  trial, tolerating well, awaiting family discussion prior to extubation Respiratory culture is growing pansensitive Klebsiella  Objective    Blood pressure (!) 158/109, pulse 98, temperature 99.2 F (37.3 C), temperature source Axillary, resp. rate (!) 21, height 5' 8 (1.727 m), weight 79.1 kg, SpO2 97%.    Vent Mode: PSV;CPAP FiO2 (%):  [40 %] 40 % Vt Set:  [530 mL] 530 mL PEEP:  [5 cmH20] 5 cmH20 Pressure Support:  [8 cmH20-10 cmH20] 10 cmH20 Plateau Pressure:  [17 cmH20] 17 cmH20   Intake/Output Summary (Last 24 hours) at 05/07/2024 1007 Last data filed at 05/07/2024 0900 Gross per 24 hour  Intake 2202.6 ml  Output 2225 ml  Net -22.4 ml   Filed Weights   05/05/24 0500 05/06/24 0500 05/07/24 0500  Weight: 78.6 kg 80.3 kg 79.1 kg    Examination: General: Crtitically ill-appearing male, orally intubated HEENT: [place]/AT, eyes anicteric.  ETT and OGT in place.  JVD is elevated Neuro: Eyes open, following simple commands, plegic right upper extremity and left side.  Has disconjugate gaze Chest: Coarse breath sounds, no wheezes or rhonchi Heart: Regular rate and rhythm, no murmurs or gallops Abdomen: Soft, nondistended, bowel sounds present  Labs reviewed  Patient Lines/Drains/Airways Status     Active Line/Drains/Airways     Name Placement date Placement time Site Days   Peripheral IV 05/04/24 20 G 1 Anterior;Right Forearm 05/04/24  1029  Forearm  3   Peripheral IV 05/04/24 20 G Left;Posterior Forearm 05/04/24  1929  Forearm  3   NG/OG Vented/Dual Lumen 16 Fr. Oral Marking at nare/corner of mouth 70 cm 05/02/24  1942  Oral  5   External Urinary Catheter 05/04/24  1051  --  3   Airway  7.5 mm 05/02/24  1850  -- 5        Resolved problem list  Hypophosphatemia  Assessment and Plan  Acute multifocal ischemic stroke involving bilateral cerebellum, right pons and left thalamus secondary to basilar artery occlusion status post mechanical thrombectomy Stroke team is  following Continue neuro watch Continue secondary stroke prophylaxis Continue aspirin  and statin MRI brain confirmed multifocal ischemic strokes and MRA confirmed right vertebral artery and posterior cerebellar artery occlusion Continue PT OT/swallow evaluation postextubation  Chronic HFrEF Echocardiogram showing EF 40 to 45% Patient JVD is elevated, will repeat Lasix 40 mg x 1 Will start GDMT by tomorrow considering keeping blood pressure hide for acute stroke  Paroxysmal Atrial fib: Noncompliant with Eliquis  PTA Patient is in sinus rhythm currently Continue IV heparin  infusion for stroke prophylaxis Continue telemetry monitoring  Acute respiratory failure with hypoxia Possible aspiration pneumonia with pansensitive Klebsiella Continue lung protective ventilation VAP prevention bundle in place Tolerating spontaneous breathing trial, awaiting family discussion prior to extubation as they have would not want tracheostomy Respiratory culture is growing Klebsiella, switch antibiotic to IV ceftriaxone  Possible seizure EEG has been negative Continue Keppra for now  OSA Currently on mechanical ventilation  Moderate malnutrition Continue tube feeds and dietary supplements    Labs   CBC: Recent Labs  Lab 05/02/24 1851 05/02/24 1908 05/03/24 1150 05/04/24 0539 05/05/24 0516 05/06/24 0909 05/07/24 0419  WBC 12.2*   < > 12.3* 10.8* 7.8 9.1 9.0  NEUTROABS 10.5*  --   --   --   --   --   --   HGB 16.2   < > 15.1 14.5 13.0 13.5 14.0  HCT 46.0   < > 43.6 42.1 37.6* 39.1 40.4  MCV 92.2   < > 94.8 93.8 94.7 93.3 94.0  PLT 197   < > 184 162 129* 142* 145*   < > = values in this interval not displayed.    Basic Metabolic Panel: Recent Labs  Lab 05/02/24 1851 05/02/24 1908 05/02/24 1951 05/02/24 1956 05/02/24 2146 05/03/24 1150 05/04/24 0539 05/04/24 1109 05/05/24 0516 05/06/24 0909 05/07/24 0419  NA 135 137  --  135  --  136 136  --  141  --   --   K 4.2 4.1  --   3.8  --  3.8 3.6  --  3.7  --   --   CL 103 102  --   --   --  106 107  --  110  --   --   CO2 20*  --   --   --   --  19* 22  --  23  --   --   GLUCOSE 123* 123*  --   --   --  91 92  --  129*  --   --   BUN 17 19  --   --   --  12 10  --  12  --   --   CREATININE 1.03 1.00  --   --  0.87 1.02 0.99  --  0.82  --   --   CALCIUM  8.6*  --   --   --   --  8.2* 7.9*  --  7.9*  --   --   MG  --   --    < >  --   --  1.9  --  1.9 1.9 1.7 2.1  PHOS  --   --   --   --   --   --   --  2.5 1.9* 2.2* 2.9   < > = values in this interval not displayed.   GFR: Estimated Creatinine Clearance: 81.1 mL/min (by C-G formula based on SCr of 0.82 mg/dL). Recent Labs  Lab 05/02/24 1908 05/02/24 2146 05/04/24 0539 05/05/24 0516 05/06/24 0909 05/07/24 0419  WBC  --    < > 10.8* 7.8 9.1 9.0  LATICACIDVEN 1.9  --   --   --   --   --    < > = values in this interval not displayed.    Liver Function Tests: Recent Labs  Lab 05/02/24 1851  AST 23  ALT 11  ALKPHOS 57  BILITOT 1.3*  PROT 6.5  ALBUMIN 3.9   No results for input(s): LIPASE, AMYLASE in the last 168 hours. No results for input(s): AMMONIA in the last 168 hours.  ABG    Component Value Date/Time   PHART 7.350 05/02/2024 1956   PCO2ART 43.3 05/02/2024 1956   PO2ART 531 (H) 05/02/2024 1956   HCO3 23.9 05/02/2024 1956   TCO2 25 05/02/2024 1956   ACIDBASEDEF 2.0 05/02/2024 1956   O2SAT 100 05/02/2024 1956     Coagulation Profile: No results for input(s): INR, PROTIME in the last 168 hours.  Cardiac Enzymes: No results for input(s): CKTOTAL, CKMB, CKMBINDEX, TROPONINI in the last 168 hours.  HbA1C: Hgb A1c MFr Bld  Date/Time Value Ref Range Status  05/02/2024 09:46 PM 4.5 (L) 4.8 - 5.6 % Final    Comment:    (NOTE) Diagnosis of Diabetes The following HbA1c ranges recommended by the American Diabetes Association (ADA) may be used as an aid in the diagnosis of diabetes mellitus.  Hemoglobin              Suggested A1C NGSP%              Diagnosis  <5.7                   Non Diabetic  5.7-6.4                Pre-Diabetic  >6.4                   Diabetic  <7.0                   Glycemic control for                       adults with diabetes.    05/14/2014 03:08 AM 5.1 <5.7 % Final    Comment:    (NOTE)                                                                       According to the ADA Clinical Practice Recommendations for 2011, when HbA1c is used as a screening test:  >=6.5%   Diagnostic of Diabetes Mellitus           (if abnormal result is confirmed) 5.7-6.4%   Increased risk of developing Diabetes Mellitus References:Diagnosis and Classification of Diabetes Mellitus,Diabetes Care,2011,34(Suppl 1):S62-S69 and Standards of Medical Care in         Diabetes - 2011,Diabetes Care,2011,34 (Suppl 1):S11-S61.    CBG: Recent Labs  Lab 05/06/24 1553  05/06/24 1919 05/06/24 2313 05/07/24 0325 05/07/24 0726  GLUCAP 162* 166* 163* 153* 163*    The patient is critically ill due to Acute bilateral ischemic stroke/acute respiratory failure with hypoxia.  Critical care was necessary to treat or prevent imminent or life-threatening deterioration.  Critical care was time spent personally by me on the following activities: development of treatment plan with patient and/or surrogate as well as nursing, discussions with consultants, evaluation of patient's response to treatment, examination of patient, obtaining history from patient or surrogate, ordering and performing treatments and interventions, ordering and review of laboratory studies, ordering and review of radiographic studies, pulse oximetry, re-evaluation of patient's condition and participation in multidisciplinary rounds.   During this encounter critical care time was devoted to patient care services described in this note for 32 minutes.     Valinda Novas, MD [facility] See Amion for pager If no response  to pager, please call [phone] until 7pm After 7pm, Please call E-link [phone]

## 2024-05-08 DIAGNOSIS — I6322 Cerebral infarction due to unspecified occlusion or stenosis of basilar arteries: Secondary | ICD-10-CM | POA: Diagnosis not present

## 2024-05-08 DIAGNOSIS — J9601 Acute respiratory failure with hypoxia: Secondary | ICD-10-CM | POA: Diagnosis not present

## 2024-05-08 DIAGNOSIS — I613 Nontraumatic intracerebral hemorrhage in brain stem: Secondary | ICD-10-CM | POA: Diagnosis not present

## 2024-05-08 DIAGNOSIS — I5022 Chronic systolic (congestive) heart failure: Secondary | ICD-10-CM | POA: Diagnosis not present

## 2024-05-08 DIAGNOSIS — D696 Thrombocytopenia, unspecified: Secondary | ICD-10-CM

## 2024-05-08 DIAGNOSIS — I63543 Cerebral infarction due to unspecified occlusion or stenosis of bilateral cerebellar arteries: Secondary | ICD-10-CM | POA: Diagnosis not present

## 2024-05-08 DIAGNOSIS — I6329 Cerebral infarction due to unspecified occlusion or stenosis of other precerebral arteries: Secondary | ICD-10-CM | POA: Diagnosis not present

## 2024-05-08 DIAGNOSIS — I480 Paroxysmal atrial fibrillation: Secondary | ICD-10-CM | POA: Diagnosis not present

## 2024-05-08 LAB — CBC
HCT: 38.8 % — ABNORMAL LOW (ref 39.0–52.0)
Hemoglobin: 13 g/dL (ref 13.0–17.0)
MCH: 32.4 pg (ref 26.0–34.0)
MCHC: 33.5 g/dL (ref 30.0–36.0)
MCV: 96.8 fL (ref 80.0–100.0)
Platelets: 142 K/uL — ABNORMAL LOW (ref 150–400)
RBC: 4.01 MIL/uL — ABNORMAL LOW (ref 4.22–5.81)
RDW: 12.8 % (ref 11.5–15.5)
WBC: 8.2 K/uL (ref 4.0–10.5)
nRBC: 0 % (ref 0.0–0.2)

## 2024-05-08 LAB — HEPARIN LEVEL (UNFRACTIONATED): Heparin Unfractionated: 0.35 [IU]/mL (ref 0.30–0.70)

## 2024-05-08 LAB — BASIC METABOLIC PANEL WITH GFR
Anion gap: 8 (ref 5–15)
BUN: 25 mg/dL — ABNORMAL HIGH (ref 8–23)
CO2: 26 mmol/L (ref 22–32)
Calcium: 8.2 mg/dL — ABNORMAL LOW (ref 8.9–10.3)
Chloride: 110 mmol/L (ref 98–111)
Creatinine, Ser: 0.88 mg/dL (ref 0.61–1.24)
GFR, Estimated: >60 mL/min (ref >60)
Glucose, Bld: 141 mg/dL — ABNORMAL HIGH (ref 70–99)
Potassium: 3.6 mmol/L (ref 3.5–5.1)
Sodium: 144 mmol/L (ref 135–145)

## 2024-05-08 LAB — GLUCOSE, CAPILLARY
Glucose-Capillary: 148 mg/dL — ABNORMAL HIGH (ref 70–99)
Glucose-Capillary: 142 mg/dL — ABNORMAL HIGH (ref 70–99)
Glucose-Capillary: 129 mg/dL — ABNORMAL HIGH (ref 70–99)
Glucose-Capillary: 131 mg/dL — ABNORMAL HIGH (ref 70–99)
Glucose-Capillary: 110 mg/dL — ABNORMAL HIGH (ref 70–99)
Glucose-Capillary: 139 mg/dL — ABNORMAL HIGH (ref 70–99)

## 2024-05-08 MED ORDER — POLYETHYLENE GLYCOL 3350 17 G PO PACK
17.0000 g | PACK | ORAL | Status: DC
  Administered 2024-05-10: 17 g
  Filled 2024-05-08: qty 1

## 2024-05-08 MED ORDER — LEVETIRACETAM 500 MG PO TABS
1000.0000 mg | ORAL_TABLET | Freq: Two times a day (BID) | ORAL | Status: DC
  Administered 2024-05-09 – 2024-05-14 (×10): 1000 mg
  Filled 2024-05-08 (×10): qty 2

## 2024-05-08 MED ORDER — LEVOFLOXACIN IN D5W 750 MG/150ML IV SOLN
750.0000 mg | INTRAVENOUS | Status: DC
  Administered 2024-05-08 – 2024-05-11 (×4): 750 mg via INTRAVENOUS
  Filled 2024-05-08 (×4): qty 150

## 2024-05-08 MED ORDER — FUROSEMIDE 10 MG/ML IJ SOLN
40.0000 mg | Freq: Every day | INTRAMUSCULAR | Status: DC
Start: 2024-05-08 — End: 2024-05-09
  Administered 2024-05-08: 40 mg via INTRAVENOUS
  Filled 2024-05-08: qty 4

## 2024-05-08 MED ORDER — SULFAMETHOXAZOLE-TRIMETHOPRIM 400-80 MG/5ML IV SOLN
10.0000 mg/kg/d | Freq: Three times a day (TID) | INTRAVENOUS | Status: DC
  Administered 2024-05-08 – 2024-05-10 (×7): 263.68 mg via INTRAVENOUS
  Filled 2024-05-08 (×9): qty 16.48

## 2024-05-08 MED ORDER — LEVETIRACETAM (KEPPRA) 500 MG/5 ML ADULT IV PUSH
1000.0000 mg | Freq: Two times a day (BID) | INTRAVENOUS | Status: DC
  Administered 2024-05-08 – 2024-05-09 (×2): 1000 mg via INTRAVENOUS
  Filled 2024-05-08 (×4): qty 10

## 2024-05-08 MED ORDER — LEVETIRACETAM 500 MG PO TABS: 1000.0000 mg | ORAL_TABLET | Freq: Two times a day (BID) | ORAL | Status: DC

## 2024-05-08 NOTE — Progress Notes (Signed)
 NAME:  Gregory Shields, MRN:  [id number], DOB:  [birth year], LOS: 6 ADMISSION DATE:  05/02/2024, CONSULTATION DATE: 9/30 REFERRING MD: Dr. Sallyann, CHIEF COMPLAINT: Stroke  History of Present Illness:  73 year old male with past medical history as below, which is significant for coronary artery disease, depression, hypertension, sleep apnea, and CVA.  He was in his usual state of health until 9/30. LKW 1530. Found unresponsive in his yard some time later.  There was some concern for seizure-like activity during EMS evaluation and he was administered Versed .  Upon arrival to the emergency department [phone] he was intubated for airway protection.  CT scan showed no acute hemorrhage.  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  He the patient was outside the window for TNK but was taken to IR where he underwent thrombectomy.  Postoperatively he was transferred to the ICU on the mechanical ventilator.  PCCM was consulted.  Pertinent  Medical History   has a past medical history of Coronary artery disease, Depression, Hypertension, Myocardial infarction [facility]), Sleep apnea, and Stroke (HCC).   Significant Hospital Events: Including procedures, antibiotic start and stop dates in addition to other pertinent events   9/30 presented with basilar artery occlusion and underwent mechanical thrombectomy.  Intubated 10/2 -not able to follow commands despite sedation being completely turned down.  Tolerating pressure support ventilation but airway is not patent.  Start tube feeds 10/3 tolerated spontaneous breathing trial, awaiting family discussion, increased respiratory secretions via G-tube 10/4 patient was tachypneic/tachycardic and uncomfortable overnight on.  On mechanical ventilation, he was switched to pressure support, tolerated well 10/5 patient spiked fever with Tmax 100.6, has been on pressure support for few days now, tolerating well so  far.  Respiratory culture grew pansensitive Klebsiella, stenotrophomonas  Interim History / Subjective:  No overnight issues, patient remained afebrile Tolerating spontaneous breathing trial  Objective    Blood pressure 107/66, pulse 76, temperature 98.6 F (37 C), temperature source Axillary, resp. rate (!) 21, height 5' 8 (1.727 m), weight 79.1 kg, SpO2 99%.    Vent Mode: PSV;CPAP FiO2 (%):  [40 %] 40 % Set Rate:  [16 bmp] 16 bmp Vt Set:  [530 mL] 530 mL PEEP:  [5 cmH20] 5 cmH20 Pressure Support:  [8 cmH20] 8 cmH20 Plateau Pressure:  [12 cmH20-14 cmH20] 12 cmH20   Intake/Output Summary (Last 24 hours) at 05/08/2024 0818 Last data filed at 05/08/2024 0700 Gross per 24 hour  Intake 1808.05 ml  Output 2550 ml  Net -741.95 ml   Filed Weights   05/05/24 0500 05/06/24 0500 05/07/24 0500  Weight: 78.6 kg 80.3 kg 79.1 kg    Examination: General: Crtitically ill-appearing male, orally intubated HEENT: [place]/AT, eyes anicteric.  ETT and OGT in place Neuro: Awake, following commands, moving right lower extremity purposefully, plegic right upper extremity and left side, continued to have disconjugate gaze Chest: Coarse breath sounds, no wheezes or rhonchi Heart: Regular rate and rhythm, no murmurs or gallops Abdomen: Soft, nondistended, bowel sounds present  Labs reviewed  Patient Lines/Drains/Airways Status     Active Line/Drains/Airways     Name Placement date Placement time Site Days   Peripheral IV 05/04/24 20 G 1 Anterior;Right Forearm 05/04/24  1029  Forearm  4   Peripheral IV 05/04/24 20 G Left;Posterior Forearm 05/04/24  1929  Forearm  4   NG/OG Vented/Dual Lumen 16 Fr. 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 Stroke team is following, patient's neurological exam is stable, moving right lower extremity, remain plegic on right upper and left side Continue neuro watch Continue secondary stroke prophylaxis Continue aspirin  and statin MRI brain confirmed multifocal ischemic strokes and MRA confirmed right vertebral artery and posterior cerebellar artery occlusion  Chronic HFrEF Echocardiogram showing EF 40 to 45% Patient JVD is still elevated, continue Lasix 40 mg daily Holding GDMT for now in the setting of acute infection  Paroxysmal Atrial fib: Noncompliant with Eliquis  PTA Patient is in sinus rhythm currently Continue IV heparin  infusion for stroke prophylaxis Continue telemetry monitoring  Acute respiratory failure with hypoxia Possible aspiration pneumonia with pansensitive Klebsiella Continue lung protective ventilation VAP prevention bundle in place Tolerating spontaneous breathing trial, patient's family is coming at 9:30 AM, then will attempt extubation, patient's family would not like him to be reintubated or tracheostomy Respiratory culture is growing Klebsiella and stenotrophomonas Switching antibiotic to levofloxacin and Bactrim for dual coverage for stenotrophomonas  Possible seizure upon admission Continue Keppra for now  OSA Currently on mechanical ventilation  Moderate malnutrition Continue tube feeds and dietary supplements  Thrombocytopenia critical illness Closely monitor platelet count and watch for signs of bleeding   Labs   CBC: Recent Labs  Lab 05/02/24 1851 05/02/24 1908 05/04/24 0539 05/05/24 0516 05/06/24 0909 05/07/24 0419 05/08/24 0624  WBC 12.2*   < > 10.8* 7.8 9.1 9.0 8.2  NEUTROABS 10.5*  --   --   --   --   --   --   HGB 16.2   < > 14.5 13.0 13.5 14.0 13.0  HCT 46.0   < > 42.1 37.6* 39.1 40.4 38.8*  MCV 92.2   < > 93.8 94.7 93.3 94.0 96.8  PLT  197   < > 162 129* 142* 145* 142*   < > = values in this interval not displayed.    Basic Metabolic Panel: Recent Labs  Lab 05/02/24 1851 05/02/24 1908 05/02/24 1951 05/02/24 1956 05/02/24 2146 05/03/24 1150 05/04/24 0539 05/04/24 1109 05/05/24 0516 05/06/24 0909 05/07/24 0419 05/08/24 0624  NA 135 137  --  135  --  136 136  --  141  --   --  144  K 4.2 4.1  --  3.8  --  3.8 3.6  --  3.7  --   --  3.6  CL 103 102  --   --   --  106 107  --  110  --   --  110  CO2 20*  --   --   --   --  19* 22  --  23  --   --  26  GLUCOSE 123* 123*  --   --   --  91 92  --  129*  --   --  141*  BUN 17 19  --   --   --  12 10  --  12  --   --  25*  CREATININE 1.03 1.00  --   --  0.87 1.02 0.99  --  0.82  --   --  0.88  CALCIUM   8.6*  --   --   --   --  8.2* 7.9*  --  7.9*  --   --  8.2*  MG  --   --    < >  --   --  1.9  --  1.9 1.9 1.7 2.1  --   PHOS  --   --   --   --   --   --   --  2.5 1.9* 2.2* 2.9  --    < > = values in this interval not displayed.   GFR: Estimated Creatinine Clearance: 75.6 mL/min (by C-G formula based on SCr of 0.88 mg/dL). Recent Labs  Lab 05/02/24 1908 05/02/24 2146 05/05/24 0516 05/06/24 0909 05/07/24 0419 05/08/24 0624  WBC  --    < > 7.8 9.1 9.0 8.2  LATICACIDVEN 1.9  --   --   --   --   --    < > = values in this interval not displayed.    Liver Function Tests: Recent Labs  Lab 05/02/24 1851  AST 23  ALT 11  ALKPHOS 57  BILITOT 1.3*  PROT 6.5  ALBUMIN 3.9   No results for input(s): LIPASE, AMYLASE in the last 168 hours. No results for input(s): AMMONIA in the last 168 hours.  ABG    Component Value Date/Time   PHART 7.350 05/02/2024 1956   PCO2ART 43.3 05/02/2024 1956   PO2ART 531 (H) 05/02/2024 1956   HCO3 23.9 05/02/2024 1956   TCO2 25 05/02/2024 1956   ACIDBASEDEF 2.0 05/02/2024 1956   O2SAT 100 05/02/2024 1956     Coagulation Profile: No results for input(s): INR, PROTIME in the last 168 hours.  Cardiac Enzymes: No  results for input(s): CKTOTAL, CKMB, CKMBINDEX, TROPONINI in the last 168 hours.  HbA1C: Hgb A1c MFr Bld  Date/Time Value Ref Range Status  05/02/2024 09:46 PM 4.5 (L) 4.8 - 5.6 % Final    Comment:    (NOTE) Diagnosis of Diabetes The following HbA1c ranges recommended by the American Diabetes Association (ADA) may be used as an aid in the diagnosis of diabetes mellitus.  Hemoglobin             Suggested A1C NGSP%              Diagnosis  <5.7                   Non Diabetic  5.7-6.4                Pre-Diabetic  >6.4                   Diabetic  <7.0                   Glycemic control for                       adults with diabetes.    05/14/2014 03:08 AM 5.1 <5.7 % Final    Comment:    (NOTE)                                                                       According to the ADA Clinical Practice  Recommendations for 2011, when HbA1c is used as a screening test:  >=6.5%   Diagnostic of Diabetes Mellitus           (if abnormal result is confirmed) 5.7-6.4%   Increased risk of developing Diabetes Mellitus References:Diagnosis and Classification of Diabetes Mellitus,Diabetes Care,2011,34(Suppl 1):S62-S69 and Standards of Medical Care in         Diabetes - 2011,Diabetes Care,2011,34 (Suppl 1):S11-S61.    CBG: Recent Labs  Lab 05/07/24 1536 05/07/24 1927 05/07/24 2309 05/08/24 0337 05/08/24 0810  GLUCAP 174* 132* 159* 148* 142*    The patient is critically ill due to Acute bilateral ischemic stroke/acute respiratory failure with hypoxia.  Critical care was necessary to treat or prevent imminent or life-threatening deterioration.  Critical care was time spent personally by me on the following activities: development of treatment plan with patient and/or surrogate as well as nursing, discussions with consultants, evaluation of patient's response to treatment, examination of patient, obtaining history from patient or surrogate, ordering and performing treatments and  interventions, ordering and review of laboratory studies, ordering and review of radiographic studies, pulse oximetry, re-evaluation of patient's condition and participation in multidisciplinary rounds.   During this encounter critical care time was devoted to patient care services described in this note for 31 minutes.     Valinda Novas, MD [facility] See Amion for pager If no response to pager, please call [phone] until 7pm After 7pm, Please call E-link [phone]

## 2024-05-08 NOTE — Progress Notes (Signed)
 PHARMACY - ANTICOAGULATION CONSULT NOTE  Pharmacy Consult for heparin  Indication: atrial fibrillation  Allergies  Allergen Reactions   Other Itching and Other (See Comments)    Pollen- Itchy eyes, sneezing, runny nose, etc..   Penicillins Other (See Comments)    Doesn't remember exact allergic reaction    Patient Measurements: Height: 5' 8 (172.7 cm) Weight: 79.1 kg (174 lb 6.1 oz) IBW/kg (Calculated) : 68.4  Vital Signs: Temp: 98.6 F (37 C) (10/06 0700) Temp Source: Axillary (10/06 0700) BP: 107/66 (10/06 0700) Pulse Rate: 76 (10/06 0700)  Labs: Recent Labs    05/06/24 0909 05/06/24 1752 05/07/24 0419 05/08/24 0624  HGB 13.5  --  14.0 13.0  HCT 39.1  --  40.4 38.8*  PLT 142*  --  145* 142*  HEPARINUNFRC 0.14* 0.31 0.33 0.35  CREATININE  --   --   --  0.88    Estimated Creatinine Clearance: 75.6 mL/min (by C-G formula based on SCr of 0.88 mg/dL).   Medical History: Past Medical History:  Diagnosis Date   Coronary artery disease    Depression    Hypertension    Myocardial infarction [facility])    Sleep apnea    Stroke [facility])       Assessment: 73 yo M with hx LV mural thrombus 2015 and Afib (CHADS2VASc = 6). No anticoagulation prior to admission. Pharmacy consulted for heparin .   Heparin  level 0.33 is at low end of therapeutic on 1200 units/hr. No overnight issues or concerns per RN.   Goal of Therapy:  Heparin  level 0.3-0.5 units/ml Monitor platelets by anticoagulation protocol: Yes   Plan:  Continue heparin  at 1500 units/hr, no bolus  Monitor daily heparin  level, CBC, signs/symptoms of bleeding   Vermell Mccallum, PharmD

## 2024-05-08 NOTE — Procedures (Signed)
 Extubation Procedure Note  Patient Details:   Name: Nikesh Teschner DOB: 10-Apr-1951 MRN: [id number]   Airway Documentation:    Vent end date: 05/08/24 Vent end time: 1100   Evaluation  O2 sats: stable throughout Complications: No apparent complications Patient did tolerate procedure well. Bilateral Breath Sounds: Diminished, Clear   No  RT extubated patient to 4L [place] per MD order, with RN at bedside. Positive cuff leak noted. No distress or stridor at this time. RT will continue to monitor as needed.   Paulla ONEIDA Gaskins 05/08/2024, 11:07 AM

## 2024-05-08 NOTE — Progress Notes (Signed)
 SLP Cancellation Note  Patient Details Name: Gregory Shields MRN: [id number] DOB: January 26, 1951   Cancelled treatment:       Reason Eval/Treat Not Completed: Patient not medically ready, still on vent as of this am. Will continue to follow as able.    Leita SAILOR., M.A. CCC-SLP Acute Rehabilitation Services Office: [phone]  Secure chat preferred  05/08/2024, 7:55 AM

## 2024-05-08 NOTE — Progress Notes (Addendum)
 STROKE TEAM PROGRESS NOTE    SIGNIFICANT HOSPITAL EVENTS  9/30:    Presented after witnessed seizure by neighbor and EMS.              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   INTERIM HISTORY/SUBJECTIVE Wife and family at bedside.  Family agrees to DNR and patient would not want trach and PEG..  Plan to extubate today and not re-intubate if breathing worsens.   CBC    Component Value Date/Time   WBC 8.2 05/08/2024 0624   RBC 4.01 (L) 05/08/2024 0624   HGB 13.0 05/08/2024 0624   HCT 38.8 (L) 05/08/2024 0624   PLT 142 (L) 05/08/2024 0624   MCV 96.8 05/08/2024 0624   MCH 32.4 05/08/2024 0624   MCHC 33.5 05/08/2024 0624   RDW 12.8 05/08/2024 0624   LYMPHSABS 1.0 05/02/2024 1851   MONOABS 0.5 05/02/2024 1851   EOSABS 0.0 05/02/2024 1851   BASOSABS 0.0 05/02/2024 1851    BMET    Component Value Date/Time   NA 144 05/08/2024 0624   NA 139 06/21/2020 1650   K 3.6 05/08/2024 0624   CL 110 05/08/2024 0624   CO2 26 05/08/2024 0624   GLUCOSE 141 (H) 05/08/2024 0624   BUN 25 (H) 05/08/2024 0624   BUN 13 06/21/2020 1650   CREATININE 0.88 05/08/2024 0624   CALCIUM  8.2 (L) 05/08/2024 0624   GFRNONAA >60 05/08/2024 0624    IMAGING past 24 hours No results found.   Vitals:   05/08/24 0900 05/08/24 1000 05/08/24 1100 05/08/24 1200  BP: 122/89 (!) 171/96 (!) 142/90 (!) 154/96  Pulse: 79 (!) 104 98 (!) 117  Resp: 20 15 17 17   Temp:    99 F (37.2 C)  TempSrc:    Axillary  SpO2: 99% 99% 99% 97%  Weight:      Height:         PHYSICAL EXAM General:  critically ill intubated elderly male  Psych:  Mood and affect appropriate for situation CV: Regular rate and rhythm on monitor Respiratory:  Regular, unlabored respirations on ventilator   GI: Abdomen soft and nontender   NEURO:  Mental Status:  Patient is intubated,on no sedation. Eyes are open  ? Possibly following simple commands intermittently   Cranial Nerves:  II: PERRL. Blinks bilaterally  III, IV, VI: With passive eye raise, conjugate gaze V, VII: ETT in place. Corneal reflex intact, greater on right IX, X: Cough and gag reflexes intact Motor/sensory:  moves right side spontaneously and purposeful, left leg is semi purposeful, Strong withdrawal seen RLE.  Coordination: Unable to assess Gait- deferred   Most Recent NIH 23   ASSESSMENT/PLAN Gregory Shields is a 73 y.o. 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 Now s/p TICI 2B. Admitted for ICU monitoring and stroke workup. NIH on Admission: 28.   Stroke: posterior circulation infarcts with BA occlusion s/p IR with TICI 2B, etiology likely Afib not on AC  CT head No acute abnormality.  Possible remote infarct right corona radiata, extending into basal ganglia.  Small remote infarct right cerebellum. CTA head &  neck with perfusion Positive CTA for acute basilar thrombosis.  Right SCA possibly occluded as it is not visualized.  Occlusion of left PCA at proximal left P2 segment.  Occlusion of r hide ight vertebral artery at origin, remains occluded at vertebral basilar junction. Post IR CT head No evidence of acute infarct or intracranial hemorrhage MRI: Acute right pontine infarcts, left thalamic and bilateral cerebellar infarcts. Right side pontine hemorrhage MRA  Right vertebral artery occlusion, Proximal occlusion left PCA Repeat CT head 10/3 2D Echo EF 40-45% LDL 124 HgbA1c 4.5 UDS + THC  VTE prophylaxis -IV heparin   aspirin  325 mg daily Eliquis  (noncompliant) prior to admission,on IV heparin   Therapy recommendations:  Pending Disposition:  pending    Acute respiratory failure Possible ASp Pneumonia  Decreased ability to protect airway secondary to multiple acute infarcts CCM management, appreciate assistance Mental status precluding extubation at this time  On unasyn    Seizures vs. Posturing  Likely posturing secondary to brainstem stroke, low concern for seizure. Status post Keppra 4.25 g load 9/30 Continue Keppra maintenance dose of 1 g twice daily LTM EEG negative for seizures, will discontinue On propofol, wean as tolerated. OK to transition to Precedex.    Paroxsymal Atrial fibrillation Noted in multiple prior notes is not taking eliquis  due to patient choice. Hx of LV thrombus in 2015 Outpatient cardiology appointment 2021 notes that patient stopped taking his Eliquis  approximately a year ago and is only taking aspirin . 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 Started on Eliquis  at this time as well as aspirin  and Brilinta . Outpatient cardiology appointment 2021 notes that patient stopped taking his Eliquis  approximately a year ago and is only taking aspirin .   Hypertension CHF reduced EF Home meds: None Unstable, requiring pressors Phenylephrine off  Blood pressure goal >160  Hyperlipidemia Home meds: None LDL 124, goal < 70 Add Lipitor  40 mg  Continue statin at discharge   Dysphagia Patient has post-stroke dysphagia, SLP consulted On TF @ 73   Other Stroke Risk Factors Advanced age greater than/equal to 73 CAD/MI with hx of LV thrombus Obstructive sleep apnea, not on CPAP at home Cardiomyopathy with EF 40-45%   Other Active Problems Leukocytosis 12.2--9.4--12.3--10.8, Afebrile    Hospital day # 6   Harlene Pouch, MSN, NP-C [facility] See AMION or use Epic Chat I have personally obtained history,examined this patient, reviewed  notes, independently viewed imaging studies, participated in medical decision making and plan of care.ROS completed by me personally and pertinent positives fully documented  I have made any additions or clarifications directly to the above note. Agree with note above.  Patient neurological exam remains unchanged with patient awake but not interactive and following commands.  Moving all 4 extremities.  Continue to wean off ventilatory support and extubate as tolerated.  Long discussion with wife and family at bedside and they agree with DNR and no reintubation.  Discussed with Dr. Candyce critical care medicine This patient is critically ill and at significant risk of neurological worsening, death and care requires constant monitoring of vital signs, hemodynamics,respiratory and cardiac monitoring, extensive review of multiple databases, frequent neurological assessment, discussion with family, other specialists and medical decision making of high complexity.I have made any additions or clarifications directly to the above note.This critical care time does not reflect procedure time, or teaching time or supervisory time  of PA/NP/Med Resident etc but could involve care discussion time.  I spent 30 minutes of neurocritical care time  in the care of  this patient.      Eather Popp, MD Medical Director [facility] Pager: [phone] 05/08/2024 2:54 PM   To contact Stroke Continuity provider, please refer to [email or web address]. After hours, contact General Neurology

## 2024-05-08 NOTE — Progress Notes (Signed)
 Nutrition Follow-up  DOCUMENTATION CODES:   Non-severe (moderate) malnutrition in context of social or environmental circumstances  INTERVENTION:  Diet per SLP  If pt unable to take PO  recommend restart tube feeding via Cortrak: Osmolite 1.5 at 55 ml/h (1320 ml per day) Recommend beginning at 76ml/h and increasing 10 ml q8h until goal rate reached Prosource TF20 60 ml daily Provides 2060 kcal, 102 gm protein, 1005 ml free water daily Thiamine 100 mg daily for 7 days  NUTRITION DIAGNOSIS:   Moderate Malnutrition related to social / environmental circumstances as evidenced by mild muscle depletion, moderate fat depletion, severe fat depletion. - ongoing   GOAL:   Patient will meet greater than or equal to 90% of their needs - Progressing (met with EN, now paused due to no enteral access)   MONITOR:   Vent status, Labs  REASON FOR ASSESSMENT:   Consult Enteral/tube feeding initiation and management  ASSESSMENT:   Pt with hx of stroke, heart failure, atrial fibrillation, and CAD. 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  9/30 admitted; intubated, thrombectomy 10/1: MRI shows multiple infarcts with right pontine hemorrhage.  10/6 extubated   Patient extubated this morning to 4LNC. OGT removed. Tube feeds at goal prior to extubation. SLP consulted for swallow eval. Pt tired/lethragic post extubation, not answering questions. Pt status changed to DNR today after family discussion, no plans for reintubation or trach/PEG if condition worsens.   Admit weight: 75 kg  Current weight: 79.1 kg    Intake/Output Summary (Last 24 hours) at 05/08/2024 1114 Last data filed at 05/08/2024 0700 Gross per 24 hour  Intake 1541.17 ml  Output 2550 ml  Net -1008.83 ml   Net IO Since Admission: 5,905.53 mL [05/08/24 1114]  Drains/Lines: OG tube: removed  Urethral Catheter: UOP 2550 mL x 24  hr  Nutritionally Relevant Medications: Scheduled Meds: Colace  Lasix  Thiamine 100 mg   Continuous Infusions: None   PRN Meds: senna-docusate, sodium chloride   Labs Reviewed: CBG ranges from 129-174 mg/dL over the last 24 hours Glucose 141   NUTRITION - FOCUSED PHYSICAL EXAM:  Flowsheet Row Most Recent Value  Orbital Region Unable to assess  [vent]  Upper Arm Region Moderate depletion  Thoracic and Lumbar Region Severe depletion  Buccal Region Unable to assess  [vent]  Temple Region Unable to assess  [EEG electrodes]  Clavicle Bone Region Mild depletion  Clavicle and Acromion Bone Region Mild depletion  Scapular Bone Region Unable to assess  Dorsal Hand Mild depletion  Patellar Region Mild depletion  Anterior Thigh Region Mild depletion  Posterior Calf Region Mild depletion  Edema (RD Assessment) None  Hair Reviewed  Eyes Unable to assess  Mouth Unable to assess  Skin Reviewed  Nails Reviewed    Diet Order:   Diet Order             Diet NPO time specified  Diet effective now                   EDUCATION NEEDS:   Not appropriate for education at this time  Skin:  Skin Assessment: Reviewed RN Assessment  Last BM:  10/5 type 6  Height:   Ht Readings from Last 1 Encounters:  05/08/24 5' 8 (1.727 m)    Weight:   Wt Readings from Last 1 Encounters:  05/07/24 79.1 kg    Ideal Body Weight:  70 kg  BMI:  Body mass  index is 26.51 kg/m.  Estimated Nutritional Needs:   Kcal:  1900-2100  Protein:  90-110g  Fluid:  1.9-2.1L  Terry Bolotin, MS, RD, LDN Clinical Dietitian  Please see AMiON for contact information.

## 2024-05-08 NOTE — Plan of Care (Signed)
  Problem: Ischemic Stroke/TIA Tissue Perfusion: Goal: Complications of ischemic stroke/TIA will be minimized Outcome: Progressing   Problem: Coping: Goal: Will verbalize positive feelings about self Outcome: Not Progressing Goal: Will identify appropriate support needs Outcome: Not Progressing   Problem: Self-Care: Goal: Ability to participate in self-care as condition permits will improve Outcome: Not Progressing Goal: Verbalization of feelings and concerns over difficulty with self-care will improve Outcome: Not Progressing

## 2024-05-09 ENCOUNTER — Inpatient Hospital Stay (HOSPITAL_COMMUNITY)

## 2024-05-09 DIAGNOSIS — I480 Paroxysmal atrial fibrillation: Secondary | ICD-10-CM | POA: Diagnosis not present

## 2024-05-09 DIAGNOSIS — I6329 Cerebral infarction due to unspecified occlusion or stenosis of other precerebral arteries: Secondary | ICD-10-CM | POA: Diagnosis not present

## 2024-05-09 DIAGNOSIS — I613 Nontraumatic intracerebral hemorrhage in brain stem: Secondary | ICD-10-CM | POA: Diagnosis not present

## 2024-05-09 DIAGNOSIS — J9601 Acute respiratory failure with hypoxia: Secondary | ICD-10-CM | POA: Diagnosis not present

## 2024-05-09 DIAGNOSIS — I6322 Cerebral infarction due to unspecified occlusion or stenosis of basilar arteries: Secondary | ICD-10-CM | POA: Diagnosis not present

## 2024-05-09 DIAGNOSIS — I5022 Chronic systolic (congestive) heart failure: Secondary | ICD-10-CM | POA: Diagnosis not present

## 2024-05-09 DIAGNOSIS — I63543 Cerebral infarction due to unspecified occlusion or stenosis of bilateral cerebellar arteries: Secondary | ICD-10-CM | POA: Diagnosis not present

## 2024-05-09 LAB — RENAL FUNCTION PANEL
Albumin: 2.8 g/dL — ABNORMAL LOW (ref 3.5–5.0)
Anion gap: 15 (ref 5–15)
BUN: 24 mg/dL — ABNORMAL HIGH (ref 8–23)
CO2: 24 mmol/L (ref 22–32)
Calcium: 8.8 mg/dL — ABNORMAL LOW (ref 8.9–10.3)
Chloride: 102 mmol/L (ref 98–111)
Creatinine, Ser: 0.99 mg/dL (ref 0.61–1.24)
GFR, Estimated: >60 mL/min (ref >60)
Glucose, Bld: 111 mg/dL — ABNORMAL HIGH (ref 70–99)
Phosphorus: 3.9 mg/dL (ref 2.5–4.6)
Potassium: 3.5 mmol/L (ref 3.5–5.1)
Sodium: 141 mmol/L (ref 135–145)

## 2024-05-09 LAB — HEPARIN LEVEL (UNFRACTIONATED): Heparin Unfractionated: 0.29 [IU]/mL — ABNORMAL LOW (ref 0.30–0.70)

## 2024-05-09 LAB — CBC
HCT: 41.8 % (ref 39.0–52.0)
Hemoglobin: 14.5 g/dL (ref 13.0–17.0)
MCH: 32.4 pg (ref 26.0–34.0)
MCHC: 34.7 g/dL (ref 30.0–36.0)
MCV: 93.5 fL (ref 80.0–100.0)
Platelets: 175 K/uL (ref 150–400)
RBC: 4.47 MIL/uL (ref 4.22–5.81)
RDW: 12.5 % (ref 11.5–15.5)
WBC: 10.4 K/uL (ref 4.0–10.5)
nRBC: 0 % (ref 0.0–0.2)

## 2024-05-09 LAB — GLUCOSE, CAPILLARY
Glucose-Capillary: 124 mg/dL — ABNORMAL HIGH (ref 70–99)
Glucose-Capillary: 96 mg/dL (ref 70–99)
Glucose-Capillary: 119 mg/dL — ABNORMAL HIGH (ref 70–99)
Glucose-Capillary: 103 mg/dL — ABNORMAL HIGH (ref 70–99)
Glucose-Capillary: 120 mg/dL — ABNORMAL HIGH (ref 70–99)
Glucose-Capillary: 171 mg/dL — ABNORMAL HIGH (ref 70–99)

## 2024-05-09 MED ORDER — ORAL CARE MOUTH RINSE: 15.0000 mL | OROMUCOSAL | Status: DC | PRN

## 2024-05-09 MED ORDER — SACUBITRIL-VALSARTAN 24-26 MG PO TABS
1.0000 | ORAL_TABLET | Freq: Two times a day (BID) | ORAL | Status: DC
  Administered 2024-05-09 – 2024-05-14 (×10): 1
  Filled 2024-05-09 (×13): qty 1

## 2024-05-09 MED ORDER — ONDANSETRON HCL 4 MG/2ML IJ SOLN
4.0000 mg | Freq: Four times a day (QID) | INTRAMUSCULAR | Status: DC | PRN
  Administered 2024-05-09 – 2024-05-14 (×3): 4 mg via INTRAVENOUS
  Filled 2024-05-09 (×3): qty 2

## 2024-05-09 MED ORDER — FUROSEMIDE 10 MG/ML IJ SOLN
40.0000 mg | Freq: Two times a day (BID) | INTRAMUSCULAR | Status: DC
  Administered 2024-05-09 – 2024-05-11 (×5): 40 mg via INTRAVENOUS
  Filled 2024-05-09 (×5): qty 4

## 2024-05-09 MED ORDER — APIXABAN 5 MG PO TABS
5.0000 mg | ORAL_TABLET | Freq: Two times a day (BID) | ORAL | Status: DC
  Administered 2024-05-09 – 2024-05-14 (×10): 5 mg
  Filled 2024-05-09 (×11): qty 1

## 2024-05-09 MED ORDER — CARVEDILOL 12.5 MG PO TABS
12.5000 mg | ORAL_TABLET | Freq: Two times a day (BID) | ORAL | Status: DC
  Administered 2024-05-09 – 2024-05-11 (×4): 12.5 mg
  Filled 2024-05-09 (×4): qty 1

## 2024-05-09 NOTE — Evaluation (Signed)
 Speech Language Pathology Evaluation Patient Details Name: Gregory Shields MRN: [id number] DOB: 04/10/1951 Today's Date: 05/09/2024 Time: 8889-8844 SLP Time Calculation (min) (ACUTE ONLY): 45 min  Problem List:  Patient Active Problem List   Diagnosis Date Noted   Acute respiratory failure (HCC) 05/03/2024   Malnutrition of moderate degree 05/03/2024   Ischemic stroke (HCC) 05/02/2024   CAD (coronary artery disease) 11/19/2023   HLD (hyperlipidemia) 11/19/2023   Acute appendicitis with microperforation and periappendiceal abscess without gangrene or peritonitis 11/18/2023   Chronic a-fib (HCC) 06/21/2020   Cerebral infarction due to embolism of right middle cerebral artery (HCC) 06/18/2014   History of myocardial infarction less than 8 weeks 06/18/2014   LV (left ventricular) mural thrombus 06/18/2014   Atrial flutter (HCC) 06/18/2014   Cerebral embolism with cerebral infarction (HCC) 05/13/2014   Acute anterolateral wall MI (HCC) 05/12/2014   Past Medical History:  Past Medical History:  Diagnosis Date   Coronary artery disease    Depression    Hypertension    Myocardial infarction (HCC)    Sleep apnea    Stroke [facility])    Past Surgical History:  Past Surgical History:  Procedure Laterality Date   IR PERCUTANEOUS ART THROMBECTOMY/INFUSION INTRACRANIAL INC DIAG ANGIO  05/02/2024   IR US  GUIDE VASC ACCESS RIGHT  05/02/2024   LEFT HEART CATHETERIZATION WITH CORONARY ANGIOGRAM N/A 05/12/2014   Procedure: LEFT HEART CATHETERIZATION WITH CORONARY ANGIOGRAM;  Surgeon: Rober LOISE Chroman, MD;  Location: [facility] CATH LAB;  Service: Cardiovascular;  Laterality: N/A;   RADIOLOGY WITH ANESTHESIA N/A 05/02/2024   Procedure: RADIOLOGY WITH ANESTHESIA;  Surgeon: Dolphus Carrion, MD;  Location: [facility] OR;  Service: Radiology;  Laterality: N/A;   HPI:  73 year old male admitted 9/30, found unresponsive in yard. 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 Extubated on 10/6. PMH significant for coronary artery disease, depression, hypertension, sleep apnea, and CVA.   Assessment / Plan / Recommendation Clinical Impression  Pt demonstrates a multitude of impairments that impact his ability to consistently communicate. Pt does demonstrate auditory comprehension with command following when at rest and not stressed. He can nod yes or shake his head no to wants and needs questions and biographical questions, and move body parts on command when possible. He has disconjugate gaze but can intermittently turn head to look right and track with left eye. Pt is able to minimally articulate, but cannot coordinate breath for phonation and speech. Pt did phonate very clearly when yawning and once, said 'yes' very loudly. There are moments pt does not respond at all, likely when fatigued, possibly experiencing severe dizziness or even nausea. Pt will need ongoing SLP interventions for communication    SLP Assessment  SLP Recommendation/Assessment: Patient needs continued Speech Language Pathology Services SLP Visit Diagnosis: Dysphagia, oropharyngeal phase (R13.12)     Assistance Recommended at Discharge     Functional Status Assessment    Frequency and Duration min 2x/week         SLP Evaluation Cognition  Overall Cognitive Status: Impaired/Different from baseline Arousal/Alertness: Awake/alert Orientation Level: Oriented to person;Oriented to place;Oriented to situation Attention: Focused;Sustained Focused Attention: Appears intact Sustained Attention: Impaired Sustained Attention Impairment: Verbal basic;Functional basic Awareness: Impaired Awareness Impairment: Intellectual impairment       Comprehension  Auditory Comprehension Overall Auditory Comprehension: Impaired Yes/No  Questions: Impaired Basic  Biographical Questions: 51-75% accurate Basic Immediate Environment Questions: 25-49% accurate Commands: Impaired One Step Basic Commands: 25-49% accurate Interfering Components: Attention;Visual impairments;Processing speed EffectiveTechniques: Extra processing time;Repetition    Expression Verbal Expression Overall Verbal Expression: Impaired Initiation: Impaired Automatic Speech: Name Level of Generative/Spontaneous Verbalization: Word Interfering Components: Speech intelligibility   Oral / Motor  Oral Motor/Sensory Function Overall Oral Motor/Sensory Function: Moderate impairment Facial ROM: Suspected CN VII (facial) dysfunction;Reduced left Facial Symmetry: Abnormal symmetry left;Suspected CN VII (facial) dysfunction Facial Strength: Reduced left;Suspected CN VII (facial) dysfunction Facial Sensation: Within Functional Limits Lingual ROM: Reduced left;Suspected CN XII (hypoglossal) dysfunction Lingual Symmetry: Abnormal symmetry left;Suspected CN XII (hypoglossal) dysfunction Lingual Strength: Suspected CN XII (hypoglossal) dysfunction Motor Speech Overall Motor Speech: Impaired Respiration: Impaired Level of Impairment: Word Phonation: Low vocal intensity Articulation: Impaired Level of Impairment: Word Intelligibility: Intelligibility reduced Word: 0-24% accurate            Gregory Shields, Gregory Shields 05/09/2024, 1:54 PM

## 2024-05-09 NOTE — Progress Notes (Signed)
 Physical Therapy Treatment Patient Details Name: Gregory Shields MRN: [id number] DOB: 07/30/1951 Today's Date: 05/09/2024   History of Present Illness Pt is a 72 y.o. male presenting 9/30 after being found having questionable seizure by bystander in his back yard. Intubated on arrival for airway protection. 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 MRI 10/1 with acute right pontine infarcts, left thalamic and bilateral cerebellar infarcts,  Right side pontine hemorrhage. MRA with Right vertebral artery occlusion, Proximal occlusion left PCA. EEG with severe diffuse encephalopathy, no seizure 10/2. UDS with THC.extubated 10/6. 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    PT Comments  Pt appears somewhat drowsy, but effectively answers yes/no questions this date with head nods/vocalization (hums uh-huh for yes). Pt most consistently responds yes/no when not fatigued from session. Pt continuing to require total +2 assist for to/from EOB, once EOB pt tolerating x15 minutes of seated balance with mod evolving max truncal and head support to remain upright. X2 moments of truncal extension when sitting EOB, at least 1 episode brought on by coughing. Pt with volitional movement of RLE and possibly RUE this date. PT to continue to follow.     If plan is discharge home, recommend the following: Two people to help with walking and/or transfers;Two people to help with bathing/dressing/bathroom;Assistance with cooking/housework;Assistance with feeding;Direct supervision/assist for medications management;Direct supervision/assist for financial management;Assist for transportation;Help with stairs or ramp for entrance;Supervision due to cognitive status   Can travel by private vehicle        Equipment Recommendations  [place] lift;Hospital bed;Wheelchair (measurements PT);Wheelchair cushion (measurements PT)     Recommendations for Other Services       Precautions / Restrictions Precautions Precautions: Fall Recall of Precautions/Restrictions: Impaired Precaution/Restrictions Comments: rectal pouch Restrictions Weight Bearing Restrictions Per Provider Order: No     Mobility  Bed Mobility Overal bed mobility: Needs Assistance Bed Mobility: Supine to Sit, Sit to Supine     Supine to sit: Total assist, +2 for physical assistance, +2 for safety/equipment Sit to supine: Total assist, +2 for physical assistance, +2 for safety/equipment   General bed mobility comments: via helicopter method with bed pads    Transfers                   General transfer comment: deferred    Ambulation/Gait                   Stairs             Wheelchair Mobility     Tilt Bed    Modified Rankin (Stroke Patients Only) Modified Rankin (Stroke Patients Only) Pre-Morbid Rankin Score: No symptoms Modified Rankin: Severe disability     Balance Overall balance assessment: Needs assistance Sitting-balance support: No upper extremity supported Sitting balance-Leahy Scale: Zero Sitting balance - Comments: mod-max truncal and head support, pt with x2 periods of truncal extension requiring total A to correct       Standing balance comment: deferred                            Communication Communication Communication: Impaired Factors Affecting Communication: Difficulty expressing self;Reduced clarity of speech  Cognition Arousal: Lethargic Behavior During Therapy: Flat affect   PT - Cognitive impairments: Attention, Initiation, Difficult to assess Difficult to assess due to: Impaired communication  PT - Cognition Comments: Pt responding yes/no initially consistently today with vocalizations (humming uh-huh, for instance) or head nods, with fatigue becomes more inconsistent. follows one-step mobility commands with RLE, and flicker of  elbow flexion/finger flexion RUE on command. focused level of attention, very easily distracted by RN staff, therapeutic inputs for posture during SLP feeding tasks Following commands: Impaired Following commands impaired: Only follows one step commands consistently (with fatigue becomes more inconsistent)    Cueing Cueing Techniques: Verbal cues, Gestural cues, Tactile cues  Exercises      General Comments General comments (skin integrity, edema, etc.): 4LO2, VSS except HR in afib rhythm. possible reflux event EOB s/p ice chip with SLP, pt returned to chair position with head positioned in neutral as pt with preference for cervical extension.      Pertinent Vitals/Pain Pain Assessment Pain Assessment: Faces Faces Pain Scale: No hurt Pain Intervention(s): Monitored during session    Home Living                          Prior Function            PT Goals (current goals can now be found in the care plan section) Acute Rehab PT Goals Patient Stated Goal: unable to state PT Goal Formulation: Patient unable to participate in goal setting Time For Goal Achievement: 05/20/24 Potential to Achieve Goals: Fair Progress towards PT goals: Progressing toward goals    Frequency    Min 3X/week      PT Plan      Co-evaluation PT/OT/SLP Co-Evaluation/Treatment: Yes (with SLP) Reason for Co-Treatment: Complexity of the patient's impairments (multi-system involvement);For patient/therapist safety;To address functional/ADL transfers PT goals addressed during session: Mobility/safety with mobility;Balance        AM-PAC PT 6 Clicks Mobility   Outcome Measure  Help needed turning from your back to your side while in a flat bed without using bedrails?: Total Help needed moving from lying on your back to sitting on the side of a flat bed without using bedrails?: Total Help needed moving to and from a bed to a chair (including a wheelchair)?: Total Help needed standing up  from a chair using your arms (e.g., wheelchair or bedside chair)?: Total Help needed to walk in hospital room?: Total Help needed climbing 3-5 steps with a railing? : Total 6 Click Score: 6    End of Session Equipment Utilized During Treatment: Oxygen Activity Tolerance: Patient limited by lethargy Patient left: in bed;with call bell/phone within reach;with bed alarm set;with nursing/sitter in room;with SCD's reapplied Nurse Communication: Mobility status PT Visit Diagnosis: Muscle weakness (generalized) (M62.81);Difficulty in walking, not elsewhere classified (R26.2);Other symptoms and signs involving the nervous system (R29.898)     Time: 8890-8858 PT Time Calculation (min) (ACUTE ONLY): 32 min  Charges:    $Therapeutic Activity: 8-22 mins $Neuromuscular Re-education: 8-22 mins PT General Charges $$ ACUTE PT VISIT: 1 Visit                     Johana RAMAN, PT DPT Acute Rehabilitation Services Secure Chat Preferred  Office [phone]    Raeya Merritts [place] 05/09/2024, 12:48 PM

## 2024-05-09 NOTE — Evaluation (Signed)
 Clinical/Bedside Swallow Evaluation Patient Details  Name: Gregory Shields MRN: [id number] Date of Birth: [birth year]  Today's Date: 05/09/2024 Time: SLP Start Time (ACUTE ONLY): 0950 SLP Stop Time (ACUTE ONLY): 1020 SLP Time Calculation (min) (ACUTE ONLY): 30 min  Past Medical History:  Past Medical History:  Diagnosis Date   Coronary artery disease    Depression    Hypertension    Myocardial infarction [facility])    Sleep apnea    Stroke [facility])    Past Surgical History:  Past Surgical History:  Procedure Laterality Date   IR PERCUTANEOUS ART THROMBECTOMY/INFUSION INTRACRANIAL INC DIAG ANGIO  05/02/2024   IR US  GUIDE VASC ACCESS RIGHT  05/02/2024   LEFT HEART CATHETERIZATION WITH CORONARY ANGIOGRAM N/A 05/12/2014   Procedure: LEFT HEART CATHETERIZATION WITH CORONARY ANGIOGRAM;  Surgeon: Rober LOISE Chroman, MD;  Location: [facility] CATH LAB;  Service: Cardiovascular;  Laterality: N/A;   RADIOLOGY WITH ANESTHESIA N/A 05/02/2024   Procedure: RADIOLOGY WITH ANESTHESIA;  Surgeon: Dolphus Carrion, MD;  Location: [facility] OR;  Service: Radiology;  Laterality: N/A;   HPI:  73 year old male admitted 9/30, found unresponsive in yard. 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 Extubated on 10/6. PMH significant for coronary artery disease, depression, hypertension, sleep apnea, and CVA.    Assessment / Plan / Recommendation  Clinical Impression  Pt demonstrates a signs of a severe dysphagia impacted by awareness, arousal, ability to initiate movement, prolonged intubation and right sided cranial nerve impairment. Despite staying awake during assessment, he has brief intervals of unresponsiveness. At his best he is able to follow oral motor commands demosntrating right CN VII and XII weakness. He can  initiate a very delayed weak cough and softly phonate. He will not attempt any articulation and when offered a cup edge or spoon he does accept PO easily, it has to be pushed into mouth against resistance, which appears involuntary. Pt has some spontaneous swallows of secretions, but suspect pooled pharyngeal secretions given wet upper airway sounds. There is delayed coughing after any attempts with PO (ice, water, puree) and oral residue. Plan to reassess at edge of bed later for improved awareness and initiation. May conduct FEES in short term, pt will need ongoing alternate method of nutrition as assessment and therapy efforts continue. SLP Visit Diagnosis: Dysphagia, oropharyngeal phase (R13.12)    Aspiration Risk  Severe aspiration risk    Diet Recommendation NPO;Alternative means - temporary         Other  Recommendations Oral Care Recommendations: Oral care QID Caregiver Recommendations: Have oral suction available     Assistance Recommended at Discharge    Functional Status Assessment Patient has had a recent decline in their functional status and demonstrates the ability to make significant improvements in function in a reasonable and predictable amount of time.  Frequency and Duration min 2x/week  2 weeks       Prognosis Prognosis for improved oropharyngeal function: Guarded      Swallow Study   General HPI: 73 year old male admitted 9/30, found unresponsive in yard. 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 Extubated on 10/6. PMH significant for coronary artery disease, depression, hypertension, sleep apnea, and CVA.  Type of Study: Bedside Swallow Evaluation Previous Swallow Assessment: none Diet Prior to this Study: NPO Temperature Spikes Noted:  Yes Respiratory Status: Nasal cannula History of Recent Intubation: Yes Total duration of intubation (days): 6 days Date extubated: 05/08/24 Behavior/Cognition: Requires cueing;Lethargic/Drowsy Oral Cavity Assessment: Within Functional Limits Oral Care Completed by SLP: Yes Oral Cavity - Dentition: Adequate natural dentition Vision: Impaired for self-feeding Self-Feeding Abilities: Total assist Patient Positioning: Upright in bed Baseline Vocal Quality: Low vocal intensity Volitional Cough: Weak Volitional Swallow: Unable to elicit    Oral/Motor/Sensory Function Overall Oral Motor/Sensory Function: Moderate impairment Facial ROM: Suspected CN VII (facial) dysfunction;Reduced left Facial Symmetry: Abnormal symmetry left;Suspected CN VII (facial) dysfunction Facial Strength: Reduced left;Suspected CN VII (facial) dysfunction Lingual ROM: Reduced left;Suspected CN XII (hypoglossal) dysfunction Lingual Symmetry: Abnormal symmetry left;Suspected CN XII (hypoglossal) dysfunction Lingual Strength: Suspected CN XII (hypoglossal) dysfunction   Ice Chips Ice chips: Impaired Presentation: Spoon Oral Phase Impairments: Reduced lingual movement/coordination Oral Phase Functional Implications: Oral residue Pharyngeal Phase Impairments: Cough - Delayed;Suspected delayed Swallow   Thin Liquid Thin Liquid: Impaired Presentation: Cup Oral Phase Impairments: Reduced lingual movement/coordination Oral Phase Functional Implications: Prolonged oral transit    Nectar Thick Nectar Thick Liquid: Not tested   Honey Thick Honey Thick Liquid: Not tested   Puree Puree: Impaired Presentation: Spoon Oral Phase Impairments: Reduced lingual movement/coordination Oral Phase Functional Implications: Prolonged oral transit;Oral residue Pharyngeal Phase Impairments: Cough - Delayed   Solid     Solid: Not tested      Lyndell Allaire, [name] 05/09/2024,12:20 PM

## 2024-05-09 NOTE — Progress Notes (Signed)
 Phagenyx note  This pt has had a Phagenyx Catheter placed for treatment of dysphagia. Speech therapy is responsible for the treatments.  Placement is only completed by a Phagenesis trained and validated RN from [facility].  Bedside RN is responsible for maintaining placement of the catheter as long as possible. Once the tube has been removed it cannot be replaced. The Phagenyx catheter is NOT MRI or defibrillator compatible. If the patient needs either of these or has another medical emergency that requires tube to be removed, the catheter should be removed. This tube will not be used for feeding and should have a red sticker over the feeding port as a reminder to not use it for feeding. Black nasal bands should be visible and the centimeter marking between the 2 black bands should match the depth documented at insertion:  64 cm.  If any questions arise, please secure chat [facility] SLP group and someone will respond between 8am-4pm 7 days a week.   Consuelo Fort, MA CCC-SLP  Acute Rehabilitation Services Secure Chat Preferred Office [phone]    [name]  05/09/2024, 2:46 PM

## 2024-05-09 NOTE — Progress Notes (Signed)
 eLink Physician-Brief Progress Note Patient Name: Gregory Shields DOB: 04/28/1951 MRN: [id number]   Date of Service  05/09/2024  HPI/Events of Note  Nausea/vomiting  eICU Interventions  +Zofran      Intervention Category Minor Interventions: Routine modifications to care plan (e.g. PRN medications for pain, fever)  Abdullah Rizzi 05/09/2024, 3:43 AM

## 2024-05-09 NOTE — Progress Notes (Signed)
 [facility] Tube Team Note:  Consult received to bridle the patient's Phagynx tube. Tube remains in his R nare at 64 cm. RN and SLP in room.   X-ray pending.   Powell SQUIBB., RD, LDN, CNSC See AMiON for contact information

## 2024-05-09 NOTE — Progress Notes (Addendum)
 STROKE TEAM PROGRESS NOTE    SIGNIFICANT HOSPITAL EVENTS   9/30:    Presented after witnessed seizure by neighbor and EMS.              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   INTERIM HISTORY/SUBJECTIVE No family at the bedside.  Patient has been made DNR and one-way extubation.  He is having trouble handling his secretions.  Patient remains on Heparin  gtt. Lots of secretions today. Vitals and labs remain stable  Will keep in the ICU today due to excessive secretions   CBC    Component Value Date/Time   WBC 10.4 05/09/2024 0307   RBC 4.47 05/09/2024 0307   HGB 14.5 05/09/2024 0307   HCT 41.8 05/09/2024 0307   PLT 175 05/09/2024 0307   MCV 93.5 05/09/2024 0307   MCH 32.4 05/09/2024 0307   MCHC 34.7 05/09/2024 0307   RDW 12.5 05/09/2024 0307   LYMPHSABS 1.0 05/02/2024 1851   MONOABS 0.5 05/02/2024 1851   EOSABS 0.0 05/02/2024 1851   BASOSABS 0.0 05/02/2024 1851    BMET    Component Value Date/Time   NA 141 05/09/2024 1111   NA 139 06/21/2020 1650   K 3.5 05/09/2024 1111   CL 102 05/09/2024 1111   CO2 24 05/09/2024 1111   GLUCOSE 111 (H) 05/09/2024 1111   BUN 24 (H) 05/09/2024 1111   BUN 13 06/21/2020 1650   CREATININE 0.99 05/09/2024 1111   CALCIUM  8.8 (L) 05/09/2024 1111   GFRNONAA >60 05/09/2024 1111    IMAGING past 24 hours No results found.  Vitals:   05/09/24 0800 05/09/24 0900 05/09/24 1000 05/09/24 1200  BP: (!) 157/126 (!) 152/100 (!) 153/92   Pulse: (!) 108 (!) 108 95   Resp: (!) 22 (!) 25 20   Temp:    98.9 F (37.2 C)  TempSrc:    Axillary  SpO2: 96% 95% 94%   Weight:      Height:         PHYSICAL EXAM General:  critically ill  elderly male  Psych:  Mood and affect appropriate for situation CV: Regular  rate and rhythm on monitor Respiratory:  Regular, unlabored respirations GI: Abdomen soft and nontender   NEURO:  Mental Status:   Eyes are open, awake, tracking at times, dysconjugate gaze  ? Possibly following simple commands intermittently   Cranial Nerves:  II: PERRL. Blinks bilaterally  III, IV, VI: With passive eye raise, conjugate gaze V, VII: ETT in place. Corneal reflex intact, greater on right IX, X: Cough and gag reflexes intact Motor/sensory:  moves right side spontaneously and purposeful, left leg is semi purposeful, Strong withdrawal seen RLE.  Coordination: Unable to assess Gait- deferred   Most Recent NIH 23     ASSESSMENT/PLAN Gregory Shields is a 73 y.o. 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 Now s/p TICI 2B. Admitted for ICU monitoring and stroke workup. NIH on Admission: 28.   Stroke: posterior circulation infarcts with BA occlusion s/p IR with TICI 2B, etiology likely Afib not on AC  CT head No acute abnormality.  Possible remote infarct right corona radiata, extending into basal ganglia.  Small remote infarct right cerebellum. CTA head & neck with perfusion Positive CTA for acute basilar thrombosis.  Right SCA possibly occluded as it is not visualized.  Occlusion of left PCA at proximal left P2 segment.  Occlusion of r hide ight vertebral artery at origin, remains occluded at vertebral basilar junction. Post IR CT head No evidence of acute infarct or intracranial hemorrhage MRI: Acute right pontine infarcts, left thalamic and bilateral cerebellar infarcts. Right side pontine hemorrhage MRA  Right vertebral artery occlusion, Proximal occlusion left PCA Repeat CT head Multiple recent infarcts of the cerebellum, left thalamus, and right pons,  2D Echo EF 40-45% LDL 124 HgbA1c 4.5 UDS + THC  VTE  prophylaxis -IV heparin   aspirin  325 mg daily Eliquis  (noncompliant) prior to admission,on IV heparin   Therapy recommendations:  Pending Disposition:  pending   Acute respiratory failure Possible ASp Pneumonia  Decreased ability to protect airway secondary to multiple acute infarcts CCM management, appreciate assistance Mental status precluding extubation at this time  On unasyn  Extubated 10/6 with no reintubation  Lots of copious secretions    Seizures vs. Posturing  Likely posturing secondary to brainstem stroke, low concern for seizure. Status post Keppra 4.25 g load 9/30 Continue Keppra maintenance dose of 1 g twice daily LTM EEG negative for seizures, will discontinue On propofol, wean as tolerated. OK to transition to Precedex.    Paroxsymal Atrial fibrillation Noted in multiple prior notes is not taking eliquis  due to patient choice. Hx of LV thrombus in 2015 Outpatient cardiology appointment 2021 notes that patient stopped taking his Eliquis  approximately a year ago and is only taking aspirin . 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 Started on Eliquis  at this time as well as aspirin  and Brilinta . Outpatient cardiology appointment 2021 notes that patient stopped taking his Eliquis  approximately a year ago and is only taking aspirin .   Hypertension CHF reduced EF Home meds: None Phenylephrine off  Blood pressure goal <160   Hyperlipidemia Home meds: None LDL 124, goal < 70 Add Lipitor  40 mg  Continue statin at discharge  Dysphagia Patient has post-stroke dysphagia, SLP consulted    Diet   Diet NPO time specified   Other Stroke Risk Factors Advanced age greater than/equal to 54 CAD/MI with hx of LV thrombus Obstructive sleep apnea, not on CPAP at home Cardiomyopathy with EF 40-45%   Other Active  Problems Leukocytosis 12.2--9.4--12.3--10.8, Afeb  Hospital day # 7   Karna Geralds DNP, ACNPC-AG  [facility] I have personally obtained history,examined this patient, reviewed notes, independently viewed imaging studies, participated in medical decision making and plan of care.ROS completed by me personally and pertinent positives fully documented  I have made any additions or clarifications directly to the above note. Agree with note above.  Patient continues to follow only occasional commands independently and remains quadriplegic.  He is having trouble handling his secretions.  Will keep in ICU today for frequent suctioning.  Continue IV heparin .  No family at the bedside.  Discussed with Dr. Harold critical care medicine. This patient is critically ill and at significant risk of neurological worsening, death and care requires constant monitoring of vital signs, hemodynamics,respiratory and cardiac monitoring, extensive review of multiple databases, frequent neurological assessment, discussion with family, other specialists and medical decision making of  high complexity.I have made any additions or clarifications directly to the above note.This critical care time does not reflect procedure time, or teaching time or supervisory time of PA/NP/Med Resident etc but could involve care discussion time.  I spent 30 minutes of neurocritical care time  in the care of  this patient.      Eather Popp, MD Medical Director [facility] Pager: [phone] 05/09/2024 4:25 PM    To contact Stroke Continuity provider, please refer to [email or web address]. After hours, contact General Neurology

## 2024-05-09 NOTE — TOC Progression Note (Signed)
 Transition of Care [facility]) - Progression Note    Patient Details  Name: Gregory Shields MRN: [id number] Date of Birth: 07/20/1951  Transition of [facility]) CM/SW Contact  Inocente GORMAN Kindle, LCSW Phone Number: 05/09/2024, 10:22 AM  Clinical Narrative:    CSW continuing to follow.                      Expected Discharge Plan and Services                                               Social Drivers of Health (SDOH) Interventions SDOH Screenings   Food Insecurity: No Food Insecurity (11/20/2023)  Housing: Low Risk  (11/20/2023)  Transportation Needs: No Transportation Needs (11/20/2023)  Utilities: Not At Risk (11/20/2023)  Social Connections: Unknown (11/20/2023)  Tobacco Use: Low Risk  (11/18/2023)    Readmission Risk Interventions     No data to display

## 2024-05-09 NOTE — Progress Notes (Signed)
 NAME:  Gregory Shields, MRN:  [id number], DOB:  May 29, 1951, LOS: 7 ADMISSION DATE:  05/02/2024, CONSULTATION DATE: 9/30 REFERRING MD: Dr. Sallyann, CHIEF COMPLAINT: Stroke  History of Present Illness:  72 year old male with past medical history as below, which is significant for coronary artery disease, depression, hypertension, sleep apnea, and CVA.  He was in his usual state of health until 9/30. LKW 1530. Found unresponsive in his yard some time later.  There was some concern for seizure-like activity during EMS evaluation and he was administered Versed .  Upon arrival to the emergency department [phone] he was intubated for airway protection.  CT scan showed no acute hemorrhage.  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  He the patient was outside the window for TNK but was taken to IR where he underwent thrombectomy.  Postoperatively he was transferred to the ICU on the mechanical ventilator.  PCCM was consulted.  Pertinent  Medical History   has a past medical history of Coronary artery disease, Depression, Hypertension, Myocardial infarction [facility]), Sleep apnea, and Stroke (HCC).   Significant Hospital Events: Including procedures, antibiotic start and stop dates in addition to other pertinent events   9/30 presented with basilar artery occlusion and underwent mechanical thrombectomy.  Intubated 10/2 -not able to follow commands despite sedation being completely turned down.  Tolerating pressure support ventilation but airway is not patent.  Start tube feeds 10/3 tolerated spontaneous breathing trial, awaiting family discussion, increased respiratory secretions via G-tube 10/4 patient was tachypneic/tachycardic and uncomfortable overnight on.  On mechanical ventilation, he was switched to pressure support, tolerated well 10/5 patient spiked fever with Tmax 100.6, has been on pressure support for few days now, tolerating well so  far.  Respiratory culture grew pansensitive Klebsiella, stenotrophomonas 10/6 patient was extubated, with intention not to reintubate  Interim History / Subjective:  No overnight issues Patient was successfully extubated yesterday, remained on 2 L nasal cannula oxygen  Objective    Blood pressure (!) 151/115, pulse 96, temperature 99.6 F (37.6 C), temperature source Axillary, resp. rate (!) 22, height 5' 8 (1.727 m), weight 76.5 kg, SpO2 97%.        Intake/Output Summary (Last 24 hours) at 05/09/2024 0804 Last data filed at 05/09/2024 0700 Gross per 24 hour  Intake 2729.69 ml  Output 2600 ml  Net 129.69 ml   Filed Weights   05/06/24 0500 05/07/24 0500 05/09/24 0500  Weight: 80.3 kg 79.1 kg 76.5 kg    Examination: General: Elderly male, lying on the bed HEENT: [place]/AT, eyes anicteric.  moist mucus membranes.  JVD is elevated Neuro: Awake, tracking examiner, disconjugate gaze, moving the right lower extremity purposefully, plegic on right upper and left side Chest: Coarse breath sounds, no wheezes or rhonchi Heart: Tachycardic, regular rhythm, no murmurs or gallops Abdomen: Soft, nontender, nondistended, bowel sounds present   Patient Lines/Drains/Airways Status     Active Line/Drains/Airways     Name Placement date Placement time Site Days   Peripheral IV 05/04/24 20 G 1 Anterior;Right Forearm 05/04/24  1029  Forearm  5   Peripheral IV 05/04/24 20 G Left;Posterior Forearm 05/04/24  1929  Forearm  5   External Urinary Catheter 05/04/24  1051  --  5        Resolved problem list  Hypophosphatemia  Assessment and Plan  Acute multifocal ischemic stroke involving bilateral cerebellum, right pons and left thalamus secondary to basilar artery occlusion status post mechanical  thrombectomy Stroke team is following, patient's neurological exam is stable, moving right lower extremity, remain plegic on right upper and left side Continue secondary stroke prophylaxis Continue  aspirin  and statin Continue PT/OT evaluation  Chronic HFrEF Hypertension Echocardiogram showing EF 40 to 45% Patient JVD is still elevated, increase Lasix 40 mg twice daily Started on Coreg  and Entresto  Paroxysmal Atrial fib: Noncompliant with Eliquis  PTA Patient is in sinus rhythm currently Switch IV heparin  infusion to Eliquis  Continue telemetry monitoring  Acute respiratory failure with hypoxia Possible aspiration pneumonia with pansensitive Klebsiella and stenotrophomonas Successfully extubated yesterday Currently on 2 L nasal cannula oxygen Continue levofloxacin and Bactrim for Klebsiella and stenotrophomonas pneumonia to complete 7 days therapy  Possible seizure upon admission Continue Keppra for now  OSA Outpatient follow-up with pulmonary  Moderate malnutrition Continue tube feeds and dietary supplements  Thrombocytopenia critical illness Closely monitor platelet count and watch for signs of bleeding   Labs   CBC: Recent Labs  Lab 05/02/24 1851 05/02/24 1908 05/05/24 0516 05/06/24 0909 05/07/24 0419 05/08/24 0624 05/09/24 0307  WBC 12.2*   < > 7.8 9.1 9.0 8.2 10.4  NEUTROABS 10.5*  --   --   --   --   --   --   HGB 16.2   < > 13.0 13.5 14.0 13.0 14.5  HCT 46.0   < > 37.6* 39.1 40.4 38.8* 41.8  MCV 92.2   < > 94.7 93.3 94.0 96.8 93.5  PLT 197   < > 129* 142* 145* 142* 175   < > = values in this interval not displayed.    Basic Metabolic Panel: Recent Labs  Lab 05/02/24 1851 05/02/24 1908 05/02/24 1951 05/02/24 1956 05/02/24 2146 05/03/24 1150 05/04/24 0539 05/04/24 1109 05/05/24 0516 05/06/24 0909 05/07/24 0419 05/08/24 0624  NA 135 137  --  135  --  136 136  --  141  --   --  144  K 4.2 4.1  --  3.8  --  3.8 3.6  --  3.7  --   --  3.6  CL 103 102  --   --   --  106 107  --  110  --   --  110  CO2 20*  --   --   --   --  19* 22  --  23  --   --  26  GLUCOSE 123* 123*  --   --   --  91 92  --  129*  --   --  141*  BUN 17 19  --   --   --   12 10  --  12  --   --  25*  CREATININE 1.03 1.00  --   --  0.87 1.02 0.99  --  0.82  --   --  0.88  CALCIUM  8.6*  --   --   --   --  8.2* 7.9*  --  7.9*  --   --  8.2*  MG  --   --    < >  --   --  1.9  --  1.9 1.9 1.7 2.1  --   PHOS  --   --   --   --   --   --   --  2.5 1.9* 2.2* 2.9  --    < > = values in this interval not displayed.   GFR: Estimated Creatinine Clearance: 75.6 mL/min (by C-G formula based on SCr  of 0.88 mg/dL). Recent Labs  Lab 05/02/24 1908 05/02/24 2146 05/06/24 0909 05/07/24 0419 05/08/24 0624 05/09/24 0307  WBC  --    < > 9.1 9.0 8.2 10.4  LATICACIDVEN 1.9  --   --   --   --   --    < > = values in this interval not displayed.    Liver Function Tests: Recent Labs  Lab 05/02/24 1851  AST 23  ALT 11  ALKPHOS 57  BILITOT 1.3*  PROT 6.5  ALBUMIN 3.9   No results for input(s): LIPASE, AMYLASE in the last 168 hours. No results for input(s): AMMONIA in the last 168 hours.  ABG    Component Value Date/Time   PHART 7.350 05/02/2024 1956   PCO2ART 43.3 05/02/2024 1956   PO2ART 531 (H) 05/02/2024 1956   HCO3 23.9 05/02/2024 1956   TCO2 25 05/02/2024 1956   ACIDBASEDEF 2.0 05/02/2024 1956   O2SAT 100 05/02/2024 1956     Coagulation Profile: No results for input(s): INR, PROTIME in the last 168 hours.  Cardiac Enzymes: No results for input(s): CKTOTAL, CKMB, CKMBINDEX, TROPONINI in the last 168 hours.  HbA1C: Hgb A1c MFr Bld  Date/Time Value Ref Range Status  05/02/2024 09:46 PM 4.5 (L) 4.8 - 5.6 % Final    Comment:    (NOTE) Diagnosis of Diabetes The following HbA1c ranges recommended by the American Diabetes Association (ADA) may be used as an aid in the diagnosis of diabetes mellitus.  Hemoglobin             Suggested A1C NGSP%              Diagnosis  <5.7                   Non Diabetic  5.7-6.4                Pre-Diabetic  >6.4                   Diabetic  <7.0                   Glycemic control for                        adults with diabetes.    05/14/2014 03:08 AM 5.1 <5.7 % Final    Comment:    (NOTE)                                                                       According to the ADA Clinical Practice Recommendations for 2011, when HbA1c is used as a screening test:  >=6.5%   Diagnostic of Diabetes Mellitus           (if abnormal result is confirmed) 5.7-6.4%   Increased risk of developing Diabetes Mellitus References:Diagnosis and Classification of Diabetes Mellitus,Diabetes Care,2011,34(Suppl 1):S62-S69 and Standards of Medical Care in         Diabetes - 2011,Diabetes Care,2011,34 (Suppl 1):S11-S61.    CBG: Recent Labs  Lab 05/08/24 1128 05/08/24 1522 05/08/24 1912 05/08/24 2308 05/09/24 0324  GLUCAP 129* 131* 110* 139* 124*      Valinda Novas, MD [facility] See Amion  for pager If no response to pager, please call [phone] until 7pm After 7pm, Please call E-link [phone]

## 2024-05-09 NOTE — Progress Notes (Signed)
 PHARMACY - ANTICOAGULATION CONSULT NOTE  Pharmacy Consult for heparin  Indication: atrial fibrillation  Allergies  Allergen Reactions   Other Itching and Other (See Comments)    Pollen- Itchy eyes, sneezing, runny nose, etc..   Penicillins Other (See Comments)    Doesn't remember exact allergic reaction    Patient Measurements: Height: 5' 8 (172.7 cm) Weight: 76.5 kg (168 lb 10.4 oz) IBW/kg (Calculated) : 68.4  Vital Signs: Temp: 99.6 F (37.6 C) (10/07 0700) Temp Source: Axillary (10/07 0700) BP: 152/100 (10/07 0900) Pulse Rate: 108 (10/07 0900)  Labs: Recent Labs    05/07/24 0419 05/08/24 0624 05/09/24 0307  HGB 14.0 13.0 14.5  HCT 40.4 38.8* 41.8  PLT 145* 142* 175  HEPARINUNFRC 0.33 0.35 0.29*  CREATININE  --  0.88  --     Estimated Creatinine Clearance: 75.6 mL/min (by C-G formula based on SCr of 0.88 mg/dL).   Medical History: Past Medical History:  Diagnosis Date   Coronary artery disease    Depression    Hypertension    Myocardial infarction [facility])    Sleep apnea    Stroke [facility])       Assessment: 73 yo M with hx LV mural thrombus 2015 and Afib (CHADS2VASc = 6). No anticoagulation prior to admission. Pharmacy consulted for anticoagulation with Eliquis . Heparin  infusing currently at 1500 units/hr.   Plan:  Discontinue heparin  and start Eliquis  5mg  BID per tube within 2 hours of heparin  stopping.   Joao Mccurdy, PharmD

## 2024-05-10 ENCOUNTER — Inpatient Hospital Stay (HOSPITAL_COMMUNITY)

## 2024-05-10 DIAGNOSIS — I613 Nontraumatic intracerebral hemorrhage in brain stem: Secondary | ICD-10-CM | POA: Diagnosis not present

## 2024-05-10 DIAGNOSIS — I6329 Cerebral infarction due to unspecified occlusion or stenosis of other precerebral arteries: Secondary | ICD-10-CM | POA: Diagnosis not present

## 2024-05-10 DIAGNOSIS — I63543 Cerebral infarction due to unspecified occlusion or stenosis of bilateral cerebellar arteries: Secondary | ICD-10-CM | POA: Diagnosis not present

## 2024-05-10 DIAGNOSIS — I6322 Cerebral infarction due to unspecified occlusion or stenosis of basilar arteries: Secondary | ICD-10-CM | POA: Diagnosis not present

## 2024-05-10 LAB — CBC
HCT: 44 % (ref 39.0–52.0)
Hemoglobin: 15.6 g/dL (ref 13.0–17.0)
MCH: 31.8 pg (ref 26.0–34.0)
MCHC: 35.5 g/dL (ref 30.0–36.0)
MCV: 89.8 fL (ref 80.0–100.0)
Platelets: 218 K/uL (ref 150–400)
RBC: 4.9 MIL/uL (ref 4.22–5.81)
RDW: 12.4 % (ref 11.5–15.5)
WBC: 10.2 K/uL (ref 4.0–10.5)
nRBC: 0 % (ref 0.0–0.2)

## 2024-05-10 LAB — BASIC METABOLIC PANEL WITH GFR
Anion gap: 14 (ref 5–15)
BUN: 27 mg/dL — ABNORMAL HIGH (ref 8–23)
CO2: 22 mmol/L (ref 22–32)
Calcium: 8.7 mg/dL — ABNORMAL LOW (ref 8.9–10.3)
Chloride: 98 mmol/L (ref 98–111)
Creatinine, Ser: 1.1 mg/dL (ref 0.61–1.24)
GFR, Estimated: >60 mL/min (ref >60)
Glucose, Bld: 143 mg/dL — ABNORMAL HIGH (ref 70–99)
Potassium: 3.7 mmol/L (ref 3.5–5.1)
Sodium: 134 mmol/L — ABNORMAL LOW (ref 135–145)

## 2024-05-10 LAB — GLUCOSE, CAPILLARY
Glucose-Capillary: 116 mg/dL — ABNORMAL HIGH (ref 70–99)
Glucose-Capillary: 113 mg/dL — ABNORMAL HIGH (ref 70–99)
Glucose-Capillary: 132 mg/dL — ABNORMAL HIGH (ref 70–99)
Glucose-Capillary: 116 mg/dL — ABNORMAL HIGH (ref 70–99)
Glucose-Capillary: 166 mg/dL — ABNORMAL HIGH (ref 70–99)
Glucose-Capillary: 151 mg/dL — ABNORMAL HIGH (ref 70–99)

## 2024-05-10 MED ORDER — POTASSIUM CHLORIDE 20 MEQ PO PACK
40.0000 meq | PACK | Freq: Once | ORAL | Status: AC
  Administered 2024-05-10: 40 meq
  Filled 2024-05-10: qty 2

## 2024-05-10 NOTE — Procedures (Signed)
 Cortrak  Person Inserting Tube:  Alvar Malinoski T, RD Tube Type:  Cortrak - 43 inches Tube Size:  10 Tube Location:  Right nare Secured by: Bridle Initial Placement:  Gastric Technique Used to Measure Tube Placement:  Marking at nare/corner of mouth Cortrak Secured At:  65 cm Initial Placement Verification:  Xray   Cortrak Tube Team Note:  Consult received to place a Cortrak feeding tube.   X-ray has been ordered by the Cortrak team. Please confirm placement prior to using tube.   If the tube becomes dislodged please keep the tube and contact the Cortrak team at [email or web address] for replacement.  If after hours and replacement cannot be delayed, place a NG tube and confirm placement with an abdominal x-ray.    Trude Ned RD, LDN Contact via [employer].

## 2024-05-10 NOTE — Progress Notes (Signed)
 Speech Language Pathology Treatment: Dysphagia  Patient Details Name: Masaichi Kracht MRN: [id number] DOB: 27-Dec-1950 Today's Date: 05/10/2024 Time: 9059-8897 SLP Time Calculation (min) (ACUTE ONLY): 82 min  Assessment / Plan / Recommendation Clinical Impression  Patient seen for dysphagia treatment including Phagenyx therapy with a goal of improving secretion management in setting of a severe dysphagia. Pt lethargic today, looks exhausted. Still following commands, but falls asleep intermittently, but wakes up coughing on secretions. Upright position seems to help him reduce coughing. Pt has increased heart rate when coughing, up to 140. He also has a strong full body extensor response with oral care and during phagenyx stimulation above 43 mA.   SLP prepped pt for therapy with oral care, ice chip trials and cues for coughing to thin and mobilize secretions after a long night of mouth breathing. Prior to initiation of Phagenyx therapy, SLP verified guidewire removal, and the patient's NEX (nasal to earlobe xiphisternum) number of 64 (verified through visualization between the nasal bands). Patient successfully completed 1 of 3 Pharyngeal Electrical Stimulation (PES) treatment sessions (Cycle # 1).  Patient tolerated Phagenyx treatment for full treatment session (10 minutes) at 44 mA (stimulation target >20mA) without need for rest breaks.   Clinical Observations:  Improved vocal quality with reduced hydrophonia suggestive of increased secretion management. Pt had instances of deep breaths with phonation.  Increased swallow frequency. Improved alertness. Family arrived after treatment and participated in education about treatment plan with SLP and dietitian.    Assessment:  SLP performed pre-treatment testing. The threshold level was determined to be 29mA based on the average of 3 threshold tests. The tolerance level was determined to be 49 mA based on the average of 3 tolerance tests. The  stimulation level was determined to be 44 mA (tolerance level - threshold level) x.75) + threshold.        HPI HPI: 73 year old male admitted 9/30, found unresponsive in yard. 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 Extubated on 10/6. PMH significant for coronary artery disease, depression, hypertension, sleep apnea, and CVA.      SLP Plan  Continue with current plan of care          Recommendations  Diet recommendations: NPO                  Oral care QID     Dysphagia, oropharyngeal phase (R13.12)     Continue with current plan of care     Shant Hence, [name]  05/10/2024, 12:02 PM

## 2024-05-10 NOTE — Progress Notes (Signed)
 STROKE TEAM PROGRESS NOTE    SIGNIFICANT HOSPITAL EVENTS   9/30:    Presented after witnessed seizure by neighbor and EMS.              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   INTERIM HISTORY/SUBJECTIVE No family at the bedside.  Patient neurological exam remains unchanged.  He follows occasional simple commands.  He still has trouble handling his secretions and swallowing..  Patient remains on Heparin  gtt. Lots of secretions today. Vitals and labs remain stable  Will plan for out of ICU today.  He is getting Phagenyx catheter stimulation to improve dysphagia CBC    Component Value Date/Time   WBC 10.2 05/10/2024 0228   RBC 4.90 05/10/2024 0228   HGB 15.6 05/10/2024 0228   HCT 44.0 05/10/2024 0228   PLT 218 05/10/2024 0228   MCV 89.8 05/10/2024 0228   MCH 31.8 05/10/2024 0228   MCHC 35.5 05/10/2024 0228   RDW 12.4 05/10/2024 0228   LYMPHSABS 1.0 05/02/2024 1851   MONOABS 0.5 05/02/2024 1851   EOSABS 0.0 05/02/2024 1851   BASOSABS 0.0 05/02/2024 1851    BMET    Component Value Date/Time   NA 134 (L) 05/10/2024 0713   NA 139 06/21/2020 1650   K 3.7 05/10/2024 0713   CL 98 05/10/2024 0713   CO2 22 05/10/2024 0713   GLUCOSE 143 (H) 05/10/2024 0713   BUN 27 (H) 05/10/2024 0713   BUN 13 06/21/2020 1650   CREATININE 1.10 05/10/2024 0713   CALCIUM  8.7 (L) 05/10/2024 0713   GFRNONAA >60 05/10/2024 0713    IMAGING past 24 hours DG Abd Portable 1V Result Date: 05/10/2024 CLINICAL DATA:  Feeding tube placement. EXAM: PORTABLE ABDOMEN - 1 VIEW COMPARISON:  05/09/2024. FINDINGS: Feeding tube terminates in the gastric antrum. A second small bore tube terminates in the distal stomach. Bowel gas pattern is unremarkable. IMPRESSION: Two enteric  tubes terminating in the gastric antrum and distal stomach, respectively. Are Electronically Signed   By: Newell Eke M.D.   On: 05/10/2024 13:06    Vitals:   05/10/24 1000 05/10/24 1100 05/10/24 1200 05/10/24 1300  BP: (!) 148/125 96/79 108/73 99/72  Pulse: 98 (!) 101 90 89  Resp: 19 (!) 22 18 (!) 25  Temp:   99.2 F (37.3 C)   TempSrc:   Axillary   SpO2: 97% 95% 95% 94%  Weight:      Height:         PHYSICAL EXAM General:  critically ill  elderly male  Psych:  Mood and affect appropriate for situation CV: Regular rate and rhythm on monitor Respiratory:  Regular, unlabored respirations GI: Abdomen soft and nontender   NEURO:  Mental Status:   Eyes are open, awake, tracking at times, dysconjugate gaze  ? Possibly following simple commands intermittently   Cranial Nerves:  II: PERRL. Blinks bilaterally  III, IV, VI: With passive eye raise, conjugate gaze V, VII: ETT in place. Corneal reflex intact, greater on right IX, X: Cough and gag reflexes intact Motor/sensory:  moves right side spontaneously and purposeful, left leg is semi purposeful, Strong withdrawal seen RLE.  Coordination: Unable to assess Gait- deferred   Most Recent NIH 23  ASSESSMENT/PLAN [name] is a 73 y.o. 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 Now s/p TICI 2B. Admitted for ICU monitoring and stroke workup. NIH on Admission: 28.   Stroke: posterior circulation infarcts with BA occlusion s/p IR with TICI 2B, etiology likely Afib not on AC  CT head No acute abnormality.  Possible remote infarct right corona radiata, extending into basal ganglia.  Small remote infarct right cerebellum. CTA head & neck with perfusion Positive CTA for acute basilar thrombosis.  Right SCA possibly occluded as it is not visualized.  Occlusion of left PCA  at proximal left P2 segment.  Occlusion of r hide ight vertebral artery at origin, remains occluded at vertebral basilar junction. Post IR CT head No evidence of acute infarct or intracranial hemorrhage MRI: Acute right pontine infarcts, left thalamic and bilateral cerebellar infarcts. Right side pontine hemorrhage MRA  Right vertebral artery occlusion, Proximal occlusion left PCA Repeat CT head Multiple recent infarcts of the cerebellum, left thalamus, and right pons,  2D Echo EF 40-45% LDL 124 HgbA1c 4.5 UDS + THC  VTE prophylaxis -IV heparin   aspirin  325 mg daily Eliquis  (noncompliant) prior to admission,on IV heparin   Therapy recommendations:  Pending Disposition:  pending   Acute respiratory failure Possible ASp Pneumonia  Decreased ability to protect airway secondary to multiple acute infarcts CCM management, appreciate assistance Mental status precluding extubation at this time  On unasyn  Extubated 10/6 with no reintubation  Lots of copious secretions    Seizures vs. Posturing  Likely posturing secondary to brainstem stroke, low concern for seizure. Status post Keppra 4.25 g load 9/30 Continue Keppra maintenance dose of 1 g twice daily LTM EEG negative for seizures, will discontinue On propofol, wean as tolerated. OK to transition to Precedex.    Paroxsymal Atrial fibrillation Noted in multiple prior notes is not taking eliquis  due to patient choice. Hx of LV thrombus in 2015 Outpatient cardiology appointment 2021 notes that patient stopped taking his Eliquis  approximately a year ago and is only taking aspirin . 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 Started on Eliquis  at this time as well as aspirin  and Brilinta . Outpatient cardiology appointment 2021 notes that patient stopped taking his Eliquis  approximately a year  ago and is only taking aspirin .   Hypertension CHF reduced EF Home meds: None Phenylephrine off  Blood pressure goal <160   Hyperlipidemia Home meds: None LDL 124, goal < 70 Add Lipitor  40 mg  Continue statin at discharge  Dysphagia Patient has post-stroke dysphagia, SLP consulted    Diet   Diet NPO time specified   Other Stroke Risk Factors Advanced age greater than/equal to 94 CAD/MI with hx of LV thrombus Obstructive sleep apnea, not on CPAP at home Cardiomyopathy with EF 40-45%   Other Active Problems Leukocytosis 12.2--9.4--12.3--10.8, Afeb  Hospital day # 8     Patient continues to follow only occasional commands independently and remains quadriplegic.  He is having trouble handling his secretions.  Will keep in ICU today for frequent suctioning. Phagenx catheter stimulation to improve dysphagia.  Discussed with speech pathologist.  Transfer out of ICU to neurology floor bed today valuable   I personally spent a total of 50 minutes in the  care of the patient today including getting/reviewing separately obtained history, performing a medically appropriate exam/evaluation, counseling and educating, placing orders, referring and communicating with other health care professionals, documenting clinical information in the EHR, independently interpreting results, and coordinating care.          Eather Popp, MD Medical Director [facility] Stroke Center Pager: [phone] 05/10/2024 4:29 PM    To contact Stroke Continuity provider, please refer to [email or web address]. After hours, contact General Neurology

## 2024-05-10 NOTE — Progress Notes (Signed)
 Nutrition Follow-up  DOCUMENTATION CODES:   Non-severe (moderate) malnutrition in context of social or environmental circumstances  INTERVENTION:  Restart tube feeding via Cortrak: Osmolite 1.5 at 55 ml/h (1320 ml per day) Recommend beginning at 60ml/h and increasing 10 ml q8h until goal rate reached Prosource TF20 60 ml daily Provides 2060 kcal, 102 gm protein, 1005 ml free water daily   NUTRITION DIAGNOSIS:   Moderate Malnutrition related to social / environmental circumstances as evidenced by mild muscle depletion, moderate fat depletion, severe fat depletion. - ongoing   GOAL:   Patient will meet greater than or equal to 90% of their needs - not met   MONITOR:   PO intake, TF tolerance, Labs  REASON FOR ASSESSMENT:   Consult Enteral/tube feeding initiation and management  ASSESSMENT:   Pt with hx of stroke, heart failure, atrial fibrillation, and CAD. 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  9/30 admitted; intubated, thrombectomy 10/1: MRI shows multiple infarcts with right pontine hemorrhage.  10/6 extubated  10/7 Phagenyx Catheter placed and bridled  Remains NPO per SLP for dysphagia, phagenyx catheter placed for treatment, no enteral access present. Cortak tube ordered. Plan for patient to downgrade from ICU today. Spouse and granddaughter came in during visit. Spouse reports no known recent changes to patient appetite or weight PTA. She reports patient had a healthy appetite adnwas very physically active, was up walking dog on day of admission. Spoke to spouse at legnth regarding current course of treatement with SLP. Disscused plan to place cortrak to resume tube feedings while patient is NPO with Phagenyx. Once 6 day trial is compplete, SLP will reevaluate swallow ability. Spouse voiced concern that patient has mentioned in the past that he would not want a permenant feedign tube.  Explained that PEG tube is considered a form of long term access for nutrition support and that does not necessarily mean patient will have it forever. Patient could start to potenitally progress to be able to able to swallow but may need supplemental nutrition for a period of time in addition to oral intake. SLP and RD also explained role of palliative care team and how they would be able to talk with spouse about current course of treatment and goals of care for patient.   Admit weight: 75 kg  Current weight: 74.2 kg    Intake/Output Summary (Last 24 hours) at 05/10/2024 0953 Last data filed at 05/10/2024 0600 Gross per 24 hour  Intake 1492.5 ml  Output 2950 ml  Net -1457.5 ml   Net IO Since Admission: 4,577.72 mL [05/10/24 0953]  Drains/Lines: Phagenyx Catheter  Urethral Catheter: UOP 2950 mL x 24 hr  Nutritionally Relevant Medications: Scheduled Meds: Miralax Lasix  Thiamine 100 mg ( 7 day course completed today)   Continuous Infusions: None   PRN Meds: zofran    Labs Reviewed: Sodium 134 Glucose 143  BUN 27 Calcium  8.7  CBG ranges from 96-171 mg/dL over the last 24 hours   NUTRITION - FOCUSED PHYSICAL EXAM:  Flowsheet Row Most Recent Value  Orbital Region Unable to assess  [vent]  Upper Arm Region Moderate depletion  Thoracic and Lumbar Region Severe depletion  Buccal Region Unable to assess  [vent]  Temple Region Unable to assess  [EEG electrodes]  Clavicle Bone Region Mild depletion  Clavicle and Acromion Bone Region Mild depletion  Scapular Bone Region Unable to assess  Dorsal Hand Mild depletion  Patellar Region Mild depletion  Anterior  Thigh Region Mild depletion  Posterior Calf Region Mild depletion  Edema (RD Assessment) None  Hair Reviewed  Eyes Unable to assess  Mouth Unable to assess  Skin Reviewed  Nails Reviewed    Diet Order:   Diet Order             Diet NPO time specified  Diet effective now                   EDUCATION NEEDS:    Not appropriate for education at this time  Skin:  Skin Assessment: Reviewed RN Assessment  Last BM:  10/7  Height:   Ht Readings from Last 1 Encounters:  05/08/24 5' 8 (1.727 m)    Weight:   Wt Readings from Last 1 Encounters:  05/10/24 74.2 kg    Ideal Body Weight:  70 kg  BMI:  Body mass index is 24.87 kg/m.  Estimated Nutritional Needs:   Kcal:  1900-2100  Protein:  90-110g  Fluid:  1.9-2.1L  Cerita Rabelo, MS, RD, LDN Clinical Dietitian  Please see AMiON for contact information.

## 2024-05-11 DIAGNOSIS — I6329 Cerebral infarction due to unspecified occlusion or stenosis of other precerebral arteries: Secondary | ICD-10-CM | POA: Diagnosis not present

## 2024-05-11 DIAGNOSIS — I63543 Cerebral infarction due to unspecified occlusion or stenosis of bilateral cerebellar arteries: Secondary | ICD-10-CM | POA: Diagnosis not present

## 2024-05-11 DIAGNOSIS — I613 Nontraumatic intracerebral hemorrhage in brain stem: Secondary | ICD-10-CM | POA: Diagnosis not present

## 2024-05-11 DIAGNOSIS — I6322 Cerebral infarction due to unspecified occlusion or stenosis of basilar arteries: Secondary | ICD-10-CM | POA: Diagnosis not present

## 2024-05-11 LAB — BASIC METABOLIC PANEL WITH GFR
Anion gap: 14 (ref 5–15)
BUN: 40 mg/dL — ABNORMAL HIGH (ref 8–23)
CO2: 22 mmol/L (ref 22–32)
Calcium: 8.8 mg/dL — ABNORMAL LOW (ref 8.9–10.3)
Chloride: 102 mmol/L (ref 98–111)
Creatinine, Ser: 1.34 mg/dL — ABNORMAL HIGH (ref 0.61–1.24)
GFR, Estimated: 57 mL/min — ABNORMAL LOW (ref >60)
Glucose, Bld: 151 mg/dL — ABNORMAL HIGH (ref 70–99)
Potassium: 4 mmol/L (ref 3.5–5.1)
Sodium: 138 mmol/L (ref 135–145)

## 2024-05-11 LAB — GLUCOSE, CAPILLARY
Glucose-Capillary: 145 mg/dL — ABNORMAL HIGH (ref 70–99)
Glucose-Capillary: 171 mg/dL — ABNORMAL HIGH (ref 70–99)
Glucose-Capillary: 154 mg/dL — ABNORMAL HIGH (ref 70–99)
Glucose-Capillary: 137 mg/dL — ABNORMAL HIGH (ref 70–99)
Glucose-Capillary: 138 mg/dL — ABNORMAL HIGH (ref 70–99)

## 2024-05-11 LAB — CBC
HCT: 47.4 % (ref 39.0–52.0)
Hemoglobin: 16.7 g/dL (ref 13.0–17.0)
MCH: 31.8 pg (ref 26.0–34.0)
MCHC: 35.2 g/dL (ref 30.0–36.0)
MCV: 90.3 fL (ref 80.0–100.0)
Platelets: 290 K/uL (ref 150–400)
RBC: 5.25 MIL/uL (ref 4.22–5.81)
RDW: 12.3 % (ref 11.5–15.5)
WBC: 11.9 K/uL — ABNORMAL HIGH (ref 4.0–10.5)
nRBC: 0 % (ref 0.0–0.2)

## 2024-05-11 MED ORDER — FREE WATER
200.0000 mL | Freq: Four times a day (QID) | Status: AC
  Administered 2024-05-11 – 2024-05-12 (×3): 200 mL

## 2024-05-11 MED ORDER — LEVOFLOXACIN 750 MG PO TABS
750.0000 mg | ORAL_TABLET | Freq: Every day | ORAL | Status: AC
  Administered 2024-05-12 – 2024-05-14 (×3): 750 mg
  Filled 2024-05-11 (×3): qty 1

## 2024-05-11 MED ORDER — FUROSEMIDE 10 MG/ML IJ SOLN
40.0000 mg | Freq: Two times a day (BID) | INTRAMUSCULAR | Status: DC
  Administered 2024-05-12 – 2024-05-14 (×4): 40 mg via INTRAVENOUS
  Filled 2024-05-11 (×5): qty 4

## 2024-05-11 MED ORDER — FREE WATER: 200.0000 mL | Freq: Four times a day (QID) | Status: DC

## 2024-05-11 MED ORDER — POLYETHYLENE GLYCOL 3350 17 G PO PACK: 17.0000 g | PACK | Freq: Every day | ORAL | Status: DC | PRN

## 2024-05-11 MED ORDER — METOPROLOL TARTRATE 25 MG PO TABS
25.0000 mg | ORAL_TABLET | Freq: Two times a day (BID) | ORAL | Status: DC
Start: 2024-05-11 — End: 2024-05-14
  Administered 2024-05-11 – 2024-05-14 (×6): 25 mg
  Filled 2024-05-11 (×6): qty 1

## 2024-05-11 MED ORDER — DOCUSATE SODIUM 50 MG/5ML PO LIQD
100.0000 mg | Freq: Every day | ORAL | Status: DC | PRN
  Administered 2024-05-12: 100 mg
  Filled 2024-05-11: qty 10

## 2024-05-11 MED ORDER — SENNA 8.6 MG PO TABS
2.0000 | ORAL_TABLET | Freq: Once | ORAL | Status: DC
Start: 2024-05-11 — End: 2024-05-11

## 2024-05-11 NOTE — Progress Notes (Signed)
 Physical Therapy Treatment Patient Details Name: Gregory Shields MRN: [id number] DOB: 11-03-1950 Today's Date: 05/11/2024   History of Present Illness Pt is a 73 y.o. male presenting 9/30 after being found having questionable seizure by bystander in his back yard. Intubated on arrival for airway protection. 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 MRI 10/1 with acute right pontine infarcts, left thalamic and bilateral cerebellar infarcts,  Right side pontine hemorrhage. MRA with Right vertebral artery occlusion, Proximal occlusion left PCA. EEG with severe diffuse encephalopathy, no seizure 10/2. UDS with THC.extubated 10/6. 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    PT Comments  Pt lethargic with eyes closed for a majority of session. Pt with very little command following and gives no indication of yes/no to PT questions today, only command following observed was wiggling R toes and possibly R knee flexion, though PT questions hypertonicity for knee flexion. Pt tolerated PROM well, maintaining good ROM throughout at present. PT to continue to follow, will progress as able.     If plan is discharge home, recommend the following: Two people to help with walking and/or transfers;Two people to help with bathing/dressing/bathroom;Assistance with cooking/housework;Assistance with feeding;Direct supervision/assist for medications management;Direct supervision/assist for financial management;Assist for transportation;Help with stairs or ramp for entrance;Supervision due to cognitive status   Can travel by private vehicle        Equipment Recommendations  [place] lift;Hospital bed;Wheelchair (measurements PT);Wheelchair cushion (measurements PT)    Recommendations for Other Services       Precautions / Restrictions Precautions Precautions: Fall Recall of Precautions/Restrictions:  Impaired Precaution/Restrictions Comments: rectal pouch, phagenyx line, cortrak Restrictions Weight Bearing Restrictions Per Provider Order: No     Mobility  Bed Mobility               General bed mobility comments: bed-level exercise    Transfers                        Ambulation/Gait                   Stairs             Wheelchair Mobility     Tilt Bed    Modified Rankin (Stroke Patients Only) Modified Rankin (Stroke Patients Only) Pre-Morbid Rankin Score: No symptoms Modified Rankin: Severe disability     Balance                                            Communication Communication Communication: Impaired Factors Affecting Communication: Difficulty expressing self;Reduced clarity of speech  Cognition Arousal: Lethargic Behavior During Therapy: Flat affect   PT - Cognitive impairments: Difficult to assess Difficult to assess due to: Impaired communication                     PT - Cognition Comments: no indication of yes/no, questionable and inconsistent command following RLE but less certain vs yesterday's session Following commands: Impaired Following commands impaired: Follows one step commands inconsistently (with fatigue becomes more inconsistent)    Cueing Cueing Techniques: Verbal cues, Gestural cues, Tactile cues, Visual cues  Exercises General Exercises - Lower Extremity Ankle Circles/Pumps: PROM, Both, 15 reps, Supine Heel Slides: PROM, Both, 15 reps, Supine Hip ABduction/ADduction: PROM, Both, 15 reps, Supine (+  hip fallouts for hip adductor stretch, 2x30 sec bilat) Shoulder Exercises Elbow Flexion: PROM, Both, 15 reps, Supine Elbow Extension: PROM, Both, 15 reps, Supine Wrist Flexion: PROM, Both, 15 reps, Supine Wrist Extension: PROM, Both, 15 reps, Supine    General Comments General comments (skin integrity, edema, etc.): vss on 4LO2, afib      Pertinent Vitals/Pain Pain  Assessment Pain Assessment: Faces Faces Pain Scale: No hurt    Home Living                          Prior Function            PT Goals (current goals can now be found in the care plan section) Acute Rehab PT Goals Patient Stated Goal: unable to state PT Goal Formulation: Patient unable to participate in goal setting Time For Goal Achievement: 05/20/24 Potential to Achieve Goals: Fair Progress towards PT goals: Not progressing toward goals - comment (little to no command following this date)    Frequency    Min 3X/week      PT Plan      Co-evaluation              AM-PAC PT 6 Clicks Mobility   Outcome Measure  Help needed turning from your back to your side while in a flat bed without using bedrails?: Total Help needed moving from lying on your back to sitting on the side of a flat bed without using bedrails?: Total Help needed moving to and from a bed to a chair (including a wheelchair)?: Total Help needed standing up from a chair using your arms (e.g., wheelchair or bedside chair)?: Total Help needed to walk in hospital room?: Total Help needed climbing 3-5 steps with a railing? : Total 6 Click Score: 6    End of Session Equipment Utilized During Treatment: Oxygen Activity Tolerance: Patient limited by lethargy Patient left: in bed;with call bell/phone within reach;with bed alarm set;with nursing/sitter in room;with family/visitor present Nurse Communication: Mobility status PT Visit Diagnosis: Muscle weakness (generalized) (M62.81);Difficulty in walking, not elsewhere classified (R26.2);Other symptoms and signs involving the nervous system (R29.898)     Time: 8481-8466 PT Time Calculation (min) (ACUTE ONLY): 15 min  Charges:    $Therapeutic Exercise: 8-22 mins PT General Charges $$ ACUTE PT VISIT: 1 Visit                     Johana RAMAN, PT DPT Acute Rehabilitation Services Secure Chat Preferred  Office [phone]    Nazaret Chea E  Johna 05/11/2024, 4:50 PM

## 2024-05-11 NOTE — Plan of Care (Signed)
  Problem: Ischemic Stroke/TIA Tissue Perfusion: Goal: Complications of ischemic stroke/TIA will be minimized Outcome: Not Progressing   Problem: Health Behavior/Discharge Planning: Goal: Goals will be collaboratively established with patient/family Outcome: Progressing   Problem: Clinical Measurements: Goal: Ability to maintain clinical measurements within normal limits will improve Outcome: Progressing Goal: Respiratory complications will improve Outcome: Progressing Goal: Cardiovascular complication will be avoided Outcome: Progressing   Problem: Clinical Measurements: Goal: Respiratory complications will improve Outcome: Progressing   Problem: Clinical Measurements: Goal: Cardiovascular complication will be avoided Outcome: Progressing   Problem: Coping: Goal: Level of anxiety will decrease Outcome: Progressing

## 2024-05-11 NOTE — Procedures (Signed)
 Objective Swallowing Evaluation: Type of Study: FEES-Fiberoptic Endoscopic Evaluation of Swallow   Patient Details  Name: Gregory Shields MRN: [id number] Date of Birth: 1951/03/01  Today's Date: 05/11/2024 Time: SLP Start Time (ACUTE ONLY): 1044 -SLP Stop Time (ACUTE ONLY): 1110  SLP Time Calculation (min) (ACUTE ONLY): 26 min   Past Medical History:  Past Medical History:  Diagnosis Date   Coronary artery disease    Depression    Hypertension    Myocardial infarction (HCC)    Sleep apnea    Stroke [facility])    Past Surgical History:  Past Surgical History:  Procedure Laterality Date   IR PERCUTANEOUS ART THROMBECTOMY/INFUSION INTRACRANIAL INC DIAG ANGIO  05/02/2024   IR US  GUIDE VASC ACCESS RIGHT  05/02/2024   LEFT HEART CATHETERIZATION WITH CORONARY ANGIOGRAM N/A 05/12/2014   Procedure: LEFT HEART CATHETERIZATION WITH CORONARY ANGIOGRAM;  Surgeon: Rober LOISE Chroman, MD;  Location: [facility] CATH LAB;  Service: Cardiovascular;  Laterality: N/A;   RADIOLOGY WITH ANESTHESIA N/A 05/02/2024   Procedure: RADIOLOGY WITH ANESTHESIA;  Surgeon: Dolphus Carrion, MD;  Location: [facility] OR;  Service: Radiology;  Laterality: N/A;   HPI: 73 year old male admitted 9/30, found unresponsive in yard. 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 Extubated on 10/6. PMH significant for coronary artery disease, depression, hypertension, sleep apnea, and CVA.   No data recorded   Assessment / Plan / Recommendation     05/11/2024    4:00 PM  Clinical Impressions  Clinical Impression Pt demonstrates severe oropharyngeal dysphagia with decreased secretion managment. Pooled thick secretions found in valleculae which loosened and mobilized with oral care and ice chip trials. Pt was very lethargic during  FEES and oral manipulation of ice chips was passive with anterior spillage and passive posterior spillage of melted ice. Pt had swallow response to arrival of melted ice in vestibule as well as prolonged hard coughing. Observed decreased epiglottic deflection, laryngeal elevation, decreased contact of base of tongue to posterior pharyngeal wall and significant residue particularly above PES suggesting decreased PES opening. PAS 4 and 6, though given poor clearance of ejected aspirate recurrent aspiration with sensation also occurred. Study very limited. May benefit from repeat testing when/if appropriately alert and able to orally manipulate more textures. Will f/u for trials.  SLP Visit Diagnosis Dysphagia, oropharyngeal phase (R13.12)  Attention and concentration deficit following --  Frontal lobe and executive function deficit following --  Impact on safety and function Severe aspiration risk         05/11/2024    4:00 PM  Treatment Recommendations  Treatment Recommendations Therapy as outlined in treatment plan below        05/11/2024    4:00 PM  Prognosis  Prognosis for improved oropharyngeal function Guarded  Barriers to Reach Goals Severity of deficits  Barriers/Prognosis Comment --       05/11/2024    4:00 PM  Diet Recommendations  SLP Diet Recommendations NPO;Alternative means - temporary  Liquid Administration via --  Medication Administration --  Compensations --  Postural Changes --         05/11/2024    4:00 PM  Other Recommendations  Recommended Consults --  Oral Care Recommendations Oral care QID  Caregiver Recommendations --  Follow Up Recommendations Skilled nursing-short term rehab (<3 hours/day)  Assistance recommended at discharge --  Functional Status  Assessment Patient has had a recent decline in their functional status and demonstrates the ability to make significant improvements in function in a reasonable and predictable amount of time.        05/11/2024    4:00 PM  Frequency and Duration   Speech Therapy Frequency (ACUTE ONLY) min 2x/week  Treatment Duration 2 weeks         05/11/2024    4:00 PM  Oral Phase  Oral Phase Impaired  Oral - Pudding Teaspoon --  Oral - Pudding Cup --  Oral - Honey Teaspoon --  Oral - Honey Cup --  Oral - Nectar Teaspoon --  Oral - Nectar Cup --  Oral - Nectar Straw --  Oral - Thin Teaspoon --  Oral - Thin Cup --  Oral - Thin Straw --  Oral - Puree --  Oral - Mech Soft --  Oral - Regular --  Oral - Multi-Consistency --  Oral - Pill --  Oral Phase - Comment --       05/11/2024    4:00 PM  Pharyngeal Phase  Pharyngeal Phase Impaired  Pharyngeal- Pudding Teaspoon --  Pharyngeal --  Pharyngeal- Pudding Cup --  Pharyngeal --  Pharyngeal- Honey Teaspoon --  Pharyngeal --  Pharyngeal- Honey Cup --  Pharyngeal --  Pharyngeal- Nectar Teaspoon --  Pharyngeal --  Pharyngeal- Nectar Cup --  Pharyngeal --  Pharyngeal- Nectar Straw --  Pharyngeal --  Pharyngeal- Thin Teaspoon Reduced epiglottic inversion;Reduced laryngeal elevation;Reduced anterior laryngeal mobility;Reduced tongue base retraction;Penetration/Aspiration during swallow;Penetration/Apiration after swallow;Moderate aspiration;Pharyngeal residue - valleculae;Inter-arytenoid space residue  Pharyngeal Material enters airway, passes BELOW cords then ejected out;Material enters airway, CONTACTS cords and then ejected out  Pharyngeal- Thin Cup --  Pharyngeal --  Pharyngeal- Thin Straw --  Pharyngeal --  Pharyngeal- Puree --  Pharyngeal --  Pharyngeal- Mechanical Soft --  Pharyngeal --  Pharyngeal- Regular --  Pharyngeal --  Pharyngeal- Multi-consistency --  Pharyngeal --  Pharyngeal- Pill --  Pharyngeal --  Pharyngeal Comment --         No data to display          Consuelo Fort, MA CCC-SLP  Acute Rehabilitation Services Secure Chat Preferred Office [phone]  [name] 05/11/2024, 5:51 PM

## 2024-05-11 NOTE — Plan of Care (Signed)
  Problem: Ischemic Stroke/TIA Tissue Perfusion: Goal: Complications of ischemic stroke/TIA will be minimized Outcome: Progressing   Problem: Health Behavior/Discharge Planning: Goal: Goals will be collaboratively established with patient/family Outcome: Progressing   Problem: Nutrition: Goal: Risk of aspiration will decrease Outcome: Progressing Goal: Dietary intake will improve Outcome: Progressing   Problem: Nutrition: Goal: Adequate nutrition will be maintained Outcome: Progressing   Problem: Coping: Goal: Level of anxiety will decrease Outcome: Progressing   Problem: Elimination: Goal: Will not experience complications related to bowel motility Outcome: Progressing Goal: Will not experience complications related to urinary retention Outcome: Progressing   Problem: Pain Managment: Goal: General experience of comfort will improve and/or be controlled Outcome: Progressing   Problem: Safety: Goal: Ability to remain free from injury will improve Outcome: Progressing   Problem: Skin Integrity: Goal: Risk for impaired skin integrity will decrease Outcome: Progressing

## 2024-05-11 NOTE — Progress Notes (Addendum)
 STROKE TEAM PROGRESS NOTE    SIGNIFICANT HOSPITAL EVENTS   9/30:    Presented after witnessed seizure by neighbor and EMS.              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   INTERIM HISTORY/SUBJECTIVE No family at the bedside.  No new neurological events overnight This morning he is not slightly more lethargic but awakens easily to stimulation And remains on heparin  drip He is stable to transfer out of ICU awaiting available bed  CBC    Component Value Date/Time   WBC 11.9 (H) 05/11/2024 0439   RBC 5.25 05/11/2024 0439   HGB 16.7 05/11/2024 0439   HCT 47.4 05/11/2024 0439   PLT 290 05/11/2024 0439   MCV 90.3 05/11/2024 0439   MCH 31.8 05/11/2024 0439   MCHC 35.2 05/11/2024 0439   RDW 12.3 05/11/2024 0439   LYMPHSABS 1.0 05/02/2024 1851   MONOABS 0.5 05/02/2024 1851   EOSABS 0.0 05/02/2024 1851   BASOSABS 0.0 05/02/2024 1851    BMET    Component Value Date/Time   NA 138 05/11/2024 0439   NA 139 06/21/2020 1650   K 4.0 05/11/2024 0439   CL 102 05/11/2024 0439   CO2 22 05/11/2024 0439   GLUCOSE 151 (H) 05/11/2024 0439   BUN 40 (H) 05/11/2024 0439   BUN 13 06/21/2020 1650   CREATININE 1.34 (H) 05/11/2024 0439   CALCIUM  8.8 (L) 05/11/2024 0439   GFRNONAA 57 (L) 05/11/2024 0439    IMAGING past 24 hours DG Abd Portable 1V Result Date: 05/10/2024 CLINICAL DATA:  Feeding tube placement. EXAM: PORTABLE ABDOMEN - 1 VIEW COMPARISON:  05/09/2024. FINDINGS: Feeding tube terminates in the gastric antrum. A second small bore tube terminates in the distal stomach. Bowel gas pattern is unremarkable. IMPRESSION: Two enteric tubes terminating in the gastric antrum and distal stomach, respectively. Are Electronically Signed   By: Newell Eke  M.D.   On: 05/10/2024 13:06    Vitals:   05/11/24 0300 05/11/24 0400 05/11/24 0700 05/11/24 0800  BP: 116/86 122/81 114/75 119/69  Pulse: (!) 121 (!) 109 (!) 106 (!) 119  Resp: (!) 24 (!) 22 (!) 22 (!) 24  Temp:  99.3 F (37.4 C)  99.3 F (37.4 C)  TempSrc:  Axillary  Axillary  SpO2: 94% 93% 92% 95%  Weight:      Height:         PHYSICAL EXAM General:  critically ill  elderly male  Psych:  Mood and affect appropriate for situation CV: Regular rate and rhythm on monitor Respiratory:  Regular, unlabored respirations GI: Abdomen soft and nontender   NEURO:  Mental Status:  Lethargic, Eyes are closed, awakens easily to stimulation , tracking at times, dysconjugate gaze  following simple commands intermittently   Cranial Nerves:  II: PERRL. Blinks bilaterally  III, IV, VI: With passive eye raise, conjugate gaze V, VII: ETT in place. Corneal reflex intact, greater on right IX, X: Cough and gag reflexes intact Motor/sensory:  moves right side spontaneously and purposeful, left leg is semi purposeful, Strong withdrawal seen RLE.  Coordination: Unable to assess Gait- deferred   Most Recent NIH 28     ASSESSMENT/PLAN [name] is a 73 y.o.  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 Now s/p TICI 2B. Admitted for ICU monitoring and stroke workup. NIH on Admission: 28.   Stroke: posterior circulation infarcts with BA occlusion s/p IR with TICI 2B, etiology likely Afib not on AC  CT head No acute abnormality.  Possible remote infarct right corona radiata, extending into basal ganglia.  Small remote infarct right cerebellum. CTA head & neck with perfusion Positive CTA for acute basilar thrombosis.  Right SCA possibly occluded as it is not visualized.  Occlusion of left PCA at proximal left P2 segment.  Occlusion of r hide  ight vertebral artery at origin, remains occluded at vertebral basilar junction. Post IR CT head No evidence of acute infarct or intracranial hemorrhage MRI: Acute right pontine infarcts, left thalamic and bilateral cerebellar infarcts. Right side pontine hemorrhage MRA  Right vertebral artery occlusion, Proximal occlusion left PCA Repeat CT head Multiple recent infarcts of the cerebellum, left thalamus, and right pons,  2D Echo EF 40-45% LDL 124 HgbA1c 4.5 UDS + THC  VTE prophylaxis -IV heparin   aspirin  325 mg daily Eliquis  (noncompliant) prior to admission,on IV heparin   Therapy recommendations:  Pending Disposition:  pending   Acute respiratory failure Possible ASp Pneumonia  Decreased ability to protect airway secondary to multiple acute infarcts CCM management, appreciate assistance Mental status precluding extubation at this time  On unasyn  Extubated 10/6 with no reintubation  Lots of copious secretions    Seizures vs. Posturing  Likely posturing secondary to brainstem stroke, low concern for seizure. Status post Keppra 4.25 g load 9/30 Continue Keppra maintenance dose of 1 g twice daily LTM EEG negative for seizures, will discontinue On propofol, wean as tolerated. OK to transition to Precedex.    Paroxsymal Atrial fibrillation Noted in multiple prior notes is not taking eliquis  due to patient choice. Hx of LV thrombus in 2015 Outpatient cardiology appointment 2021 notes that patient stopped taking his Eliquis  approximately a year ago and is only taking aspirin . 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 Started on Eliquis  at this time as well as aspirin  and Brilinta . Outpatient cardiology appointment 2021 notes that patient stopped taking his Eliquis  approximately a year ago and is only taking aspirin .    Hypertension CHF reduced EF Home meds: None Phenylephrine off  Blood pressure goal <160   Hyperlipidemia Home meds: None LDL 124, goal < 70 Add Lipitor  40 mg  Continue statin at discharge  Dysphagia Patient has post-stroke dysphagia, SLP consulted    Diet   Diet NPO time specified   Other Stroke Risk Factors Advanced age greater than/equal to 68 CAD/MI with hx of LV thrombus Obstructive sleep apnea, not on CPAP at home Cardiomyopathy with EF 40-45%   Other Active Problems Leukocytosis 12.2--9.4--12.3--10.8-11.9, Afeb  Hospital day # 9   Karna Geralds DNP, ACNPC-AG  [facility]  I have personally obtained history,examined this patient, reviewed notes, independently viewed imaging studies, participated in medical decision making and plan of care.ROS completed by me personally and pertinent positives fully documented  I have made any additions or clarifications directly to the above note. Agree with note above.  Patient continues to have dysphagia and is getting phagenyx catheter stimulation.  Medically stable to  transfer to rehab when bed available.  Eather Popp, MD Medical Director [facility] Pager: [phone] 05/11/2024 4:36 PM  To contact Stroke Continuity provider, please refer to [email or web address]. After hours, contact General Neurology

## 2024-05-11 NOTE — Progress Notes (Signed)
 Speech Language Pathology Treatment: Dysphagia;Cognitive-Linguistic  Patient Details Name: Gregory Shields MRN: [id number] DOB: [birth year] Today's Date: 05/11/2024 Time: 1110-1150 SLP Time Calculation (min) (ACUTE ONLY): 40 min  Assessment / Plan / Recommendation Clinical Impression  Objective: Pt still lethargic today, seen for phagenyx and speech therapy immediately following FEES. Pt most alert and attentive when wife at bedside.  Prior to initiation of Phagenyx therapy, SLP verified the patient's NEX (nasal to earlobe xiphisternum) number of 64 (verified through visualization between the nasal bands).Patient successfully completed 2 of 3 Pharyngeal Electrical Stimulation (PES) treatment sessions (Cycle # 1).Patient tolerated Phagenyx treatment for full treatment session (10 minutes) at 41mA (stimulation target >39mA) without need for rest breaks.  Pt observed to frequently cough, swallow and clear throat during phagenyx treatment. Periods of alertness observed. Pt less consistent with command following, but did nod and shake head to Y/N questions x2. Prognosis still guarded, pt has not demonstrated appropriate arousal for judgements about improved secretion management today.    Assessment: SLP performed pre-treatment testing. The stimulation level was determined to be 41mA (tolerance level - threshold level) x.75) + threshold.     HPI HPI: 73 year old male admitted 9/30, found unresponsive in yard. 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 Extubated on 10/6. PMH significant for coronary artery disease, depression, hypertension, sleep apnea, and CVA.      SLP Plan  Continue with current plan of care          Recommendations  Diet recommendations: NPO                               Continue with current plan of care     Baby Gieger, [name]  05/11/2024, 3:58 PM

## 2024-05-12 DIAGNOSIS — I639 Cerebral infarction, unspecified: Secondary | ICD-10-CM

## 2024-05-12 DIAGNOSIS — I63543 Cerebral infarction due to unspecified occlusion or stenosis of bilateral cerebellar arteries: Secondary | ICD-10-CM | POA: Diagnosis not present

## 2024-05-12 DIAGNOSIS — I613 Nontraumatic intracerebral hemorrhage in brain stem: Secondary | ICD-10-CM | POA: Diagnosis not present

## 2024-05-12 DIAGNOSIS — I6322 Cerebral infarction due to unspecified occlusion or stenosis of basilar arteries: Secondary | ICD-10-CM | POA: Diagnosis not present

## 2024-05-12 DIAGNOSIS — Z515 Encounter for palliative care: Secondary | ICD-10-CM

## 2024-05-12 DIAGNOSIS — Z7189 Other specified counseling: Secondary | ICD-10-CM

## 2024-05-12 DIAGNOSIS — J9600 Acute respiratory failure, unspecified whether with hypoxia or hypercapnia: Secondary | ICD-10-CM

## 2024-05-12 DIAGNOSIS — I6329 Cerebral infarction due to unspecified occlusion or stenosis of other precerebral arteries: Secondary | ICD-10-CM | POA: Diagnosis not present

## 2024-05-12 LAB — CBC
HCT: 48.6 % (ref 39.0–52.0)
Hemoglobin: 17.1 g/dL — ABNORMAL HIGH (ref 13.0–17.0)
MCH: 32 pg (ref 26.0–34.0)
MCHC: 35.2 g/dL (ref 30.0–36.0)
MCV: 90.8 fL (ref 80.0–100.0)
Platelets: 304 K/uL (ref 150–400)
RBC: 5.35 MIL/uL (ref 4.22–5.81)
RDW: 12.5 % (ref 11.5–15.5)
WBC: 11.1 K/uL — ABNORMAL HIGH (ref 4.0–10.5)
nRBC: 0 % (ref 0.0–0.2)

## 2024-05-12 LAB — GLUCOSE, CAPILLARY
Glucose-Capillary: 125 mg/dL — ABNORMAL HIGH (ref 70–99)
Glucose-Capillary: 131 mg/dL — ABNORMAL HIGH (ref 70–99)
Glucose-Capillary: 139 mg/dL — ABNORMAL HIGH (ref 70–99)
Glucose-Capillary: 141 mg/dL — ABNORMAL HIGH (ref 70–99)
Glucose-Capillary: 143 mg/dL — ABNORMAL HIGH (ref 70–99)
Glucose-Capillary: 143 mg/dL — ABNORMAL HIGH (ref 70–99)

## 2024-05-12 LAB — BASIC METABOLIC PANEL WITH GFR
Anion gap: 11 (ref 5–15)
BUN: 44 mg/dL — ABNORMAL HIGH (ref 8–23)
CO2: 24 mmol/L (ref 22–32)
Calcium: 8.8 mg/dL — ABNORMAL LOW (ref 8.9–10.3)
Chloride: 106 mmol/L (ref 98–111)
Creatinine, Ser: 1.23 mg/dL (ref 0.61–1.24)
GFR, Estimated: >60 mL/min (ref >60)
Glucose, Bld: 148 mg/dL — ABNORMAL HIGH (ref 70–99)
Potassium: 4 mmol/L (ref 3.5–5.1)
Sodium: 141 mmol/L (ref 135–145)

## 2024-05-12 LAB — SUSCEPTIBILITY RESULT

## 2024-05-12 LAB — SUSCEPTIBILITY, AER + ANAEROB

## 2024-05-12 NOTE — Plan of Care (Signed)
  Problem: Education: Goal: Knowledge of disease or condition will improve 05/12/2024 2241 by Pearly Doffing, RN Outcome: Progressing 05/12/2024 2240 by Pearly Doffing, RN Outcome: Progressing Goal: Knowledge of secondary prevention will improve (MUST DOCUMENT ALL) 05/12/2024 2241 by Pearly Doffing, RN Outcome: Progressing 05/12/2024 2240 by Pearly Doffing, RN Outcome: Progressing Goal: Knowledge of patient specific risk factors will improve (DELETE if not current risk factor) 05/12/2024 2241 by Pearly Doffing, RN Outcome: Progressing 05/12/2024 2240 by Pearly Doffing, RN Outcome: Progressing   Problem: Ischemic Stroke/TIA Tissue Perfusion: Goal: Complications of ischemic stroke/TIA will be minimized 05/12/2024 2241 by Pearly Doffing, RN Outcome: Progressing 05/12/2024 2240 by Pearly Doffing, RN Outcome: Progressing   Problem: Coping: Goal: Will verbalize positive feelings about self 05/12/2024 2241 by Pearly Doffing, RN Outcome: Progressing 05/12/2024 2240 by Pearly Doffing, RN Outcome: Progressing Goal: Will identify appropriate support needs 05/12/2024 2241 by Pearly Doffing, RN Outcome: Progressing 05/12/2024 2240 by Pearly Doffing, RN Outcome: Progressing   Problem: Health Behavior/Discharge Planning: Goal: Ability to manage health-related needs will improve 05/12/2024 2241 by Pearly Doffing, RN Outcome: Progressing 05/12/2024 2240 by Pearly Doffing, RN Outcome: Progressing Goal: Goals will be collaboratively established with patient/family 05/12/2024 2241 by Pearly Doffing, RN Outcome: Progressing 05/12/2024 2240 by Pearly Doffing, RN Outcome: Progressing   Problem: Self-Care: Goal: Ability to participate in self-care as condition permits will improve 05/12/2024 2241 by Pearly Doffing, RN Outcome: Progressing 05/12/2024 2240 by Pearly Doffing, RN Outcome: Progressing Goal: Verbalization of feelings and concerns over difficulty with self-care will improve 05/12/2024 2241 by  Pearly Doffing, RN Outcome: Progressing 05/12/2024 2240 by Pearly Doffing, RN Outcome: Progressing Goal: Ability to communicate needs accurately will improve 05/12/2024 2241 by Pearly Doffing, RN Outcome: Progressing 05/12/2024 2240 by Pearly Doffing, RN Outcome: Progressing   Problem: Nutrition: Goal: Risk of aspiration will decrease 05/12/2024 2241 by Pearly Doffing, RN Outcome: Progressing 05/12/2024 2240 by Pearly Doffing, RN Outcome: Progressing Goal: Dietary intake will improve 05/12/2024 2241 by Pearly Doffing, RN Outcome: Progressing 05/12/2024 2240 by Pearly Doffing, RN Outcome: Progressing   Problem: Education: Goal: Knowledge of General Education information will improve Description: Including pain rating scale, medication(s)/side effects and non-pharmacologic comfort measures 05/12/2024 2241 by Pearly Doffing, RN Outcome: Progressing 05/12/2024 2240 by Pearly Doffing, RN Outcome: Progressing   Problem: Health Behavior/Discharge Planning: Goal: Ability to manage health-related needs will improve 05/12/2024 2241 by Pearly Doffing, RN Outcome: Progressing 05/12/2024 2240 by Pearly Doffing, RN Outcome: Progressing   Problem: Activity: Goal: Risk for activity intolerance will decrease 05/12/2024 2241 by Pearly Doffing, RN Outcome: Progressing 05/12/2024 2240 by Pearly Doffing, RN Outcome: Progressing

## 2024-05-12 NOTE — Care Management Important Message (Signed)
 Important Message  Patient Details  Name: Gregory Shields MRN: [id number] Date of Birth: [birth year]   Important Message Given:  Yes - Medicare IM     Claretta Deed 05/12/2024, 3:31 PM

## 2024-05-12 NOTE — Plan of Care (Signed)
  Problem: Ischemic Stroke/TIA Tissue Perfusion: Goal: Complications of ischemic stroke/TIA will be minimized Outcome: Progressing   Problem: Health Behavior/Discharge Planning: Goal: Goals will be collaboratively established with patient/family Outcome: Progressing   Problem: Nutrition: Goal: Risk of aspiration will decrease Outcome: Progressing Goal: Dietary intake will improve Outcome: Progressing   Problem: Clinical Measurements: Goal: Will remain free from infection Outcome: Progressing Goal: Diagnostic test results will improve Outcome: Progressing Goal: Respiratory complications will improve Outcome: Progressing Goal: Cardiovascular complication will be avoided Outcome: Progressing   Problem: Activity: Goal: Risk for activity intolerance will decrease Outcome: Progressing   Problem: Nutrition: Goal: Adequate nutrition will be maintained Outcome: Progressing   Problem: Education: Goal: Knowledge of disease or condition will improve Outcome: Not Progressing Goal: Knowledge of secondary prevention will improve (MUST DOCUMENT ALL) Outcome: Not Progressing Goal: Knowledge of patient specific risk factors will improve (DELETE if not current risk factor) Outcome: Not Progressing   Problem: Coping: Goal: Will verbalize positive feelings about self Outcome: Not Progressing Goal: Will identify appropriate support needs Outcome: Not Progressing   Problem: Health Behavior/Discharge Planning: Goal: Ability to manage health-related needs will improve Outcome: Not Progressing   Problem: Self-Care: Goal: Ability to participate in self-care as condition permits will improve Outcome: Not Progressing Goal: Verbalization of feelings and concerns over difficulty with self-care will improve Outcome: Not Progressing Goal: Ability to communicate needs accurately will improve Outcome: Not Progressing   Problem: Education: Goal: Knowledge of General Education information will  improve Description: Including pain rating scale, medication(s)/side effects and non-pharmacologic comfort measures Outcome: Not Progressing   Problem: Health Behavior/Discharge Planning: Goal: Ability to manage health-related needs will improve Outcome: Not Progressing

## 2024-05-12 NOTE — Progress Notes (Signed)
 Speech Language Pathology Treatment: Dysphagia  Patient Details Name: Gregory Shields MRN: [id number] DOB: [birth year] Today's Date: 05/12/2024 Time: 8686-8648 SLP Time Calculation (min) (ACUTE ONLY): 38 min  Assessment / Plan / Recommendation Clinical Impression  Objective:  Patient seen for dysphagia treatment including Phagenyx therapy. He remains lethargic. He is breathing with a mouth open posture with dried secretions, tinted green from FEES, still adhered to hard palate. SLP provided oral care and water via swab for additional moisture.   Prior to initiation of Phagenyx therapy, SLP verified correct placement of electrodes through visualization of black bands, and the patient's NEX (nasal to earlobe xiphisternum) number of 64 (verified through visualization between the nasal bands).   Patient successfully completed 3 of 3 Pharyngeal Electrical Stimulation (PES) treatment sessions (Cycle # 1). Patient maintained electrode placement throughout treatment without need for adjustments. Patient tolerated Phagenyx treatment for full treatment session (10 minutes) at 39 mA (stimulation target >67mA) without need for rest breaks.   Clinical Observations:  Pt has increased swallowing frequency throughout treatment with increased coughing as well. Cough was productive of secretions x2 with SLP using yankauer to remove from his posterior oral cavity. He slept through a lot of treatment today, but initially, when a little alert, had a similar extensor response to oral care and stimulation.    Assessment:  SLP performed pre-treatment testing. The threshold level was determined to be 4mA based on the average of 3 threshold tests. The tolerance level was determined to be 50mA based on the average of 3 tolerance tests. The stimulation level was determined to be 39mA (tolerance level - threshold level) x.75) + threshold.   HPI HPI: 73 year old male admitted 9/30, found unresponsive in yard. Intubated on  arrival, basilar artery thrombus was discovered and pt underwent status post mechanical thrombectomy.   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 Extubated on 10/6. PMH significant for coronary artery disease, depression, hypertension, sleep apnea, and CVA.      SLP Plan  Continue with current plan of care          Recommendations  Diet recommendations: NPO Medication Administration: Via alternative means                  Oral care QID;Staff/trained caregiver to provide oral care     Dysphagia, oropharyngeal phase (R13.12)     Continue with current plan of care     Leita SAILOR., M.A. CCC-SLP Acute Rehabilitation Services Office: [phone]  Secure chat preferred   05/12/2024, 2:20 PM

## 2024-05-12 NOTE — Plan of Care (Signed)
 Problem: Education: Goal: Knowledge of disease or condition will improve 05/12/2024 1733 by Jobie Dena RAMAN, RN Outcome: Not Progressing 05/12/2024 1733 by Jobie Dena RAMAN, RN Outcome: Progressing Goal: Knowledge of secondary prevention will improve (MUST DOCUMENT ALL) 05/12/2024 1733 by Jobie Dena RAMAN, RN Outcome: Not Progressing 05/12/2024 1733 by Jobie Dena RAMAN, RN Outcome: Progressing Goal: Knowledge of patient specific risk factors will improve (DELETE if not current risk factor) 05/12/2024 1733 by Jobie Dena RAMAN, RN Outcome: Not Progressing 05/12/2024 1733 by Jobie Dena RAMAN, RN Outcome: Progressing   Problem: Coping: Goal: Will verbalize positive feelings about self 05/12/2024 1733 by Jobie Dena RAMAN, RN Outcome: Not Progressing 05/12/2024 1733 by Jobie Dena RAMAN, RN Outcome: Progressing Goal: Will identify appropriate support needs 05/12/2024 1733 by Jobie Dena RAMAN, RN Outcome: Not Progressing 05/12/2024 1733 by Jobie Dena RAMAN, RN Outcome: Progressing   Problem: Health Behavior/Discharge Planning: Goal: Ability to manage health-related needs will improve 05/12/2024 1733 by Jobie Dena RAMAN, RN Outcome: Not Progressing 05/12/2024 1733 by Jobie Dena RAMAN, RN Outcome: Progressing Goal: Goals will be collaboratively established with patient/family 05/12/2024 1733 by Jobie Dena RAMAN, RN Outcome: Not Progressing 05/12/2024 1733 by Jobie Dena RAMAN, RN Outcome: Progressing   Problem: Self-Care: Goal: Ability to participate in self-care as condition permits will improve 05/12/2024 1733 by Jobie Dena RAMAN, RN Outcome: Not Progressing 05/12/2024 1733 by Jobie Dena RAMAN, RN Outcome: Progressing Goal: Verbalization of feelings and concerns over difficulty with self-care will improve 05/12/2024 1733 by Jobie Dena RAMAN, RN Outcome: Not Progressing 05/12/2024 1733 by Jobie Dena RAMAN, RN Outcome: Progressing Goal:  Ability to communicate needs accurately will improve 05/12/2024 1733 by Jobie Dena RAMAN, RN Outcome: Not Progressing 05/12/2024 1733 by Jobie Dena RAMAN, RN Outcome: Progressing   Problem: Education: Goal: Knowledge of General Education information will improve Description: Including pain rating scale, medication(s)/side effects and non-pharmacologic comfort measures 05/12/2024 1733 by Jobie Dena RAMAN, RN Outcome: Not Progressing 05/12/2024 1733 by Jobie Dena RAMAN, RN Outcome: Progressing   Problem: Health Behavior/Discharge Planning: Goal: Ability to manage health-related needs will improve 05/12/2024 1733 by Jobie Dena RAMAN, RN Outcome: Not Progressing 05/12/2024 1733 by Jobie Dena RAMAN, RN Outcome: Progressing   Problem: Clinical Measurements: Goal: Ability to maintain clinical measurements within normal limits will improve 05/12/2024 1733 by Jobie Dena RAMAN, RN Outcome: Not Progressing 05/12/2024 1733 by Jobie Dena RAMAN, RN Outcome: Progressing Goal: Will remain free from infection 05/12/2024 1733 by Jobie Dena RAMAN, RN Outcome: Not Progressing 05/12/2024 1733 by Jobie Dena RAMAN, RN Outcome: Progressing Goal: Diagnostic test results will improve 05/12/2024 1733 by Jobie Dena RAMAN, RN Outcome: Not Progressing 05/12/2024 1733 by Jobie Dena RAMAN, RN Outcome: Progressing Goal: Respiratory complications will improve 05/12/2024 1733 by Jobie Dena RAMAN, RN Outcome: Not Progressing 05/12/2024 1733 by Jobie Dena RAMAN, RN Outcome: Progressing Goal: Cardiovascular complication will be avoided 05/12/2024 1733 by Jobie Dena RAMAN, RN Outcome: Not Progressing 05/12/2024 1733 by Jobie Dena RAMAN, RN Outcome: Progressing   Problem: Activity: Goal: Risk for activity intolerance will decrease 05/12/2024 1733 by Jobie Dena RAMAN, RN Outcome: Not Progressing 05/12/2024 1733 by Jobie Dena RAMAN, RN Outcome: Progressing   Problem:  Nutrition: Goal: Adequate nutrition will be maintained 05/12/2024 1733 by Jobie Dena RAMAN, RN Outcome: Not Progressing 05/12/2024 1733 by Jobie Dena RAMAN, RN Outcome: Progressing   Problem: Coping: Goal: Level of anxiety will decrease 05/12/2024 1733 by Jobie Dena RAMAN, RN Outcome: Not Progressing 05/12/2024 1733 by Jobie Dena RAMAN, RN Outcome: Progressing  Problem: Elimination: Goal: Will not experience complications related to bowel motility 05/12/2024 1733 by Jobie Dena RAMAN, RN Outcome: Not Progressing 05/12/2024 1733 by Jobie Dena RAMAN, RN Outcome: Progressing Goal: Will not experience complications related to urinary retention 05/12/2024 1733 by Jobie Dena RAMAN, RN Outcome: Not Progressing 05/12/2024 1733 by Jobie Dena RAMAN, RN Outcome: Progressing   Problem: Pain Managment: Goal: General experience of comfort will improve and/or be controlled 05/12/2024 1733 by Jobie Dena RAMAN, RN Outcome: Not Progressing 05/12/2024 1733 by Jobie Dena RAMAN, RN Outcome: Progressing   Problem: Safety: Goal: Ability to remain free from injury will improve 05/12/2024 1733 by Jobie Dena RAMAN, RN Outcome: Not Progressing 05/12/2024 1733 by Jobie Dena RAMAN, RN Outcome: Progressing   Problem: Skin Integrity: Goal: Risk for impaired skin integrity will decrease 05/12/2024 1733 by Jobie Dena RAMAN, RN Outcome: Not Progressing 05/12/2024 1733 by Jobie Dena RAMAN, RN Outcome: Progressing   Problem: Education: Goal: Understanding of CV disease, CV risk reduction, and recovery process will improve 05/12/2024 1733 by Jobie Dena RAMAN, RN Outcome: Not Progressing 05/12/2024 1733 by Jobie Dena RAMAN, RN Outcome: Progressing Goal: Individualized Educational Video(s) 05/12/2024 1733 by Jobie Dena RAMAN, RN Outcome: Not Progressing 05/12/2024 1733 by Jobie Dena RAMAN, RN Outcome: Progressing   Problem: Activity: Goal: Ability to return to  baseline activity level will improve 05/12/2024 1733 by Jobie Dena RAMAN, RN Outcome: Not Progressing 05/12/2024 1733 by Jobie Dena RAMAN, RN Outcome: Progressing   Problem: Cardiovascular: Goal: Ability to achieve and maintain adequate cardiovascular perfusion will improve 05/12/2024 1733 by Jobie Dena RAMAN, RN Outcome: Not Progressing 05/12/2024 1733 by Jobie Dena RAMAN, RN Outcome: Progressing Goal: Vascular access site(s) Level 0-1 will be maintained 05/12/2024 1733 by Jobie Dena RAMAN, RN Outcome: Not Progressing 05/12/2024 1733 by Jobie Dena RAMAN, RN Outcome: Progressing   Problem: Health Behavior/Discharge Planning: Goal: Ability to safely manage health-related needs after discharge will improve 05/12/2024 1733 by Jobie Dena RAMAN, RN Outcome: Not Progressing 05/12/2024 1733 by Jobie Dena RAMAN, RN Outcome: Progressing   Problem: Activity: Goal: Ability to tolerate increased activity will improve 05/12/2024 1733 by Jobie Dena RAMAN, RN Outcome: Not Progressing 05/12/2024 1733 by Jobie Dena RAMAN, RN Outcome: Progressing   Problem: Respiratory: Goal: Ability to maintain a clear airway and adequate ventilation will improve 05/12/2024 1733 by Jobie Dena RAMAN, RN Outcome: Not Progressing 05/12/2024 1733 by Jobie Dena RAMAN, RN Outcome: Progressing   Problem: Role Relationship: Goal: Method of communication will improve 05/12/2024 1733 by Jobie Dena RAMAN, RN Outcome: Not Progressing 05/12/2024 1733 by Jobie Dena RAMAN, RN Outcome: Progressing

## 2024-05-12 NOTE — Care Management Important Message (Signed)
 Important Message  Patient Details  Name: Gregory Shields MRN: [id number] Date of Birth: 1951/07/11   Important Message Given:  Yes - Medicare IM     Claretta Deed 05/12/2024, 3:30 PM

## 2024-05-12 NOTE — Consult Note (Signed)
 Consultation Note Date: 05/12/2024   Patient Name: Gregory Shields  DOB: 11/28/1950  MRN: [id number]  Age / Sex: 73 y.o., male  PCP: Chet Mad, DO Referring Physician: Stroke, Md, MD  Reason for Consultation: Establishing goals of care  HPI/Patient Profile: 73 y.o. male  with past medical history of afib (not taking eliquis  per notes) CAD, sleep apnea, and HTN  admitted on 05/02/2024 with seizure. Per note, he was found in the back yard by bystander, vomiting profusely and begun seizing. No prior history of seizure. Patient unable to protect his airway, therefore was intubated and sedated. Imaging supportive of basilar occlusion. MRI reveals multiple infarcts with pontine hemmorhage. He underwent basilar thrombectomy on 05/02/2024.   Extubated 05/08/2024.   Now presents with severe oropharygeal dysphagia with decreased secretion management making him at very severe risk for aspiration per SLP report. He is currently NPO and on continuous artificial feeding.   PMT has been consulted to assist with goals of care conversation. Patient/Family face treatment option decisions, advanced directive decisions and anticipatory care needs.   Family face treatment option decision, advance directive decisions and anticipatory care needs.   Clinical Assessment and Goals of Care:  I have reviewed medical records including EPIC notes, labs and imaging, assessed the patient and then met with daughter Gregory Shields and spoke with wife Gregory Shields telephonically briefly  to discuss diagnosis prognosis, GOC, EOL wishes, disposition and options.  I introduced Palliative Medicine as specialized medical care for people living with serious illness. It focuses on providing relief from the symptoms and stress of a serious illness. The goal is to improve quality of life for both the patient and the family.  Patient seen lying comfortably in bed, critically ill in appearance, lethargic but  responsive to external stimuli. Daughter at bedside reports minimal communication through nodding and gestures. Patient is receiving continuous artificial nutrition via Cortrak.  Reviewed daughter's understanding of the patient's condition. She clearly described the events leading to hospitalization, including a seizure episode [place], prompting EMS activation. She is aware of the significant stroke and the challenges in recovery, as well as the current dysphagia. Evaluations are ongoing to assess safety of oral intake.  Spoke briefly with patient's wife, Gregory Shields, by phone. She agreed to attend an in-person goals of care meeting tomorrow (05/13/24 at 11 AM) with daughter Gregory Shields. Gregory Shields expressed interest in establishing a HCPOA for life insurance purposes. I explained that due to the patient's cognitive limitations, HCPOA creation may not be feasible, but offered assistance with documentation to support her insurance needs.  During the visit, Neurology rounded and discussed the current plan with the Daughter. Patient remains on short-term artificial feeding via Cortrak. The team will monitor for improvement over the weekend. If no progress is observed, options may include comfort feeding or PEG/G-tube placement, pending tomorrow's GOC discussion.  Plan for now is to continue current treatment plan, allow time for outcomes, hoping for clinical improvement.   Created space and opportunity for family to explore thoughts and feelings regarding patient's current medical condition.   The difference between aggressive medical intervention and comfort care was considered in light of the patient's goals of care. Hospice and Palliative Care services outpatient were explained and offered.   Discussed the importance of continued conversation with family and the medical providers regarding overall plan of care and treatment options, ensuring decisions are within the context of the  patient's values and GOCs.   Questions and concerns were addressed.  Hard Choices booklet left for review. The family was encouraged to call with questions or concerns.  PMT will continue to support holistically.   Social History: The patient lives with his wife, and they have been married for 40 years. He has one daughter, Gregory Shields, who describes him as strong, active, and outdoorsy. Prior to this hospitalization, he worked as a Nutritional therapist and enjoyed spending time on the farm. He has two cats and two dogs. Ten years ago, he experienced a major stroke from which he recovered well.  Functional and Nutritional State: Before admission, the patient was highly active and did not require any assistive devices for mobility. His nutritional intake was normal. Currently, he is bedbound and receiving short-term enteral nutrition via Cortrak tube.  Palliative Symptoms: Dysphagia, generalized weakness  Advance Directives: Wife mentioned that patient has an old living will. I advised Gregory Shields to bring this document tomorrow for us  to review.    Code Status: DNR-Limited.    Primary Decision Maker: Gregory Shields (wife)    SUMMARY OF RECOMMENDATIONS    Code Status: Maintain DNR- Limited Continue current scope of care Goal of care is medical stabilization and recovery to the extent this is possible Family meeting to discuss goals of care is scheduled for tomorrow, 05/13/24 at 11 AM. Patient's wife and daughter will be in attendance. Continue to provide psycho-social and emotional support to patient and family Palliative medicine team will continue to follow.   Symptom Management: Per Primary team Palliative medicine is available to assist as needed.   Palliative Prophylaxis:  Aspiration, Bowel Regimen, Delirium Protocol, Frequent Pain Assessment, Oral Care, and Turn Reposition  Prognosis:  Prognosis is guarded due major cognitive and functional decline as a result of acute stroke and chronic  medical conditions.   Discharge Planning: To Be Determined      Primary Diagnoses: Present on Admission:  Ischemic stroke [facility])    Physical Exam Vitals and nursing note reviewed.  Constitutional:      Appearance: He is ill-appearing.  Cardiovascular:     Rate and Rhythm: Tachycardia present.  Pulmonary:     Effort: Pulmonary effort is normal.  Musculoskeletal:     Comments: Generalized weakness   Skin:    General: Skin is warm and dry.  Neurological:     Comments: Lethargy, orientation UTA     Vital Signs: BP 103/76 (BP Location: Right Arm)   Pulse (!) 109   Temp 97.6 F (36.4 C) (Oral)   Resp 18   Ht 5' 8 (1.727 m)   Wt 74.2 kg   SpO2 95%   BMI 24.87 kg/m  Pain Scale: FLACC       SpO2: SpO2: 95 % O2 Device:SpO2: 95 % O2 Flow Rate: .O2 Flow Rate (L/min): 4 L/min   Palliative Assessment/Data: 10%    Total time: I spent 90 minutes in the care of the patient today in the above activities and documenting the encounter.   Detailed review of medical records (labs, imaging, vital signs), medically appropriate exam, discussed with treatment team, counseling and education to patient, family, & staff, documenting clinical information, coordination of care.     Kathlyne JULIANNA Tracie Mickey, NP  Palliative Medicine Team Team phone # [phone]  Thank you for allowing the Palliative Medicine Team to assist in the care of this patient. Please utilize secure chat with additional questions, if there is no response within 30 minutes please call the above phone number.  Palliative Medicine Team providers are available by phone  from 7am to 7pm daily and can be reached through the team cell phone.  Should this patient require assistance outside of these hours, please call the patient's attending physician.

## 2024-05-12 NOTE — Progress Notes (Addendum)
 STROKE TEAM PROGRESS NOTE    SIGNIFICANT HOSPITAL EVENTS   9/30:    Presented after witnessed seizure by neighbor and EMS.              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   INTERIM HISTORY/SUBJECTIVE Family is at the bedside. Palliative care Pa-c is at the bedside. Neurological exam remains stable and unchanged.    CBC    Component Value Date/Time   WBC 11.1 (H) 05/12/2024 0138   RBC 5.35 05/12/2024 0138   HGB 17.1 (H) 05/12/2024 0138   HCT 48.6 05/12/2024 0138   PLT 304 05/12/2024 0138   MCV 90.8 05/12/2024 0138   MCH 32.0 05/12/2024 0138   MCHC 35.2 05/12/2024 0138   RDW 12.5 05/12/2024 0138   LYMPHSABS 1.0 05/02/2024 1851   MONOABS 0.5 05/02/2024 1851   EOSABS 0.0 05/02/2024 1851   BASOSABS 0.0 05/02/2024 1851    BMET    Component Value Date/Time   NA 141 05/12/2024 0138   NA 139 06/21/2020 1650   K 4.0 05/12/2024 0138   CL 106 05/12/2024 0138   CO2 24 05/12/2024 0138   GLUCOSE 148 (H) 05/12/2024 0138   BUN 44 (H) 05/12/2024 0138   BUN 13 06/21/2020 1650   CREATININE 1.23 05/12/2024 0138   CALCIUM  8.8 (L) 05/12/2024 0138   GFRNONAA >60 05/12/2024 0138    IMAGING past 24 hours No results found.   Vitals:   05/11/24 2359 05/12/24 0330 05/12/24 0801 05/12/24 1113  BP: 104/67 118/78 123/83 103/76  Pulse: (!) 106 (!) 109 (!) 128 (!) 109  Resp:   19 18  Temp: 98.3 F (36.8 C) 99.7 F (37.6 C) 98 F (36.7 C) 97.6 F (36.4 C)  TempSrc: Oral Oral Oral Oral  SpO2: 93% 96% 94% 95%  Weight:      Height:         PHYSICAL EXAM General:  critically ill  elderly male  Psych:  Mood and affect appropriate for situation CV: Regular rate and rhythm on monitor Respiratory:  Regular, unlabored respirations GI: Abdomen  soft and nontender   NEURO:  Mental Status:  Lethargic, Eyes are closed, awakens easily to stimulation , tracking at times, dysconjugate gaze  following simple commands intermittently   Cranial Nerves:  II: PERRL. Blinks bilaterally  III, IV, VI: With passive eye raise, conjugate gaze V, VII: ETT in place. Corneal reflex intact, greater on right IX, X: Cough and gag reflexes intact Motor/sensory:  moves right side spontaneously and purposeful, left leg is semi purposeful, Strong withdrawal seen RLE.  Coordination: Unable to assess Gait- deferred   Most Recent NIH 38     ASSESSMENT/PLAN [name] is a 73 y.o. 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 Now s/p TICI 2B. Admitted for ICU monitoring and stroke workup. NIH on Admission: 28.   Stroke: posterior circulation infarcts with BA occlusion s/p IR with TICI 2B, etiology likely Afib not on AC  CT head No acute abnormality.  Possible remote infarct right corona radiata, extending into basal ganglia.  Small remote infarct right cerebellum. CTA  head & neck with perfusion Positive CTA for acute basilar thrombosis.  Right SCA possibly occluded as it is not visualized.  Occlusion of left PCA at proximal left P2 segment.  Occlusion of r hide ight vertebral artery at origin, remains occluded at vertebral basilar junction. Post IR CT head No evidence of acute infarct or intracranial hemorrhage MRI: Acute right pontine infarcts, left thalamic and bilateral cerebellar infarcts. Right side pontine hemorrhage MRA  Right vertebral artery occlusion, Proximal occlusion left PCA Repeat CT head Multiple recent infarcts of the cerebellum, left thalamus, and right pons,  2D Echo EF 40-45% LDL 124 HgbA1c 4.5 UDS + THC  VTE prophylaxis -on Eliquis   aspirin  325 mg daily Eliquis  (noncompliant)  prior to admission,on Eliquis   Therapy recommendations: SNF Disposition:  pending   Acute respiratory failure Possible ASp Pneumonia  Decreased ability to protect airway secondary to multiple acute infarcts CCM management, appreciate assistance Mental status precluding extubation at this time  On unasyn  Extubated 10/6 with no reintubation  Lots of copious secretions    Seizures vs. Posturing  Likely posturing secondary to brainstem stroke, low concern for seizure. Status post Keppra 4.25 g load 9/30 Continue Keppra maintenance dose of 1 g twice daily LTM EEG negative for seizures, will discontinue    Paroxsymal Atrial fibrillation Noted in multiple prior notes is not taking eliquis  due to patient choice. Hx of LV thrombus in 2015 Outpatient cardiology appointment 2021 notes that patient stopped taking his Eliquis  approximately a year ago and is only taking aspirin . 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 Started on Eliquis  at this time as well as aspirin  and Brilinta . Outpatient cardiology appointment 2021 notes that patient stopped taking his Eliquis  approximately a year ago and is only taking aspirin .   Hypertension CHF reduced EF Home meds: None Phenylephrine off  Blood pressure goal <160   Hyperlipidemia Home meds: None LDL 124, goal < 70 Add Lipitor  40 mg  Continue statin at discharge  Dysphagia Patient has post-stroke dysphagia, SLP consulted    Diet   Diet NPO time specified   Other Stroke Risk Factors Advanced age greater than/equal to 66 CAD/MI with hx of LV thrombus Obstructive sleep apnea, not on CPAP at home Cardiomyopathy with EF 40-45%   Other Active Problems Leukocytosis 12.2--9.4--12.3--10.8-11.9-11.1, Afeb  Hospital day # 10   Gregory Geralds DNP, ACNPC-AG  [facility]     Patient continues to have dysphagia and is getting phagenyx catheter stimulation.  Palliative care team plan to have family meeting tomorrow to discuss goals of care.  Discussed with daughter at the bedside and answered questions.  I personally spent a total of 35 minutes in the care of the patient today including getting/reviewing separately obtained history, performing a medically appropriate exam/evaluation, counseling and educating, placing orders, referring and communicating with other health care professionals, documenting clinical information in the EHR, independently interpreting results, and coordinating care.         Eather Popp, MD Medical Director [facility] Pager: [phone] 05/12/2024 1:19 PM  To contact Stroke Continuity provider, please refer to [email or web address]. After hours, contact General Neurology

## 2024-05-12 NOTE — Plan of Care (Signed)
  Problem: Education: Goal: Knowledge of disease or condition will improve 05/12/2024 2242 by Pearly Doffing, RN Outcome: Progressing 05/12/2024 2241 by Pearly Doffing, RN Outcome: Progressing 05/12/2024 2240 by Pearly Doffing, RN Outcome: Progressing Goal: Knowledge of secondary prevention will improve (MUST DOCUMENT ALL) 05/12/2024 2242 by Pearly Doffing, RN Outcome: Progressing 05/12/2024 2241 by Pearly Doffing, RN Outcome: Progressing 05/12/2024 2240 by Pearly Doffing, RN Outcome: Progressing Goal: Knowledge of patient specific risk factors will improve (DELETE if not current risk factor) 05/12/2024 2242 by Pearly Doffing, RN Outcome: Progressing 05/12/2024 2241 by Pearly Doffing, RN Outcome: Progressing 05/12/2024 2240 by Pearly Doffing, RN Outcome: Progressing   Problem: Ischemic Stroke/TIA Tissue Perfusion: Goal: Complications of ischemic stroke/TIA will be minimized 05/12/2024 2242 by Pearly Doffing, RN Outcome: Progressing 05/12/2024 2241 by Pearly Doffing, RN Outcome: Progressing 05/12/2024 2240 by Pearly Doffing, RN Outcome: Progressing   Problem: Coping: Goal: Will verbalize positive feelings about self 05/12/2024 2242 by Pearly Doffing, RN Outcome: Progressing 05/12/2024 2241 by Pearly Doffing, RN Outcome: Progressing 05/12/2024 2240 by Pearly Doffing, RN Outcome: Progressing Goal: Will identify appropriate support needs 05/12/2024 2242 by Pearly Doffing, RN Outcome: Progressing 05/12/2024 2241 by Pearly Doffing, RN Outcome: Progressing 05/12/2024 2240 by Pearly Doffing, RN Outcome: Progressing   Problem: Health Behavior/Discharge Planning: Goal: Ability to manage health-related needs will improve 05/12/2024 2242 by Pearly Doffing, RN Outcome: Progressing 05/12/2024 2241 by Pearly Doffing, RN Outcome: Progressing 05/12/2024 2240 by Pearly Doffing, RN Outcome: Progressing Goal: Goals will be collaboratively established with patient/family 05/12/2024 2242 by Pearly Doffing, RN Outcome: Progressing 05/12/2024 2241 by Pearly Doffing, RN Outcome: Progressing 05/12/2024 2240 by Pearly Doffing, RN Outcome: Progressing   Problem: Self-Care: Goal: Ability to participate in self-care as condition permits will improve 05/12/2024 2242 by Pearly Doffing, RN Outcome: Progressing 05/12/2024 2241 by Pearly Doffing, RN Outcome: Progressing 05/12/2024 2240 by Pearly Doffing, RN Outcome: Progressing Goal: Verbalization of feelings and concerns over difficulty with self-care will improve 05/12/2024 2242 by Pearly Doffing, RN Outcome: Progressing 05/12/2024 2241 by Pearly Doffing, RN Outcome: Progressing 05/12/2024 2240 by Pearly Doffing, RN Outcome: Progressing Goal: Ability to communicate needs accurately will improve 05/12/2024 2242 by Pearly Doffing, RN Outcome: Progressing 05/12/2024 2241 by Pearly Doffing, RN Outcome: Progressing 05/12/2024 2240 by Pearly Doffing, RN Outcome: Progressing   Problem: Nutrition: Goal: Risk of aspiration will decrease 05/12/2024 2242 by Pearly Doffing, RN Outcome: Progressing 05/12/2024 2241 by Pearly Doffing, RN Outcome: Progressing 05/12/2024 2240 by Pearly Doffing, RN Outcome: Progressing Goal: Dietary intake will improve 05/12/2024 2242 by Pearly Doffing, RN Outcome: Progressing 05/12/2024 2241 by Pearly Doffing, RN Outcome: Progressing 05/12/2024 2240 by Pearly Doffing, RN Outcome: Progressing   Problem: Education: Goal: Knowledge of General Education information will improve Description: Including pain rating scale, medication(s)/side effects and non-pharmacologic comfort measures 05/12/2024 2242 by Pearly Doffing, RN Outcome: Progressing 05/12/2024 2241 by Pearly Doffing, RN Outcome: Progressing 05/12/2024 2240 by Pearly Doffing, RN Outcome: Progressing

## 2024-05-13 ENCOUNTER — Inpatient Hospital Stay (HOSPITAL_COMMUNITY)

## 2024-05-13 DIAGNOSIS — R29734 NIHSS score 34: Secondary | ICD-10-CM

## 2024-05-13 LAB — GLUCOSE, CAPILLARY
Glucose-Capillary: 120 mg/dL — ABNORMAL HIGH (ref 70–99)
Glucose-Capillary: 126 mg/dL — ABNORMAL HIGH (ref 70–99)
Glucose-Capillary: 129 mg/dL — ABNORMAL HIGH (ref 70–99)
Glucose-Capillary: 129 mg/dL — ABNORMAL HIGH (ref 70–99)
Glucose-Capillary: 174 mg/dL — ABNORMAL HIGH (ref 70–99)

## 2024-05-13 LAB — CBC
HCT: 50.8 % (ref 39.0–52.0)
Hemoglobin: 17.4 g/dL — ABNORMAL HIGH (ref 13.0–17.0)
MCH: 31.7 pg (ref 26.0–34.0)
MCHC: 34.3 g/dL (ref 30.0–36.0)
MCV: 92.5 fL (ref 80.0–100.0)
Platelets: 347 K/uL (ref 150–400)
RBC: 5.49 MIL/uL (ref 4.22–5.81)
RDW: 12.8 % (ref 11.5–15.5)
WBC: 15.5 K/uL — ABNORMAL HIGH (ref 4.0–10.5)
nRBC: 0 % (ref 0.0–0.2)

## 2024-05-13 LAB — BASIC METABOLIC PANEL WITH GFR
Anion gap: 13 (ref 5–15)
BUN: 43 mg/dL — ABNORMAL HIGH (ref 8–23)
CO2: 21 mmol/L — ABNORMAL LOW (ref 22–32)
Calcium: 8.9 mg/dL (ref 8.9–10.3)
Chloride: 107 mmol/L (ref 98–111)
Creatinine, Ser: 1.16 mg/dL (ref 0.61–1.24)
GFR, Estimated: >60 mL/min (ref >60)
Glucose, Bld: 141 mg/dL — ABNORMAL HIGH (ref 70–99)
Potassium: 4.2 mmol/L (ref 3.5–5.1)
Sodium: 141 mmol/L (ref 135–145)

## 2024-05-13 MED ORDER — ALBUTEROL SULFATE (2.5 MG/3ML) 0.083% IN NEBU
INHALATION_SOLUTION | RESPIRATORY_TRACT | Status: AC
  Filled 2024-05-13: qty 3

## 2024-05-13 NOTE — Progress Notes (Signed)
 Spoke with pt's wife this AM regarding palliative care meeting. Wife would like to wait until Tuesday at 11:30 for the meeting, as she would like to have his living will and has to obtain it from a bank lockbox. Palliative NP, Kathlyne, notified.

## 2024-05-13 NOTE — Progress Notes (Cosign Needed)
 STROKE TEAM PROGRESS NOTE    SIGNIFICANT HOSPITAL EVENTS   9/30:    Presented after witnessed seizure by neighbor and EMS.              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   INTERIM HISTORY/SUBJECTIVE  On exam today, patient lying in bed, not interactive, nonverbal, not following commands.  Flaccid bilateral arms with only slight withdrawal to bilateral legs.  Palliative care on board.  Discussed patient's poor prognosis further with wife and daughter.  Introduced family to comfort based care and hospice services.  Plan is to continue current treatment through the weekend to allow time for potential clinical improvement.  Family is open to further discussion and consideration of transitioning to comfort care should patient's condition decline. Possible need for PEG next week dependant on further goals of care.    CBC    Component Value Date/Time   WBC 15.5 (H) 05/13/2024 0337   RBC 5.49 05/13/2024 0337   HGB 17.4 (H) 05/13/2024 0337   HCT 50.8 05/13/2024 0337   PLT 347 05/13/2024 0337   MCV 92.5 05/13/2024 0337   MCH 31.7 05/13/2024 0337   MCHC 34.3 05/13/2024 0337   RDW 12.8 05/13/2024 0337   LYMPHSABS 1.0 05/02/2024 1851   MONOABS 0.5 05/02/2024 1851   EOSABS 0.0 05/02/2024 1851   BASOSABS 0.0 05/02/2024 1851    BMET    Component Value Date/Time   NA 141 05/13/2024 0337   NA 139 06/21/2020 1650   K 4.2 05/13/2024 0337   CL 107 05/13/2024 0337   CO2 21 (L) 05/13/2024 0337   GLUCOSE 141 (H) 05/13/2024 0337   BUN 43 (H) 05/13/2024 0337   BUN 13 06/21/2020 1650   CREATININE 1.16 05/13/2024 0337   CALCIUM  8.9 05/13/2024 0337   GFRNONAA >60 05/13/2024 0337    IMAGING past 24 hours No results found.   Vitals:   05/12/24 1727  05/12/24 2000 05/13/24 0006 05/13/24 0429  BP: 120/76 124/73 107/77 127/81  Pulse: 92 (!) 106 (!) 108 97  Resp: 20 20 20 18   Temp: 99.3 F (37.4 C) 98 F (36.7 C) 97.9 F (36.6 C) 97.8 F (36.6 C)  TempSrc: Axillary Oral Oral Oral  SpO2: 97% 94% 94% 98%  Weight:      Height:         PHYSICAL EXAM General:  critically ill  elderly male  Psych:  Mood and affect appropriate for situation CV: Regular rate and rhythm on monitor Respiratory:  Regular, unlabored respirations GI: Abdomen soft and nontender   NEURO:  Mental Status:  Awake, eyes open.  Patient does not follow commands.  No verbal output.  Cranial Nerves:  II: PERRL, left 3 mm, right 2 mm.. Blinks bilaterally.  III, IV, VI: Left eye with left gaze preference.  Right and midline.  Consistent with 1-1/2 syndrome.  Does not track examiner. V, VII: ETT in place.  Bilateral facial droop.  Corneal reflex intact, greater on right IX, X: Cough and gag reflexes intact.  Uncooperative with tongue protrusion. Motor/sensory:  BUE: Flaccid BLE: Slight withdrawal to pain only. Coordination: Unable to assess Gait- deferred   Most Recent NIH 56   ASSESSMENT/PLAN Mr. Renell Allum is a 73 y.o.  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 Now s/p TICI 2B. Admitted for ICU monitoring and stroke workup. NIH on Admission: 28.   Stroke: posterior circulation infarcts with BA occlusion s/p IR with TICI 2B, etiology likely Afib not on AC  CT head No acute abnormality.  Possible remote infarct right corona radiata, extending into basal ganglia.  Small remote infarct right cerebellum. CTA head & neck with perfusion Positive CTA for acute basilar thrombosis.  Right SCA possibly occluded as it is not visualized.  Occlusion of left PCA at proximal left P2 segment.  Occlusion of r hide ight  vertebral artery at origin, remains occluded at vertebral basilar junction. Post IR CT head No evidence of acute infarct or intracranial hemorrhage MRI: Acute right pontine infarcts, left thalamic and bilateral cerebellar infarcts. Right side pontine hemorrhage MRA  Right vertebral artery occlusion, Proximal occlusion left PCA Repeat CT head Multiple recent infarcts of the cerebellum, left thalamus, and right pons,  2D Echo EF 40-45% LDL 124 HgbA1c 4.5 UDS + THC  VTE prophylaxis - continue Eliquis   aspirin  325 mg daily Eliquis  (noncompliant) prior to admission, continue Eliquis   Therapy recommendations: SNF Disposition:  pending, palliative care on board, wife will bring pt living will.    Acute respiratory failure Possible ASp Pneumonia  Decreased ability to protect airway secondary to multiple acute infarcts Completed Rocephin, Unasyn  Extubated 10/6 with no reintubation  Lots of copious secretions  10/11 O2 sat down, on non-rebreather and then nasal cannular.    Seizures vs. Posturing  Likely posturing secondary to brainstem stroke, low concern for seizure. Status post Keppra 4.25 g load 9/30 Continue Keppra maintenance dose of 1 g twice daily LTM EEG negative for seizures  Paroxsymal Atrial fibrillation Noted in multiple prior notes is not taking eliquis  due to patient choice. Hx of LV thrombus in 2015 Outpatient cardiology appointment 2021 notes that patient stopped taking his Eliquis  approximately a year ago and is only taking aspirin . 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 Started on Eliquis  at this time as well as aspirin  and Brilinta . Outpatient cardiology appointment 2021 notes that patient stopped taking his Eliquis  approximately a year ago and is only taking aspirin .   Hypertension CHF reduced EF Home meds:  None Phenylephrine off  Blood pressure goal <160   Hyperlipidemia Home meds: None LDL 124, goal < 70 Add Lipitor  40 mg  Continue statin at discharge  Dysphagia Patient has post-stroke dysphagia, SLP consulted NPO Continue tube feedings  Other Stroke Risk Factors Advanced age greater than/equal to 78 CAD/MI with hx of LV thrombus Obstructive sleep apnea, not on CPAP at home Cardiomyopathy with EF 40-45%   Other Active Problems Leukocytosis 12.2--9.4--12.3--10.8-11.9-11.1-15.5, Afeb  Hospital day # 11   Pt seen by Neuro NP/APP and later by MD. Note/plan to be edited by MD as needed.    Rocky JAYSON Likes, DNP [facility] Please use AMION for contact information & EPIC for messaging.  ATTENDING NOTE: I reviewed above note and agree with the assessment and plan. Pt was seen and examined.   No family at the bedside. Pt lying in bed, eyes open but not interactive. Pt awake with eyes open, nonverbal and not following commands.  R eye midline limited movement, L eye mid leftward gaze, however, able to have abduction with nystagmus, consistent with one and half syndrome. Not tracking, but blinking to visual field bilaterally. Left pupil 3mm and R pupil 2mm, both reactive to light. Seems to have bilateral facial droop. Tongue protrusion not cooperative. Bilateral UEs flaccid. Bilaterally LEs slight withdraw to pain. Sensation, coordination and gait not tested.  For detailed assessment and plan, please refer to above as I have made changes wherever appropriate.   Ary Cummins, MD PhD Stroke Neurology 05/14/2024 12:03 AM     To contact Stroke Continuity provider, please refer to [email or web address]. After hours, contact General Neurology

## 2024-05-13 NOTE — Progress Notes (Addendum)
 Pt Hr in 130-140's. O2 at 86/87% on 15L highflow. RT called and Neuro NP, Erin, notified. Transitioning to nonrebreather, as it seems to correlate with his O2 dropping. Nonrebreather placed and pt only comes up to 90%. PT not in respiratory distress, but will continue to monitor.

## 2024-05-13 NOTE — Plan of Care (Incomplete)
 No family at the bedside. Pt lying in bed, eyes open but not interactive.   Neuro - awake with eyes open, nonverbal and not following commands. R eye midline limited movement, L eye mid leftward gaze, however, able to have abduction with nystagmus, consistent with one and half syndrome. Not tracking, but blinking to visual field bilaterally. Left pupil 3mm and R pupil 2mm, both reactive to light. Seems to have bilateral facial droop. Tongue protrusion not cooperative. Bilateral UEs flaccid. Bilaterally LEs slight withdraw to pain. Sensation, coordination and gait not tested.

## 2024-05-13 NOTE — Plan of Care (Signed)
  Problem: Education: Goal: Knowledge of disease or condition will improve Outcome: Progressing Goal: Knowledge of secondary prevention will improve (MUST DOCUMENT ALL) Outcome: Progressing Goal: Knowledge of patient specific risk factors will improve (DELETE if not current risk factor) Outcome: Progressing   Problem: Ischemic Stroke/TIA Tissue Perfusion: Goal: Complications of ischemic stroke/TIA will be minimized Outcome: Progressing   Problem: Coping: Goal: Will verbalize positive feelings about self Outcome: Progressing Goal: Will identify appropriate support needs Outcome: Progressing   Problem: Health Behavior/Discharge Planning: Goal: Ability to manage health-related needs will improve Outcome: Progressing Goal: Goals will be collaboratively established with patient/family Outcome: Progressing   Problem: Self-Care: Goal: Ability to participate in self-care as condition permits will improve Outcome: Progressing Goal: Verbalization of feelings and concerns over difficulty with self-care will improve Outcome: Progressing Goal: Ability to communicate needs accurately will improve Outcome: Progressing   Problem: Nutrition: Goal: Risk of aspiration will decrease Outcome: Progressing Goal: Dietary intake will improve Outcome: Progressing   Problem: Education: Goal: Knowledge of General Education information will improve Description: Including pain rating scale, medication(s)/side effects and non-pharmacologic comfort measures Outcome: Progressing   Problem: Health Behavior/Discharge Planning: Goal: Ability to manage health-related needs will improve Outcome: Progressing   Problem: Clinical Measurements: Goal: Ability to maintain clinical measurements within normal limits will improve Outcome: Progressing Goal: Will remain free from infection Outcome: Progressing Goal: Diagnostic test results will improve Outcome: Progressing Goal: Respiratory complications will  improve Outcome: Progressing Goal: Cardiovascular complication will be avoided Outcome: Progressing   Problem: Activity: Goal: Risk for activity intolerance will decrease Outcome: Progressing   Problem: Nutrition: Goal: Adequate nutrition will be maintained Outcome: Progressing   Problem: Coping: Goal: Level of anxiety will decrease Outcome: Progressing   Problem: Elimination: Goal: Will not experience complications related to bowel motility Outcome: Progressing Goal: Will not experience complications related to urinary retention Outcome: Progressing   Problem: Pain Managment: Goal: General experience of comfort will improve and/or be controlled Outcome: Progressing   Problem: Safety: Goal: Ability to remain free from injury will improve Outcome: Progressing   Problem: Skin Integrity: Goal: Risk for impaired skin integrity will decrease Outcome: Progressing   Problem: Education: Goal: Understanding of CV disease, CV risk reduction, and recovery process will improve Outcome: Progressing Goal: Individualized Educational Video(s) Outcome: Progressing   Problem: Activity: Goal: Ability to return to baseline activity level will improve Outcome: Progressing   Problem: Cardiovascular: Goal: Ability to achieve and maintain adequate cardiovascular perfusion will improve Outcome: Progressing Goal: Vascular access site(s) Level 0-1 will be maintained Outcome: Progressing   Problem: Health Behavior/Discharge Planning: Goal: Ability to safely manage health-related needs after discharge will improve Outcome: Progressing   Problem: Activity: Goal: Ability to tolerate increased activity will improve Outcome: Progressing   Problem: Role Relationship: Goal: Method of communication will improve Outcome: Progressing

## 2024-05-13 NOTE — Progress Notes (Signed)
 Palliative Medicine Inpatient Follow Up Note   HPI: 73 y.o. male  with past medical history of afib (not taking eliquis  per notes) CAD, sleep apnea, and HTN  admitted on 05/02/2024 with seizure. Per note, he was found in the back yard by bystander, vomiting profusely and begun seizing. No prior history of seizure. Patient unable to protect his airway, therefore was intubated and sedated. Imaging supportive of basilar occlusion. MRI reveals multiple infarcts with pontine hemmorhage. He underwent basilar thrombectomy on 05/02/2024.    Extubated 05/08/2024.    Now presents with severe oropharygeal dysphagia with decreased secretion management making him at very severe risk for aspiration per SLP report. He is currently NPO and on continuous artificial feeding.     PMT has been consulted to assist with goals of care conversation. Patient/Family face treatment option decisions, advanced directive decisions and anticipatory care needs.    Family face treatment option decision, advance directive decisions and anticipatory care needs.    Today's Discussion 05/13/2024  *Please note that this is a verbal dictation therefore any spelling or grammatical errors are due to the Dragon Medical One system interpretation.  Chart reviewed inclusive of vital signs, progress notes, laboratory results, and diagnostic images.   Created space and opportunity for wife to explore thoughts feelings and fears regarding current medical situation.  Visited patient at bedside today, hs is chronically-ill appearing, lethargic but responsive to verbal and tactile stimuli. Artificial feeding in progress through cortrak right nare. Noted patient to have vomited moderate amount of what appears to be gastric contents. Suction and oral care provided. No family member present at bedside.   Today, while in the patient's room, I contacted the patient's wife, Heron, via telephone to discuss the current treatment plan and goals  of care. Earlier in the day, the RN informed me that Heron would not be able to attend the scheduled family meeting at 61 AM. Heron explained that she needed to visit the bank to retrieve the patient's living will, which is stored in a secure lockbox. She also expressed that she is not yet ready to engage in further discussions regarding goals of care.  Heron appeared somewhat hesitant to discuss future plans for her husband, citing previous negative experiences with the medical team. She emphasized that she does not want to feel pressured into making decisions and remains hopeful, noting that her husband has shown signs of improvement. She recalled being told that he might never be weaned off the ventilator, yet he was eventually able to do so and is "still alive." Her hope is that with time, further clinical improvement may occur.  She is aware of the patient's current condition, including significant difficulty with swallowing and a high risk for aspiration. She understands that the SLP is actively evaluating his swallowing function.  I discussed with her the patient's high risk for decompensation and explained that any potential improvement would likely be a prolonged and challenging process due to deficits from the recent stroke. We explored the balance between quality of life and life-prolonging measures, but she did not provide a clear direction at this time.  Heron expressed hope that her husband will eventually regain the ability to swallow safely. I reviewed the potential options should oral intake remain unsafe, including PEG or G-tube placement, or comfort feeding with an understanding of the aspiration risks. I also discussed the risks associated with long-term artificial nutrition. Heron stated that she would not want her husband to be on a feeding tube  long-term.  We discussed the possibility of transitioning to comfort-focused care, including hospice services, should current  interventions fail to improve the patient's condition. I provided detailed information about hospice care, both inpatient and outpatient, and addressed all of Barbara's questions. She indicated that home hospice would not be feasible and is more inclined toward SNF placement with hospice involvement or inpatient hospice care, such as [facility]. I explained that they have eligibility criteria for inpatient hospice and offered to have a hospice liaison evaluate the patient if needed.   I informed Heron that a chest X-ray was performed today and that we are awaiting the results. I expressed concern about the patient's high disease burden and possibility of decompensation and emphasized the importance of timely decision-making regarding the treatment pathway. All of Barbara's questions and concerns were addressed during the conversation.  I spoke with the patient's daughter, Therisa, via telephone to provide an update on the patient's current medical condition. The information shared was consistent with what was previously discussed with the patient's wife. At this time, the family's plan remains unchanged. They have chosen to continue the current medical treatment through the weekend to allow time for potential clinical improvement. However, Therisa expressed that the family is open and considerate of transitioning to comfort-focused care should the patient's condition decline.  Patient and her family face treatment option decisions, advanced directive decisions and anticipatory care needs.   Questions and concerns addressed   Palliative Support Provided.   Objective Assessment: Vital Signs Vitals:   05/13/24 0818 05/13/24 0818  BP: 139/81 139/81  Pulse: (!) 118 (!) 118  Resp:  20  Temp: 98.3 F (36.8 C) 98.3 F (36.8 C)  SpO2:  97%    Intake/Output Summary (Last 24 hours) at 05/13/2024 1131 Last data filed at 05/13/2024 0817 Gross per 24 hour  Intake 181.5 ml  Output 1300 ml  Net -1118.5  ml   Last Weight  Most recent update: 05/10/2024  6:13 AM    Weight  74.2 kg (163 lb 9.3 oz)              Physical Exam Vitals and nursing note reviewed.  Constitutional:      Appearance: He is ill-appearing.  Cardiovascular:     Rate and Rhythm: Tachycardia present.  GI: Mildly distended. Cortrak to right nare intact, artificial feeding in progress. Pulmonary:     Effort: Pulmonary effort is normal.  Musculoskeletal:     Comments: Generalized weakness   Skin:    General: Skin is warm and dry.  Neurological:     Comments: Lethargy, orientation UTA     SUMMARY OF RECOMMENDATIONS    Code Status: Maintain DNR- Limited Continue current scope of care Goal of care is medical stabilization and recovery to the extent this is possible Maintain NPO, SLP evaluation in progress.  In-person family meeting to further discuss GOC is scheduled for Tuesday 05/16/2024 at 11:30am Encouraged ongoing goals of care discussions between the family and medical team as the patient's condition evolves. Family remains open to comfort-focused care if clinical decline occurs. Hospice services were reviewed with both the wife and daughter, Therisa. Continue to provide psycho-social and emotional support to patient and family Palliative medicine team will continue to follow.    Time Spent: 50 minutes  Detailed review of medical records (labs, imaging, vital signs), medically appropriate exam, discussed with treatment team, counseling and education to patient, family, & staff, documenting clinical information, coordination of care.   ______________________________________________________________________________ Kenyon Eshleman  NP-C [place] Palliative Medicine Team Team Cell Phone: [phone] Please utilize secure chat with additional questions, if there is no response within 30 minutes please call the above phone number  Palliative Medicine Team providers are available by phone from 7am to 7pm  daily and can be reached through the team cell phone.  Should this patient require assistance outside of these hours, please call the patient's attending physician.

## 2024-05-14 LAB — GLUCOSE, CAPILLARY
Glucose-Capillary: 121 mg/dL — ABNORMAL HIGH (ref 70–99)
Glucose-Capillary: 134 mg/dL — ABNORMAL HIGH (ref 70–99)
Glucose-Capillary: 144 mg/dL — ABNORMAL HIGH (ref 70–99)

## 2024-05-14 LAB — CULTURE, RESPIRATORY W GRAM STAIN

## 2024-05-14 LAB — BASIC METABOLIC PANEL WITH GFR
Anion gap: 15 (ref 5–15)
BUN: 63 mg/dL — ABNORMAL HIGH (ref 8–23)
CO2: 22 mmol/L (ref 22–32)
Calcium: 8.8 mg/dL — ABNORMAL LOW (ref 8.9–10.3)
Chloride: 109 mmol/L (ref 98–111)
Creatinine, Ser: 1.64 mg/dL — ABNORMAL HIGH (ref 0.61–1.24)
GFR, Estimated: 45 mL/min — ABNORMAL LOW (ref >60)
Glucose, Bld: 129 mg/dL — ABNORMAL HIGH (ref 70–99)
Potassium: 4.4 mmol/L (ref 3.5–5.1)
Sodium: 146 mmol/L — ABNORMAL HIGH (ref 135–145)

## 2024-05-14 MED ORDER — MORPHINE BOLUS VIA INFUSION
5.0000 mg | INTRAVENOUS | Status: DC | PRN
  Administered 2024-05-14 (×2): 5 mg via INTRAVENOUS

## 2024-05-14 MED ORDER — MORPHINE 100MG IN NS 100ML (1MG/ML) PREMIX INFUSION
0.0000 mg/h | INTRAVENOUS | Status: DC
  Administered 2024-05-14: 5 mg/h via INTRAVENOUS
  Administered 2024-05-14 – 2024-05-15 (×3): 10 mg/h via INTRAVENOUS
  Filled 2024-05-14 (×3): qty 100

## 2024-05-14 MED ORDER — LORAZEPAM 2 MG/ML IJ SOLN: 2.0000 mg | INTRAMUSCULAR | Status: DC | PRN

## 2024-05-14 MED ORDER — MORPHINE SULFATE (PF) 2 MG/ML IV SOLN
2.0000 mg | Freq: Once | INTRAVENOUS | Status: AC
  Administered 2024-05-14: 2 mg via INTRAVENOUS
  Filled 2024-05-14: qty 1

## 2024-05-14 MED ORDER — GLYCOPYRROLATE 1 MG PO TABS: 1.0000 mg | ORAL_TABLET | ORAL | Status: DC | PRN

## 2024-05-14 MED ORDER — SODIUM CHLORIDE 0.9 % IV SOLN: INTRAVENOUS | Status: DC

## 2024-05-14 MED ORDER — GLYCOPYRROLATE 0.2 MG/ML IJ SOLN: 0.2000 mg | INTRAMUSCULAR | Status: DC | PRN

## 2024-05-14 NOTE — Progress Notes (Signed)
 Was placed on morphine  drip for comfort care at 5mg /hr and that brought him resp down from 38 breaths per min to 28 but after several minutes resp started to go back up so was given a bolus and drip increased to 10mg /hr and that has helped to decrease his resp.  Wife and daughter at side along with chaplain.

## 2024-05-14 NOTE — Progress Notes (Signed)
 Chaplain responds to page of family requesting visit as pt has been transitioned to comfort care. Pt is accompanied by wife Heron, daughter Therisa, and Anna's fiance, also named [employer]. Chaplain provides end-of-life education and support as well as a blessing appropriate to pt's earth-based spirituality. Chaplain encourages sharing stories and life review, as well as sharing feelings with pt and offering their own blessings. Chaplain supports family as they try to decide whether to stay or go home for awhile.

## 2024-05-14 NOTE — Plan of Care (Signed)
 Problem: Education: Goal: Knowledge of disease or condition will improve 05/14/2024 0458 by Pearly Doffing, RN Outcome: Progressing 05/13/2024 2316 by Pearly Doffing, RN Outcome: Progressing Goal: Knowledge of secondary prevention will improve (MUST DOCUMENT ALL) 05/14/2024 0458 by Pearly Doffing, RN Outcome: Progressing 05/13/2024 2316 by Pearly Doffing, RN Outcome: Progressing Goal: Knowledge of patient specific risk factors will improve (DELETE if not current risk factor) 05/14/2024 0458 by Pearly Doffing, RN Outcome: Progressing 05/13/2024 2316 by Pearly Doffing, RN Outcome: Progressing   Problem: Ischemic Stroke/TIA Tissue Perfusion: Goal: Complications of ischemic stroke/TIA will be minimized 05/14/2024 0458 by Pearly Doffing, RN Outcome: Progressing 05/13/2024 2316 by Pearly Doffing, RN Outcome: Progressing   Problem: Coping: Goal: Will verbalize positive feelings about self 05/14/2024 0458 by Pearly Doffing, RN Outcome: Progressing 05/13/2024 2316 by Pearly Doffing, RN Outcome: Progressing Goal: Will identify appropriate support needs 05/14/2024 0458 by Pearly Doffing, RN Outcome: Progressing 05/13/2024 2316 by Pearly Doffing, RN Outcome: Progressing   Problem: Health Behavior/Discharge Planning: Goal: Ability to manage health-related needs will improve 05/14/2024 0458 by Pearly Doffing, RN Outcome: Progressing 05/13/2024 2316 by Pearly Doffing, RN Outcome: Progressing Goal: Goals will be collaboratively established with patient/family 05/14/2024 0458 by Pearly Doffing, RN Outcome: Progressing 05/13/2024 2316 by Pearly Doffing, RN Outcome: Progressing   Problem: Self-Care: Goal: Ability to participate in self-care as condition permits will improve 05/14/2024 0458 by Pearly Doffing, RN Outcome: Progressing 05/13/2024 2316 by Pearly Doffing, RN Outcome: Progressing Goal: Verbalization of feelings and concerns over difficulty with self-care will improve 05/14/2024 0458 by  Pearly Doffing, RN Outcome: Progressing 05/13/2024 2316 by Pearly Doffing, RN Outcome: Progressing Goal: Ability to communicate needs accurately will improve 05/14/2024 0458 by Pearly Doffing, RN Outcome: Progressing 05/13/2024 2316 by Pearly Doffing, RN Outcome: Progressing   Problem: Nutrition: Goal: Risk of aspiration will decrease 05/14/2024 0458 by Pearly Doffing, RN Outcome: Progressing 05/13/2024 2316 by Pearly Doffing, RN Outcome: Progressing Goal: Dietary intake will improve 05/14/2024 0458 by Pearly Doffing, RN Outcome: Progressing 05/13/2024 2316 by Pearly Doffing, RN Outcome: Progressing   Problem: Education: Goal: Knowledge of General Education information will improve Description: Including pain rating scale, medication(s)/side effects and non-pharmacologic comfort measures 05/14/2024 0458 by Pearly Doffing, RN Outcome: Progressing 05/13/2024 2316 by Pearly Doffing, RN Outcome: Progressing   Problem: Health Behavior/Discharge Planning: Goal: Ability to manage health-related needs will improve 05/14/2024 0458 by Pearly Doffing, RN Outcome: Progressing 05/13/2024 2316 by Pearly Doffing, RN Outcome: Progressing   Problem: Clinical Measurements: Goal: Ability to maintain clinical measurements within normal limits will improve 05/14/2024 0458 by Pearly Doffing, RN Outcome: Progressing 05/13/2024 2316 by Pearly Doffing, RN Outcome: Progressing Goal: Will remain free from infection 05/14/2024 0458 by Pearly Doffing, RN Outcome: Progressing 05/13/2024 2316 by Pearly Doffing, RN Outcome: Progressing Goal: Diagnostic test results will improve 05/14/2024 0458 by Pearly Doffing, RN Outcome: Progressing 05/13/2024 2316 by Pearly Doffing, RN Outcome: Progressing Goal: Respiratory complications will improve 05/14/2024 0458 by Pearly Doffing, RN Outcome: Progressing 05/13/2024 2316 by Pearly Doffing, RN Outcome: Progressing Goal: Cardiovascular complication will be  avoided 05/14/2024 0458 by Pearly Doffing, RN Outcome: Progressing 05/13/2024 2316 by Pearly Doffing, RN Outcome: Progressing   Problem: Activity: Goal: Risk for activity intolerance will decrease 05/14/2024 0458 by Pearly Doffing, RN Outcome: Progressing 05/13/2024 2316 by Pearly Doffing, RN Outcome: Progressing   Problem: Nutrition: Goal: Adequate nutrition will be maintained 05/14/2024 0458 by Pearly Doffing, RN Outcome: Progressing 05/13/2024 2316 by Pearly Doffing, RN Outcome: Progressing   Problem: Coping: Goal: Level of anxiety will decrease  05/14/2024 0458 by Pearly Doffing, RN Outcome: Progressing 05/13/2024 2316 by Pearly Doffing, RN Outcome: Progressing   Problem: Elimination: Goal: Will not experience complications related to bowel motility 05/14/2024 0458 by Pearly Doffing, RN Outcome: Progressing 05/13/2024 2316 by Pearly Doffing, RN Outcome: Progressing Goal: Will not experience complications related to urinary retention 05/14/2024 0458 by Pearly Doffing, RN Outcome: Progressing 05/13/2024 2316 by Pearly Doffing, RN Outcome: Progressing   Problem: Pain Managment: Goal: General experience of comfort will improve and/or be controlled 05/14/2024 0458 by Pearly Doffing, RN Outcome: Progressing 05/13/2024 2316 by Pearly Doffing, RN Outcome: Progressing   Problem: Safety: Goal: Ability to remain free from injury will improve 05/14/2024 0458 by Pearly Doffing, RN Outcome: Progressing 05/13/2024 2316 by Pearly Doffing, RN Outcome: Progressing   Problem: Education: Goal: Understanding of CV disease, CV risk reduction, and recovery process will improve 05/14/2024 0458 by Pearly Doffing, RN Outcome: Progressing 05/13/2024 2316 by Pearly Doffing, RN Outcome: Progressing Goal: Individualized Educational Video(s) 05/14/2024 0458 by Pearly Doffing, RN Outcome: Progressing 05/13/2024 2316 by Pearly Doffing, RN Outcome: Progressing   Problem: Activity: Goal: Ability to  return to baseline activity level will improve 05/14/2024 0458 by Pearly Doffing, RN Outcome: Progressing 05/13/2024 2316 by Pearly Doffing, RN Outcome: Progressing   Problem: Health Behavior/Discharge Planning: Goal: Ability to safely manage health-related needs after discharge will improve 05/14/2024 0458 by Pearly Doffing, RN Outcome: Progressing 05/13/2024 2316 by Pearly Doffing, RN Outcome: Progressing   Problem: Activity: Goal: Ability to tolerate increased activity will improve 05/14/2024 0458 by Pearly Doffing, RN Outcome: Progressing 05/13/2024 2316 by Pearly Doffing, RN Outcome: Progressing   Problem: Role Relationship: Goal: Method of communication will improve 05/14/2024 0458 by Pearly Doffing, RN Outcome: Progressing 05/13/2024 2316 by Pearly Doffing, RN Outcome: Progressing

## 2024-05-14 NOTE — Progress Notes (Addendum)
 STROKE TEAM PROGRESS NOTE    SIGNIFICANT HOSPITAL EVENTS   9/30:    Presented after witnessed seizure by neighbor and EMS.              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   INTERIM HISTORY/SUBJECTIVE  No acute neurological events overnight.  Exam stable, not interactive not following commands only slight withdrawal movement bilateral legs. Patient with A-fib on monitor, rate controlled varying from 100-130s.  ADDENDUM: Extensive bedside discussion held with wife Heron.  Patient's assessment and poor prognosis reviewed at length.  Wife has made the decision to transition to comfort care.  Orders have been placed.  Appreciate palliative care following.  CBC    Component Value Date/Time   WBC 15.5 (H) 05/13/2024 0337   RBC 5.49 05/13/2024 0337   HGB 17.4 (H) 05/13/2024 0337   HCT 50.8 05/13/2024 0337   PLT 347 05/13/2024 0337   MCV 92.5 05/13/2024 0337   MCH 31.7 05/13/2024 0337   MCHC 34.3 05/13/2024 0337   RDW 12.8 05/13/2024 0337   LYMPHSABS 1.0 05/02/2024 1851   MONOABS 0.5 05/02/2024 1851   EOSABS 0.0 05/02/2024 1851   BASOSABS 0.0 05/02/2024 1851    BMET    Component Value Date/Time   NA 146 (H) 05/14/2024 0549   NA 139 06/21/2020 1650   K 4.4 05/14/2024 0549   CL 109 05/14/2024 0549   CO2 22 05/14/2024 0549   GLUCOSE 129 (H) 05/14/2024 0549   BUN 63 (H) 05/14/2024 0549   BUN 13 06/21/2020 1650   CREATININE 1.64 (H) 05/14/2024 0549   CALCIUM  8.8 (L) 05/14/2024 0549   GFRNONAA 45 (L) 05/14/2024 0549    IMAGING past 24 hours No results found.   Vitals:   05/14/24 0645 05/14/24 0744 05/14/24 0800 05/14/24 1125  BP:  114/76  93/60  Pulse:  (!) 106 (!) 102 (!) 110  Resp: (!) 22 (!) 24 (!) 22 (!) 23  Temp:  98.4 F  (36.9 C)  98.7 F (37.1 C)  TempSrc:  Oral  Oral  SpO2:  97%  94%  Weight:      Height:         PHYSICAL EXAM General:  critically ill  elderly male  CV: Irregular irregular Respiratory: Labored, tachypneic, Cheyne-Stokes respirations   NEURO:  Mental Status:  Awake, eyes barely open.  Patient does not follow commands.  No verbal output.  Cranial Nerves:  II: PERRL Blinks bilaterally.  III, IV, VI: Left eye with left gaze preference. Consistent with 1-1/2 syndrome.  Does not track examiner. V, VII: ETT in place.  Bilateral facial droop.  Corneal reflex bilaterally weak IX, X: Cough and gag reflexes very weak.  Uncooperative with tongue protrusion. Motor/sensory:  BUE: Flaccid BLE: Slight withdrawal to pain only, decreased even further from yesterday. Coordination: Unable to assess Gait- deferred   Most Recent NIH 53   ASSESSMENT/PLAN Mr. Holt Woolbright is a 73 y.o. 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 Now s/p TICI 2B. Admitted for ICU monitoring and stroke workup. NIH on Admission: 28.   Stroke: posterior circulation  infarcts with BA occlusion s/p IR with TICI 2B, etiology likely Afib not on AC  CT head No acute abnormality.  Possible remote infarct right corona radiata, extending into basal ganglia.  Small remote infarct right cerebellum. CTA head & neck with perfusion Positive CTA for acute basilar thrombosis.  Right SCA possibly occluded as it is not visualized.  Occlusion of left PCA at proximal left P2 segment.  Occlusion of r hide ight vertebral artery at origin, remains occluded at vertebral basilar junction. Post IR CT head No evidence of acute infarct or intracranial hemorrhage MRI: Acute right pontine infarcts, left thalamic and bilateral cerebellar infarcts. Right side pontine hemorrhage MRA  Right vertebral  artery occlusion, Proximal occlusion left PCA Repeat CT head Multiple recent infarcts of the cerebellum, left thalamus, and right pons,  2D Echo EF 40-45% LDL 124 HgbA1c 4.5 UDS + THC  VTE prophylaxis - continue Eliquis   aspirin  325 mg daily Eliquis  (noncompliant) prior to admission, continue Eliquis   Therapy recommendations: SNF Disposition:  pending, palliative care on board, wife will bring pt living will.    Acute respiratory failure Possible ASp Pneumonia  Decreased ability to protect airway secondary to multiple acute infarcts Completed Rocephin, Unasyn  Extubated 10/6 with no reintubation  Lots of copious secretions  10/11 O2 sat down, on non-rebreather and then nasal cannula.     Seizures vs. Posturing  Likely posturing secondary to brainstem stroke, low concern for seizure. Status post Keppra 4.25 g load 9/30 Continue Keppra maintenance dose of 1 g twice daily LTM EEG negative for seizures  Paroxsymal Atrial fibrillation Noted in multiple prior notes is not taking eliquis  due to patient choice. Hx of LV thrombus in 2015 Outpatient cardiology appointment 2021 notes that patient stopped taking his Eliquis  approximately a year ago and is only taking aspirin . 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 Started on Eliquis  at this time as well as aspirin  and Brilinta . Outpatient cardiology appointment 2021 notes that patient stopped taking his Eliquis  approximately a year ago and is only taking aspirin .   Hypertension CHF reduced EF Home meds: None Phenylephrine off  Blood pressure goal <160   Hyperlipidemia Home meds: None LDL 124, goal < 70 Add Lipitor  40 mg  Continue statin at discharge  Dysphagia Patient has post-stroke dysphagia, SLP consulted NPO Continue tube feedings  Other Stroke Risk Factors Advanced  age greater than/equal to 1 CAD/MI with hx of LV thrombus Obstructive sleep apnea, not on CPAP at home Cardiomyopathy with EF 40-45%   Other Active Problems Leukocytosis 12.2--9.4--12.3--10.8-11.9-11.1-15.5, Afeb  Hospital day # 12   Pt seen by Neuro NP/APP and later by MD. Note/plan to be edited by MD as needed.    Rocky JAYSON Likes, DNP [facility] Please use AMION for contact information & EPIC for messaging.   ATTENDING NOTE: I reviewed above note and agree with the assessment and plan. Pt was seen and examined.   Pt lying in bed, on Kiron, O2 sat 94% with mild tachycardia. Pt eyes intermittently open but not tracking not following commands, no spontaneous movement of extremities. Has tachypnea with cheyne stokes breathing pattern, in mild respiratory distress. Later in the afternoon, wife arrived and our NP had GOC discussion with her and family requested comfort care measure given the poor prognosis. Will initiate comfort care orderset.   For detailed assessment and  plan, please refer to above as I have made changes wherever appropriate.   Ary Cummins, MD PhD Stroke Neurology 05/14/2024 8:49 PM    To contact Stroke Continuity provider, please refer to [email or web address]. After hours, contact General Neurology

## 2024-05-14 NOTE — Plan of Care (Signed)
 Wife has called to check on him and said she will try to get a way out here to see him.  Said family is still deciding if they want him to be changed to comfort care.     Problem: Self-Care: Goal: Ability to participate in self-care as condition permits will improve Outcome: Progressing   Problem: Self-Care: Goal: Ability to communicate needs accurately will improve Outcome: Progressing   Problem: Nutrition: Goal: Risk of aspiration will decrease Outcome: Progressing

## 2024-05-15 DIAGNOSIS — R1310 Dysphagia, unspecified: Secondary | ICD-10-CM

## 2024-05-15 DIAGNOSIS — J690 Pneumonitis due to inhalation of food and vomit: Secondary | ICD-10-CM | POA: Insufficient documentation

## 2024-05-15 DIAGNOSIS — I10 Essential (primary) hypertension: Secondary | ICD-10-CM | POA: Insufficient documentation

## 2024-05-15 DIAGNOSIS — D72829 Elevated white blood cell count, unspecified: Secondary | ICD-10-CM | POA: Insufficient documentation

## 2024-05-15 DIAGNOSIS — I509 Heart failure, unspecified: Secondary | ICD-10-CM

## 2024-05-15 MED ORDER — BIOTENE DRY MOUTH MT LIQD: 15.0000 mL | Freq: Two times a day (BID) | OROMUCOSAL | Status: DC

## 2024-05-15 MED ORDER — DIPHENHYDRAMINE HCL 50 MG/ML IJ SOLN: 25.0000 mg | INTRAMUSCULAR | Status: DC | PRN

## 2024-06-03 NOTE — Progress Notes (Signed)
 Nutrition Brief Note  Chart reviewed. Pt now transitioning to comfort care.  No further nutrition interventions planned at this time.  Please re-consult as needed.   Vernell Lukes, RD, LDN, CNSC Registered Dietitian II Please reach out via secure chat

## 2024-06-03 NOTE — Plan of Care (Signed)
 Comfort care

## 2024-06-03 NOTE — TOC Transition Note (Signed)
 Transition of Care [facility]) - Discharge Note   Patient Details  Name: Gregory Shields MRN: [id number] Date of Birth: [birth year]  [facility]) CM/SW Contact:  Andrez JULIANNA George, RN Phone Number: 06/03/2024, 2:31 PM   Clinical Narrative:     Pt is discharging to [facility]. He will transport via [facility], with a pick up time scheduled for 8 pm. Number for report: [phone].   D/c packet is at the desk.    Final next level of care: Hospice Medical Facility Barriers to Discharge: No Barriers Identified   Patient Goals and CMS Choice   CMS Medicare.gov Compare Post Acute Care list provided to:: Patient Represenative (must comment) Choice offered to / list presented to : Spouse      Discharge Placement                       Discharge Plan and Services Additional resources added to the After Visit Summary for                                       Social Drivers of Health (SDOH) Interventions SDOH Screenings   Food Insecurity: No Food Insecurity (11/20/2023)  Housing: Low Risk  (11/20/2023)  Transportation Needs: No Transportation Needs (11/20/2023)  Utilities: Not At Risk (11/20/2023)  Social Connections: Unknown (11/20/2023)  Tobacco Use: Low Risk  (11/18/2023)     Readmission Risk Interventions     No data to display

## 2024-06-03 NOTE — TOC Progression Note (Signed)
 Transition of Care [facility]) - Progression Note    Patient Details  Name: Gregory Shields MRN: [id number] Date of Birth: 1950/10/28  Transition of Care [facility]) CM/SW Contact  Andrez JULIANNA George, RN Phone Number: 06/03/2024, 10:40 AM  Clinical Narrative:     [facility] to speak to wife about [facility]. IP Care management following.  Expected Discharge Plan:  (hospital death)                 Expected Discharge Plan and Services                                               Social Drivers of Health (SDOH) Interventions SDOH Screenings   Food Insecurity: No Food Insecurity (11/20/2023)  Housing: Low Risk  (11/20/2023)  Transportation Needs: No Transportation Needs (11/20/2023)  Utilities: Not At Risk (11/20/2023)  Social Connections: Unknown (11/20/2023)  Tobacco Use: Low Risk  (11/18/2023)    Readmission Risk Interventions     No data to display

## 2024-06-03 NOTE — Death Summary Note (Addendum)
 DEATH SUMMARY   Patient Details  Name: Gregory Shields MRN: [id number] DOB: 1951-03-21  Admission/Discharge Information   Admit Date:  May 08, 2024  Date of Death: Date of Death: 05/21/24  Time of Death: Time of Death: 1700  Length of Stay: 2024-07-21  Referring Physician: Espinoza, Alejandra, DO   Reason(s) for Hospitalization  Stroke   Diagnoses  Preliminary cause of death:  Stroke: posterior circulation infarcts with BA occlusion s/p IR with TICI 2B, etiology likely Afib not on [facility]   Secondary Diagnoses (including complications and co-morbidities):  PAF Acute respiratory failure Aspiration pneumonia History of stroke Hypertension Cardiomyopathy CAD/MI with history of LV thrombus Hyperlipidemia Dysphagia   Brief Hospital Course (including significant findings, care, treatment, and services provided and events leading to death)  Gregory Shields is a 73 y.o. 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 Now s/p TICI 2B. Admitted for ICU monitoring and stroke workup. NIH on Admission: 28.   Stroke: posterior circulation infarcts with BA occlusion s/p IR with TICI 2B, etiology likely Afib not on AC  CT head No acute abnormality.  Possible remote infarct right corona radiata, extending into basal ganglia.  Small remote infarct right cerebellum. CTA head & neck with perfusion Positive CTA for acute basilar thrombosis.  Right SCA possibly occluded as it is not visualized.  Occlusion of left PCA at proximal left P2 segment.  Occlusion of r hide ight vertebral artery at origin, remains occluded at vertebral basilar junction. Post IR CT head No evidence of acute infarct or intracranial hemorrhage MRI: Acute right pontine infarcts, left thalamic and bilateral cerebellar infarcts. Right side pontine hemorrhage MRA  Right vertebral artery  occlusion, Proximal occlusion left PCA Repeat CT head Multiple recent infarcts of the cerebellum, left thalamus, and right pons,  2D Echo EF 40-45% LDL 124 HgbA1c 4.5 UDS + THC  aspirin  325 mg daily Eliquis  (noncompliant) prior to admission, was on Eliquis  Pt condition worsened with respiratory distress, palliative care on board, transitioned to comfort care measures after discuss with wife. Pt expired 1700 on 21-May-2024    Acute respiratory failure Possible ASp Pneumonia  Decreased ability to protect airway secondary to multiple acute infarcts Completed Rocephin , Unasyn   Extubated 10/6 with no reintubation  Lots of copious secretions  Leukocytosis 12.2--9.4--12.3--10.8-11.9-11.1-15.5, Afeb 10/11- respiratory distress->10/12 transition to comfort care  Seizures vs. Posturing  Likely posturing secondary to brainstem stroke, low concern for seizure. Status post Keppra  4.25 g load 05/08/24 Was on Keppra  maintenance dose of 1 g twice daily LTM EEG negative for seizures   Paroxsymal Atrial fibrillation Noted in multiple prior notes is not taking eliquis  due to patient choice. Hx of LV thrombus in 2015 Outpatient cardiology appointment 2021 notes that patient stopped taking his Eliquis  approximately a year ago and is only taking aspirin . ASA discontinued, IV heparin  transitioned to Eliquis     History of stroke/TIA 05/2014: Multiple right MCA strokes secondary to CAD with anterolateral wall MI requiring emergent PCI, found to have LV thrombus. Started on Eliquis  at this time as well as aspirin  and Brilinta . Outpatient cardiology appointment 2021 notes that patient stopped taking his Eliquis  approximately a year ago and is only taking aspirin .   Hypertension CHF reduced EF CAD/MI with hx of LV thrombus Home meds: None EF 40-45%   Hyperlipidemia Home meds: None LDL 124, goal < 70 Was on Lipitor  40  mg    Dysphagia Patient has post-stroke dysphagia, SLP consulted NPO, Cortrak- removed  for comfort care    Pertinent Labs and Studies  Significant Diagnostic Studies DG CHEST PORT 1 VIEW Result Date: 05/13/2024 EXAM: 1 VIEW(S) XRAY OF THE CHEST 05/13/2024 10:07:00 AM COMPARISON: 05/04/2024 CLINICAL HISTORY: stroke FINDINGS: LINES, TUBES AND DEVICES: Enteric tube in place with distal tip beyond the inferior margin of the film. LUNGS AND PLEURA: Low lung volumes. Left basilar opacity, possibly representing atelectasis and/or small pleural effusion. No pulmonary edema. No pneumothorax. HEART AND MEDIASTINUM: Aortic atherosclerosis. No acute abnormality of the cardiac and mediastinal silhouettes. BONES AND SOFT TISSUES: No acute osseous abnormality. IMPRESSION: 1. Left basilar opacity that may represent atelectasis and/or small pleural effusion. 2. Hypoinflated lungs. Electronically signed by: Waddell Calk MD 05/13/2024 01:07 PM EDT RP Workstation: [id number]   DG Abd Portable 1V Result Date: 05/10/2024 CLINICAL DATA:  Feeding tube placement. EXAM: PORTABLE ABDOMEN - 1 VIEW COMPARISON:  05/09/2024. FINDINGS: Feeding tube terminates in the gastric antrum. A second small bore tube terminates in the distal stomach. Bowel gas pattern is unremarkable. IMPRESSION: Two enteric tubes terminating in the gastric antrum and distal stomach, respectively. Are Electronically Signed   By: Newell Eke M.D.   On: 05/10/2024 13:06   DG Abd 1 View Result Date: 05/09/2024 EXAM: 1 VIEW XRAY OF THE ABDOMEN 05/09/2024 02:58:00 PM COMPARISON: None available. CLINICAL HISTORY: Nasogastric tube present. Reason for exam: Nasogastric tube present. FINDINGS: LINES, TUBES AND DEVICES: Feeding tube with tip in the mid stomach. Stylet remains within the feeding tube. BOWEL: Nonobstructive bowel gas pattern. SOFT TISSUES: No opaque urinary calculi. BONES: No acute osseous abnormality. IMPRESSION: 1. Feeding tube with tip positioned in the mid stomach. 2. Stylet remains within the feeding tube. Electronically signed by:  Norleen Boxer MD 05/09/2024 03:34 PM EDT RP Workstation: [id number]   CT HEAD WO CONTRAST ( ) Result Date: 05/05/2024 EXAM: CT HEAD WITHOUT CONTRAST 05/05/2024 12:55:33 AM TECHNIQUE: CT of the head was performed without the administration of intravenous contrast. Automated exposure control, iterative reconstruction, and/or weight based adjustment of the mA/kV was utilized to reduce the radiation dose to as low as reasonably achievable. COMPARISON: MRI dated 05/03/2024. CLINICAL HISTORY: Stroke, follow up. FINDINGS: BRAIN AND VENTRICLES: No acute hemorrhage. Multiple recent infarcts of the cerebellum, left thalamus and right pons has demonstrated on recent MRI. Chronic ischemic white matter changes. Generalized volume loss. No hydrocephalus. No extra-axial collection. No mass effect or midline shift. ORBITS: No acute abnormality. SINUSES: No acute abnormality. SOFT TISSUES AND SKULL: No acute soft tissue abnormality. No skull fracture. IMPRESSION: 1. Multiple recent infarcts of the cerebellum, left thalamus, and right pons, as demonstrated on recent MRI. 2. Chronic ischemic white matter changes. 3. Generalized volume loss. Electronically signed by: Franky Stanford MD 05/05/2024 01:51 AM EDT RP Workstation: [id number]   DG CHEST PORT 1 VIEW Result Date: 05/04/2024 CLINICAL DATA:  Oral secretions. EXAM: PORTABLE CHEST 1 VIEW COMPARISON:  05/03/2024. FINDINGS: Endotracheal tube tip is located 4.1 cm above the carina. Enteric tube tip in the stomach, to the right of midline, near the expected level of the gastric antrum. Low lung volumes with associated accentuation of the cardiac silhouette and bronchovascular crowding. No appreciable focal consolidation, pleural effusion, or pneumothorax. Visualized osseous structures are unchanged. IMPRESSION: 1. Endotracheal tube in appropriate position. 2. Low lung volumes with bronchovascular crowding. No appreciable acute cardiopulmonary findings. Electronically Signed   By:  Harrietta Sherry M.D.   On: 05/04/2024 10:52  Overnight EEG with video Result Date: 05/04/2024 Shelton Arlin KIDD, MD     05/05/2024  9:13 AM Patient Name: Gregory Shields MRN: [id number] Epilepsy Attending: Arlin KIDD Shelton Referring Physician/Provider: Sallyann Normie HERO, MD Duration: 05/03/2024 0730 to 05/04/2024 1128 Patient history: 73 y.o. 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 EEG to evaluate for seizure Level of alertness: comatose AEDs during EEG study: LEV, propofol  Technical aspects: This EEG study was done with scalp electrodes positioned according to the 10-20 International system of electrode placement. Electrical activity was reviewed with band pass filter of 1-70Hz , sensitivity of 7 uV/mm, display speed of 80mm/sec with a 60Hz  notched filter applied as appropriate. EEG data were recorded continuously and digitally stored.  Video monitoring was available and reviewed as appropriate. Description: EEG showed continuous generalized 3 to 6 Hz theta-delta slowing admixed with 15 to 18 Hz beta activity distributed symmetrically and diffusely.  Hyperventilation and photic stimulation were not performed.  Event button was pressed on 05/03/2024 at 2000 for posturing.  Concomitant EEG before, during and after the event did not show any EEG changes suggest seizure. EEG was disconnected between 05/03/2024 1214 to 1455 for MRI brain and between 05/03/2024 2347 to 05/04/2024 0745 due to technical difficulties ABNORMALITY - Continuous slow, generalized IMPRESSION: This study is suggestive of severe diffuse encephalopathy, nonspecific etiology but likely related to sedation. No seizures or epileptiform discharges were seen throughout the recording. Event button was pressed on 05/03/2024 at 2000 for posturing without concomitant EEG change.  This was most likely not an epileptic  event. Arlin KIDD Shelton   MR ANGIO HEAD WO CONTRAST Result Date: 05/03/2024 CLINICAL DATA:  Acute infarcts EXAM: MRA HEAD WITHOUT CONTRAST TECHNIQUE: Angiographic images of the Circle of Willis were acquired using MRA technique without intravenous contrast. COMPARISON:  None Available. FINDINGS: MRA intracranial: Right-side: The internal carotid arteries patent without significant stenosis. There is a large posterior communicating artery which is patent. The middle cerebral artery is patent without severe stenosis or proximal branch occlusion. The anterior cerebral artery is patent. Left side: The internal carotid arteries patent without significant stenosis. The middle cerebral arteries patent without significant stenosis or proximal occlusion. The anterior cerebral arteries patent. Posterior circulation: The left vertebral artery is patent. There is no flow seen in the right vertebral artery. There is no basilar stenosis. There is a proximal occlusion of the left posterior cerebral artery. The right is patent. 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 The left vertebral artery is patent. The basilar artery is patent without significant stenosis. There is a proximal occlusion of the left posterior cerebral artery. The right posterior cerebral artery is mainly supplied by the posterior communicating artery. No proximal occlusion in the anterior circulation. Electronically Signed   By: Nancyann Burns M.D.   On: 05/03/2024 14:39   MR Brain W and Wo Contrast Result Date: 05/03/2024 CLINICAL DATA:  New onset seizure EXAM: MRI HEAD WITHOUT AND WITH CONTRAST TECHNIQUE: Multiplanar, multiecho pulse sequences of the brain and surrounding structures were obtained without and with intravenous contrast. CONTRAST:  7.5mL GADAVIST  GADOBUTROL  1 MMOL/ML IV SOLN COMPARISON:  CT/CTA 05/02/2024 FINDINGS: MRI brain: There are acute infarcts involving the right-side of the pons, left side of the  thalamus, and bilateral cerebellar hemispheres. There is hemorrhage in the right pontine infarct. There are old lacunar infarcts  involving the right periventricular white matter. No abnormal enhancement. The ventricles are normal. No mass lesion. There are normal flow signals in the carotid arteries and basilar artery. No significant bone marrow signal abnormality. No significant abnormality in the paranasal sinuses or soft tissues. IMPRESSION: There are acute infarcts involving the right-side of the pons, left side of the thalamus and bilateral cerebellar hemispheres. There is hemorrhage in the right-side of pons Electronically Signed   By: Nancyann Burns M.D.   On: 05/03/2024 14:32   ECHOCARDIOGRAM COMPLETE Result Date: 05/03/2024    ECHOCARDIOGRAM REPORT   Patient Name:   Gregory Shields Date of Exam: 05/03/2024 Medical Rec #:  [id number]    Height:       68.0 in Accession #:    [id number]   Weight:       165.3 lb Date of Birth:  [birth year]   BSA:          1.885 m Patient Age:    72 years     BP:           121/74 mmHg Patient Gender: M            HR:           58 bpm. Exam Location:  Inpatient Procedure: 2D Echo and Intracardiac Opacification Agent (Both Spectral and Color            Flow Doppler were utilized during procedure). Indications:    Stroke  History:        Patient has prior history of Echocardiogram examinations. CAD;                 Arrythmias:Atrial Fibrillation.  Sonographer:    Charmaine Gaskins Referring Phys: [id number] NORMIE HERO BUI  Sonographer Comments: Technically challenging study due to limited acoustic windows, Technically difficult study due to poor echo windows and echo performed with patient supine and on artificial respirator. IMPRESSIONS  1. No left ventricular thrombus is seen (Definity  contrast was used). Left ventricular ejection fraction, by estimation, is 40 to 45%. The left ventricle has mildly decreased function. The left ventricle demonstrates regional wall motion abnormalities (see  scoring diagram/findings for description). Left ventricular diastolic function could not be evaluated.  2. Right ventricular systolic function is normal. The right ventricular size is normal.  3. Left atrial size was severely dilated.  4. Right atrial size was severely dilated.  5. The mitral valve is normal in structure. Trivial mitral valve regurgitation. No evidence of mitral stenosis.  6. The aortic valve is normal in structure. There is mild thickening of the aortic valve. Aortic valve regurgitation is not visualized. Aortic valve sclerosis is present, with no evidence of aortic valve stenosis. Comparison(s): No significant change from prior study. Prior images reviewed side by side. FINDINGS  Left Ventricle: No left ventricular thrombus is seen (Definity  contrast was used). Left ventricular ejection fraction, by estimation, is 40 to 45%. The left ventricle has mildly decreased function. The left ventricle demonstrates regional wall motion abnormalities. Definity  contrast agent was given IV to delineate the left ventricular endocardial borders. The left ventricular internal cavity size was normal in size. There is no left ventricular hypertrophy. Left ventricular diastolic function could not be evaluated due to atrial fibrillation. Left ventricular diastolic function could not be evaluated.  LV Wall Scoring: The mid anteroseptal segment, apical lateral segment, apical anterior segment, and apex are akinetic. Right Ventricle: The right ventricular size is normal. No increase in right ventricular wall thickness. Right ventricular systolic  function is normal. Left Atrium: Left atrial size was severely dilated. Right Atrium: Right atrial size was severely dilated. Pericardium: There is no evidence of pericardial effusion. Mitral Valve: The mitral valve is normal in structure. Trivial mitral valve regurgitation. No evidence of mitral valve stenosis. Tricuspid Valve: The tricuspid valve is normal in structure.  Tricuspid valve regurgitation is mild. Aortic Valve: The aortic valve is normal in structure. There is mild thickening of the aortic valve. Aortic valve regurgitation is not visualized. Aortic valve sclerosis is present, with no evidence of aortic valve stenosis. Pulmonic Valve: The pulmonic valve was grossly normal. Pulmonic valve regurgitation is not visualized. No evidence of pulmonic stenosis. Aorta: The aortic root and ascending aorta are structurally normal, with no evidence of dilitation. Venous: IVC assessment for right atrial pressure unable to be performed due to mechanical ventilation. IAS/Shunts: No atrial level shunt detected by color flow Doppler.  LEFT VENTRICLE PLAX 2D LVIDd:         4.70 cm   Diastology LVIDs:         3.20 cm   LV e' medial:    9.68 cm/s LV PW:         0.90 cm   LV E/e' medial:  7.9 LV IVS:        0.90 cm   LV e' lateral:   10.60 cm/s LVOT diam:     2.30 cm   LV E/e' lateral: 7.2 LVOT Area:     4.15 cm  RIGHT VENTRICLE RV Basal diam:  3.90 cm RV Mid diam:    3.70 cm TAPSE (M-mode): 1.4 cm LEFT ATRIUM              Index        RIGHT ATRIUM           Index LA diam:        3.70 cm  1.96 cm/m   RA Area:     29.70 cm LA Vol (A2C):   125.0 ml 66.31 ml/m  RA Volume:   102.00 ml 54.11 ml/m LA Vol (A4C):   97.7 ml  51.83 ml/m LA Biplane Vol: 110.0 ml 58.36 ml/m   AORTA Ao Root diam: 3.20 cm Ao Asc diam:  3.75 cm MITRAL VALVE               TRICUSPID VALVE MV Area (PHT): 4.37 cm    TR Peak grad:   14.6 mmHg MV Decel Time: 174 msec    TR Vmax:        191.00 cm/s MV E velocity: 76.75 cm/s                            SHUNTS                            Systemic Diam: 2.30 cm Jerel Croitoru MD Electronically signed by Jerel Balding MD Signature Date/Time: 05/03/2024/1:28:32 PM    Final    Portable Chest x-ray Result Date: 05/03/2024 EXAM: 1 VIEW(S) XRAY OF THE CHEST 05/03/2024 12:21:00 AM COMPARISON: Same-day chest radiograph. CLINICAL HISTORY: Endotracheal tube present. FINDINGS: LINES,  TUBES AND DEVICES: Endotracheal Tube tip in the intrathoracic trachea, 3.7 cm from the carina. Subdiaphragmatic enteric tube. LUNGS AND PLEURA: No focal pulmonary opacity. No pulmonary edema. No pleural effusion. No pneumothorax. HEART AND MEDIASTINUM: Stable cardiomediastinal silhouette compared with same-day chest radiograph. BONES AND SOFT TISSUES: No acute  osseous abnormality. IMPRESSION: 1. Endotracheal tube appropriately positioned in the intrathoracic trachea. 2. No acute cardiopulmonary process. Electronically signed by: Norman Gatlin MD 05/03/2024 12:30 AM EDT RP Workstation: [id number]   IR PERCUTANEOUS ART THROMBECTOMY/INFUSION INTRACRANIAL INC DIAG ANGIO Result Date: 05/02/2024 PROCEDURE PERFORMED: 1. Cerebral angiography with stroke thrombectomy 2. Ultrasound guided vascular access 3. Cone beam CT for treatment planning COMPARISON:  CT angiogram of the head and neck performed May 02, 2024 CLINICAL DATA:  73 year old male with acute ischemic stroke resulting in significant neurologic deficits requiring emergent intubation and life-support maneuvers. Preprocedure imaging demonstrated an acute basilar artery occlusion. Patient presents for endovascular therapy. INDICATION: Acute ischemic stroke. ANESTHESIA/SEDATION: General anesthesia was utilized for the procedure. CONTRAST:  Approximately 70 cc Ominipque 300 MEDICATIONS: See MAR FLUOROSCOPY TIME:  Fluoroscopy Time: 13 minutes 23 sec, (971 mGy). COMPLICATIONS: None immediate. BODY OF REPORT: Following a full explanation of the procedure along with the potential associated complications, an informed witnessed consent was obtained. The patient was then placed under general anesthesia by the Department of Anesthesiology at [facility]. The right femoral access site was prepped and draped in the usual sterile fashion. Ultrasound was used to study the right common femoral artery which was patent. Using real-time ultrasound guidance, a 19  gauge introducer needle was used to access the right common femoral artery. Access was performed at 21:54. A hard copy image ultrasound the saved and stored in PACS. Using this access, a 6 French sheath was placed in the descending thoracic aorta. Next, selective catheterization the left subclavian artery artery was performed. A selective arteriogram was performed which demonstrated the origin of the left vertebral artery which was patent. Next, a selective catheter was advanced into the left vertebral artery within the proximal segment. A repeat arteriogram was performed which demonstrated a left basilar occlusion within the mid segment. 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 Pretreatment TICI score 0. Next, a penumbra 43 and red 62 catheter was used to selectively catheterize the basilar artery. The first pass was performed at 22:13. This did not yield significant thrombus material and therefore a second pass was performed at 22:16. this yielded significant thrombus material and reconstitution of the basilar artery was achieved. After reviewing the imaging, it was apparent that there was slow flow within the left P2 segment. I was uncertain if this was related to acute embolus. Therefore, the penumbra 43 catheter was advanced into this artery and a single pass was performed at that site. This did not yield significant thrombus material. This pass was performed at 22:29. Evaluation of the right P1 segment also demonstrated disease at that level which was favored to be chronic. Post treatment TICI score 2b. After reviewing the imaging, I elected to terminate the procedure at this point. Evaluation of the right femoral access site demonstrated that the site was suitable for a closure device. A 6 French Angio-Seal device was deployed without complication. Cone beam CT was then performed to evaluate for intracranial hemorrhage and treatment planning. This demonstrated no evidence of significant acute  intracranial hemorrhage. The patient was removed from anesthesia and transferred to recovery in stable condition. IMPRESSION: 1. Suction thrombectomy of basilar artery occlusion. 2. Post treatment TICI score 2b (based on age-indeterminate P2 disease) PLAN: 1. To ICU for routine postoperative supportive care. Electronically Signed   By: Maude Naegeli M.D.   On: 05/02/2024 23:45   IR US  Guide Vasc Access Right Result Date: 05/02/2024 PROCEDURE PERFORMED: 1. Cerebral angiography with stroke thrombectomy  2. Ultrasound guided vascular access 3. Cone beam CT for treatment planning COMPARISON:  CT angiogram of the head and neck performed May 02, 2024 CLINICAL DATA:  73 year old male with acute ischemic stroke resulting in significant neurologic deficits requiring emergent intubation and life-support maneuvers. Preprocedure imaging demonstrated an acute basilar artery occlusion. Patient presents for endovascular therapy. INDICATION: Acute ischemic stroke. ANESTHESIA/SEDATION: General anesthesia was utilized for the procedure. CONTRAST:  Approximately 70 cc Ominipque 300 MEDICATIONS: See MAR FLUOROSCOPY TIME:  Fluoroscopy Time: 13 minutes 23 sec, (971 mGy). COMPLICATIONS: None immediate. BODY OF REPORT: Following a full explanation of the procedure along with the potential associated complications, an informed witnessed consent was obtained. The patient was then placed under general anesthesia by the Department of Anesthesiology at [facility]. The right femoral access site was prepped and draped in the usual sterile fashion. Ultrasound was used to study the right common femoral artery which was patent. Using real-time ultrasound guidance, a 19 gauge introducer needle was used to access the right common femoral artery. Access was performed at 21:54. A hard copy image ultrasound the saved and stored in PACS. Using this access, a 6 French sheath was placed in the descending thoracic aorta. Next, selective  catheterization the left subclavian artery artery was performed. A selective arteriogram was performed which demonstrated the origin of the left vertebral artery which was patent. Next, a selective catheter was advanced into the left vertebral artery within the proximal segment. A repeat arteriogram was performed which demonstrated a left basilar occlusion within the mid segment. 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 Pretreatment TICI score 0. Next, a penumbra 43 and red 62 catheter was used to selectively catheterize the basilar artery. The first pass was performed at 22:13. This did not yield significant thrombus material and therefore a second pass was performed at 22:16. this yielded significant thrombus material and reconstitution of the basilar artery was achieved. After reviewing the imaging, it was apparent that there was slow flow within the left P2 segment. I was uncertain if this was related to acute embolus. Therefore, the penumbra 43 catheter was advanced into this artery and a single pass was performed at that site. This did not yield significant thrombus material. This pass was performed at 22:29. Evaluation of the right P1 segment also demonstrated disease at that level which was favored to be chronic. Post treatment TICI score 2b. After reviewing the imaging, I elected to terminate the procedure at this point. Evaluation of the right femoral access site demonstrated that the site was suitable for a closure device. A 6 French Angio-Seal device was deployed without complication. Cone beam CT was then performed to evaluate for intracranial hemorrhage and treatment planning. This demonstrated no evidence of significant acute intracranial hemorrhage. The patient was removed from anesthesia and transferred to recovery in stable condition. IMPRESSION: 1. Suction thrombectomy of basilar artery occlusion. 2. Post treatment TICI score 2b (based on age-indeterminate P2 disease) PLAN: 1. To  ICU for routine postoperative supportive care. Electronically Signed   By: Maude Naegeli M.D.   On: 05/02/2024 23:45   CT HEAD WO CONTRAST ( ) Result Date: 05/02/2024 EXAM: CT HEAD WITHOUT CONTRAST 05/02/2024 11:06:41 PM TECHNIQUE: CT of the head was performed without the administration of intravenous contrast. Automated exposure control, iterative reconstruction, and/or weight based adjustment of the mA/kV was utilized to reduce the radiation dose to as low as reasonably achievable. COMPARISON: CT head dated 05/02/2024. CLINICAL HISTORY: Stroke, follow up; post thrombectomy basilar artery.  FINDINGS: BRAIN AND VENTRICLES: Chronic microvascular ischemia and generalized atrophy. 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 No acute hemorrhage. No evidence of acute infarct. No hydrocephalus. No extra-axial collection. No mass effect or midline shift. ORBITS: No acute abnormality. SINUSES: No acute abnormality. SOFT TISSUES AND SKULL: No acute soft tissue abnormality. No skull fracture. IMPRESSION: 1. No evidence of acute infarct. No intracranial hemorrhage. Electronically signed by: Norman Gatlin MD 05/02/2024 11:42 PM EDT RP Workstation: [id number]   CT ANGIO HEAD NECK W WO CM W PERF (CODE STROKE) Result Date: 05/02/2024 CLINICAL DATA:  Initial evaluation for acute neuro deficit, stroke. EXAM: CT ANGIOGRAPHY HEAD AND NECK CT PERFUSION BRAIN TECHNIQUE: Multidetector CT imaging of the head and neck was performed using the standard protocol during bolus administration of intravenous contrast. Multiplanar CT image reconstructions and MIPs were obtained to evaluate the vascular anatomy. Carotid stenosis measurements (when applicable) are obtained utilizing NASCET criteria, using the distal internal carotid diameter as the denominator. Multiphase CT imaging of the brain was performed following IV bolus contrast injection. Subsequent parametric perfusion maps were calculated using RAPID  software. RADIATION DOSE REDUCTION: This exam was performed according to the departmental dose-optimization program which includes automated exposure control, adjustment of the mA and/or kV according to patient size and/or use of iterative reconstruction technique. CONTRAST:  100mL OMNIPAQUE  IOHEXOL  350 MG/ML SOLN COMPARISON:  Prior CT from earlier the same day. FINDINGS: CTA NECK FINDINGS Aortic arch: Visualized aortic arch within normal limits for caliber with standard 3 vessel morphology. Aortic atherosclerosis. Right carotid system: Right common and internal carotid arteries are patent without dissection. Calcified plaque about the right carotid bulb without hemodynamically significant greater than 50% stenosis. Left carotid system: Left common and internal carotid arteries are patent without dissection. Atheromatous change about the left carotid bulb without hemodynamically significant greater than 50% stenosis. Vertebral arteries: Both vertebral arteries arise from subclavian arteries. Atheromatous plaque at the origin of the left vertebral artery with moderate stenosis (series 6, image 293). Left vertebral artery otherwise patent distally without stenosis or dissection. Right vertebral artery occluded at its origin, and remains largely occluded within the neck. Skeleton: No worrisome osseous lesions. Exaggeration of the normal cervical lordosis and visualized thoracic kyphosis. Other neck: No other acute finding. Upper chest: Mild dependent atelectatic changes noted within the visualized lung bases. 8 mm pulmonary nodule present at the left lung apex (series 8, image 155). Review of the MIP images confirms the above findings CTA HEAD FINDINGS Anterior circulation: Atheromatous change about the carotid siphons with no more than mild narrowing on the left, and mild to moderate narrowing at the para clinoid right ICA. A1 segments patent bilaterally. Normal anterior communicating complex. Anterior cerebral  arteries patent without significant stenosis. Right M1 segment widely patent. Mild narrowing of the distal left M1 segment. No proximal M2 branch occlusion or high-grade stenosis. Distal MCA branches perfused and symmetric. Posterior circulation: Atheromatous plaque about the mid left V4 segment with short-segment moderate stenosis (series 6, image 167). Left V4 segment becomes increasingly attenuated as it courses cephalad to the vertebrobasilar junction. Left PICA not seen. Right vertebral artery occluded at the skull base and remains occluded to the vertebrobasilar junction. Basilar patent proximally, but occludes just beyond the takeoff of the PICA as, consistent with acute basilar thrombosis (a series 6, image 128). Distal reconstitution at the basilar tip. Left SCA patent at its origin. Right SCA not well visualized, possibly occluded. Left PCA primarily supplied via the basilar, and  occludes at the proximal left P2 segment (series 9, image 169). Predominant fetal type origin of the right PCA. Right PCA remains patent, although demonstrates moderate to severe multifocal atheromatous stenoses. Venous sinuses: Grossly patent allowing for timing the contrast bolus. Anatomic variants: As above.  No aneurysm. Review of the MIP images confirms the above findings CT Brain Perfusion Findings: CBF (<30%) Volume: 0mL Perfusion (Tmax>6.0s) volume: Mismatch Volume: Infarction Location:No acute core infarct by CT perfusion. Delayed perfusion seen throughout the posterior circulation, involving the cerebellum and left greater than right occipital regions, in keeping with the basilar thrombosis. IMPRESSION: 1. Positive CTA for acute basilar thrombosis. Distal reconstitution at the basilar tip. Right SCA not visualized, possibly occluded. Occlusion of the left PCA at the proximal left P2 segment. Predominant fetal type origin of the right PCA which remains patent, although demonstrates moderate to severe  multifocal atheromatous stenoses. 2. Occlusion of the right vertebral artery at its origin, and remains occluded to the vertebrobasilar junction. 3. Moderate stenosis at the origin of the left vertebral artery, with moderate stenosis at the mid left V4 segment. 4. Moderate atheromatous change about the carotid bulbs and carotid siphons, but no hemodynamically significant stenosis. 5. 8 mm left upper lobe pulmonary nodule, indeterminate. Per Fleischner Society Guidelines, recommend a non-contrast Chest CT at 6-12 months. If patient is high risk for malignancy, consider an additional non-contrast Chest CT at 18-24 months. If patient is low risk for malignancy, non-contrast Chest CT at 18-24 months is optional. These guidelines do not apply to immunocompromised patients and patients with cancer. Follow up in patients with significant comorbidities as clinically warranted. For lung cancer screening, adhere to Lung-RADS guidelines. Reference: Radiology. 2017; 284(1):228-43. Aortic Atherosclerosis (ICD10-I70.0). Critical Value/emergent results were called by telephone at the time of interpretation on 05/02/2024 at 8:49 pm to provider XEM BUI , who verbally acknowledged these results. Electronically Signed   By: Morene Hoard M.D.   On: 05/02/2024 21:08   DG Abdomen 1 View Result Date: 05/02/2024 EXAM: 1 VIEW XRAY OF THE ABDOMEN 05/02/2024 08:19:00 PM COMPARISON: None available. CLINICAL HISTORY: OG placement. Reason for exam: OG placement; Per triage notes: PT BIB [facility] unresponsive. PT LKW 1530 by wife. Found in back yard by bystander at 1738 per EMS call. PT vomited profusely and began seizing per bystander. EMS witnessed seizure activity as well. EMS gave 10 mg Versed  IM, and 4mg  Zofran  IV. 148/96 100% NRB, RR 14 ETC02 30 CBG 116; 18G in BL AC's. FINDINGS: Enteric tube tip and side port likely within the stomach. IMPRESSION: 1. Enteric tube tip and side port likely within the stomach. Electronically signed by:  Norman Gatlin MD 05/02/2024 08:24 PM EDT RP Workstation: [id number]   CT Cervical Spine Wo Contrast Result Date: 05/02/2024 EXAM: CT CERVICAL SPINE WITHOUT CONTRAST 05/02/2024 07:19:00 PM TECHNIQUE: CT of the cervical spine was performed without the administration of intravenous contrast. Multiplanar reformatted images are provided for review. Automated exposure control, iterative reconstruction, and/or weight based adjustment of the mA/kV was utilized to reduce the radiation dose to as low as reasonably achievable. COMPARISON: None available. CLINICAL HISTORY: Polytrauma, blunt. No contrast; CT Head Wo Contrast; Neuro deficit, acute, stroke suspected; CT Cervical Spine Wo Contrast; Polytrauma, blunt. FINDINGS: CERVICAL SPINE: BONES AND ALIGNMENT: Trace anterolisthesis of C7 on T1. Trace retrolisthesis of C3 on C4 and C5 on C6. No facet subluxation or dislocation. No acute fracture. DEGENERATIVE CHANGES: Vertebral disc space narrowing most pronounced at C3-C4, C5-C6, and C6-C7. Facet arthrosis  and uncovertebral hypertrophy at multiple levels. Foraminal stenosis is most pronounced at C5-C6. No evidence of high grade osseous spinal canal stenosis. SOFT TISSUES: Partially visualized endotracheal tube. Biapical pleural parenchymal scarring. Patulous appearance of the upper thoracic esophagus. No prevertebral soft tissue swelling. IMPRESSION: 1. No acute abnormality of the cervical spine. Electronically signed by: Donnice Mania MD 05/02/2024 07:48 PM EDT RP Workstation: [id number]   DG Chest Port 1 View Result Date: 05/02/2024 CLINICAL DATA:  Altered mental status EXAM: PORTABLE CHEST 1 VIEW COMPARISON:  None Available. FINDINGS: Endotracheal tube tip is about 4.4 cm superior to the carina. No acute airspace disease, pleural effusion or pneumothorax. Normal cardiac size IMPRESSION: Endotracheal tube tip about 4.4 cm superior to the carina. No acute airspace disease. Electronically Signed   By: Luke Bun M.D.    On: 05/02/2024 19:45   CT Head Wo Contrast Result Date: 05/02/2024 EXAM: CT HEAD WITHOUT CONTRAST 05/02/2024 07:19:00 PM TECHNIQUE: CT of the head was performed without the administration of intravenous contrast. Automated exposure control, iterative reconstruction, and/or weight based adjustment of the mA/kV was utilized to reduce the radiation dose to as low as reasonably achievable. COMPARISON: CT head 05/12/2014 CLINICAL HISTORY: Neuro deficit, acute, stroke suspected. Polytrauma, blunt. FINDINGS: BRAIN AND VENTRICLES: No acute hemorrhage. No evidence of acute infarct. Nonspecific hypoattenuation in the periventricular and subcortical white matter, most likely representing chronic microvascular ischemic changes. Chronic microvascular changes and possible remote infarct in the right corona radiata extending into the basal ganglia. Mild parenchymal volume loss. Small remote infarct in the right cerebellum partially visualized. No hydrocephalus. No extra-axial collection. No mass effect or midline shift. Atherosclerosis of the carotid siphons and intracranial vertebral arteries. ORBITS: No acute abnormality. SINUSES: Scattered mucosal thickening throughout the ethmoid sinuses and right maxillary sinus. SOFT TISSUES AND SKULL: Partially visualized endotracheal tube. No acute soft tissue abnormality. No skull fracture. IMPRESSION: 1. No acute intracranial abnormality. 2. Chronic microvascular changes and possible remote infarct in the right corona radiata extending into the basal ganglia. 3. Small remote infarct in the right cerebellum. Electronically signed by: Donnice Mania MD 05/02/2024 07:41 PM EDT RP Workstation: [id number]    Microbiology No results found for this or any previous visit (from the past 240 hours).  Lab Basic Metabolic Panel: Recent Labs  Lab 05/09/24 1111 05/10/24 0713 05/11/24 0439 05/12/24 0138 05/13/24 0337 05/14/24 0549  NA 141 134* 138 141 141 146*  K 3.5 3.7 4.0 4.0 4.2  4.4  CL 102 98 102 106 107 109  CO2 24 22 22 24  21* 22  GLUCOSE 111* 143* 151* 148* 141* 129*  BUN 24* 27* 40* 44* 43* 63*  CREATININE 0.99 1.10 1.34* 1.23 1.16 1.64*  CALCIUM  8.8* 8.7* 8.8* 8.8* 8.9 8.8*  PHOS 3.9  --   --   --   --   --    Liver Function Tests: Recent Labs  Lab 05/09/24 1111  ALBUMIN 2.8*   No results for input(s): LIPASE, AMYLASE in the last 168 hours. No results for input(s): AMMONIA in the last 168 hours. CBC: Recent Labs  Lab 05/09/24 0307 05/10/24 0228 05/11/24 0439 05/12/24 0138 05/13/24 0337  WBC 10.4 10.2 11.9* 11.1* 15.5*  HGB 14.5 15.6 16.7 17.1* 17.4*  HCT 41.8 44.0 47.4 48.6 50.8  MCV 93.5 89.8 90.3 90.8 92.5  PLT 175 218 290 304 347   Cardiac Enzymes: No results for input(s): CKTOTAL, CKMB, CKMBINDEX, TROPONINI in the last 168 hours. Sepsis Labs: Recent Labs  Lab 05/10/24  9771 05/11/24 0439 05/12/24 0138 05/13/24 0337  WBC 10.2 11.9* 11.1* 15.5*    Procedures/Operations  9/30:    Presented after witnessed seizure by neighbor and EMS.              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 10/6: Extubated with no reintubation   Jorene Last June 03, 2024, 5:31 PM  ATTENDING NOTE: I reviewed above note and agree with it. For detailed documentation, please refer to above as I have made changes wherever appropriate.   Ary Cummins, MD PhD Stroke Neurology 2024-06-03 6:57 PM

## 2024-06-03 NOTE — Progress Notes (Signed)
 RN Roshni entered room and witnessed him take his last breath at 1700.  Was pronounce by [place].  Wife and daughter was at his side.  Chaplain service offered but wife declined because said she spend several hours with her last night.  Post mortem procedure and documentation completed.  Was taken to the morgue by nurse and tech.  Checklist completed as well.

## 2024-06-03 NOTE — Progress Notes (Signed)
 Gregory Shields [phone] [facility] hospital liaison note   Referral received from [facility] for family interest in [facility].   Spoke with patient's wife Heron by phone to explain services and hospice philosophy and all questions answered.  [facility] is able to accept patient this evening after morphine  pump is delivered. Consents are complete.    RN staff, you may call report at any time to [phone].  Room is assigned when report is called.  Please leave IV intact and send completed DNR with patient.   Updated attending and [facility] manager via [employer].  Thank you for the opportunity to participate in this patient's care.  Amy Darien BSN, RN [facility] Liaison [phone]

## 2024-06-03 NOTE — Progress Notes (Signed)
 Palliative Medicine Inpatient Follow Up Note   HPI: 73 y.o. male  with past medical history of afib (not taking eliquis  per notes) CAD, sleep apnea, and HTN  admitted on 05/02/2024 with seizure. Per note, he was found in the back yard by bystander, vomiting profusely and begun seizing. No prior history of seizure. Patient unable to protect his airway, therefore was intubated and sedated. Imaging supportive of basilar occlusion. MRI reveals multiple infarcts with pontine hemmorhage. He underwent basilar thrombectomy on 05/02/2024.    Extubated 05/08/2024.    Now presents with severe oropharygeal dysphagia with decreased secretion management making him at very severe risk for aspiration per SLP report. He is currently NPO and on continuous artificial feeding.     PMT has been consulted to assist with goals of care conversation. Patient/Family face treatment option decisions, advanced directive decisions and anticipatory care needs.    It's important to note that the patient was transitioned to comfort-focused care yesterday, as decided by the family.   Today's Discussion Jun 02, 2024  *Please note that this is a verbal dictation therefore any spelling or grammatical errors are due to the Dragon Medical One system interpretation.   Chart reviewed inclusive of vital signs, progress notes, laboratory results, and diagnostic images.   I visited the patient at bedside today, no family members were present during the encounter. The patient was resting comfortably, with deeper and slower breathing, and was unresponsive to verbal and tactile stimuli. Morphine  drip at 10mg /hr in progress.   Following the visit, I contacted the patient's wife, Heron, by phone to provide an update. She shared that she had a detailed goals-of-care discussion with the attending nurse practitioner yesterday. Given the patient's significant disease burden and limited potential for meaningful recovery, the family chose to  pursue comfort-focused care. Heron expressed peace with this decision, emphasizing her desire to ensure her husband's comfort and a peaceful passing.  We also discussed hospice options. Heron expressed interest in inpatient hospice, specifically [facility] through [facility], due to its proximity to her home, should the patient be medically stable for transfer. I provided information about what to expect with inpatient hospice, and she verbalized understanding.  Heron mentioned she plans to visit the patient this afternoon with her daughter, Therisa. I let her know I would check in during their visit if time permits. All her questions and concerns were addressed.   Palliative Support Provided.   Objective Assessment: Vital Signs Vitals:   05/14/24 1125 2024/06/02 0426  BP: 93/60 (!) 71/46  Pulse: (!) 110 (!) 116  Resp: (!) 23   Temp: 98.7 F (37.1 C) (!) 100.4 F (38 C)  SpO2: 94% 100%    Intake/Output Summary (Last 24 hours) at 2024-06-02 0941 Last data filed at 02-Jun-2024 0400 Gross per 24 hour  Intake 277.62 ml  Output 300 ml  Net -22.38 ml   Last Weight  Most recent update: 05/10/2024  6:13 AM    Weight  74.2 kg (163 lb 9.3 oz)              Physical Exam Vitals and nursing note reviewed.  Constitutional:      Appearance: He is ill-appearing.  Cardiovascular:     Rate and Rhythm: Tachycardia present.  GI: Mildly distended. Cortrak to right nare intact, artificial feeding in progress. Pulmonary:     Effort: Pulmonary effort is normal.  Musculoskeletal:     Comments: Generalized weakness   Skin:    General: Skin is warm and  dry.  Neurological:     Comments: Lethargy, orientation UTA   SUMMARY OF RECOMMENDATIONS    Code Status: DNR-comfort Placed TOC consult for inpatient hospice referral To transfer to [facility] when bed is available and stable for transfer.  Continue to provide psycho-social and emotional support to patient and family Palliative  medicine team will continue to follow.  Provide snack/comfort cart Unlimited family/friends visitation (per policy)  Symptom Management: Acetaminophen  PRN for mild pain Continue with Morphine  drip and Morphine  PRN Glycopyrrolate PRN for excessive airway secretions Lorazepam PRN for anxiety/agitation/restlessness   Time Spent: 50 minutes  Detailed review of medical records (labs, imaging, vital signs), medically appropriate exam, discussed with treatment team, counseling and education to patient, family, & staff, documenting clinical information, coordination of care.   ______________________________________________________________________________ Kathlyne Bolder NP-C [place] Palliative Medicine Team Team Cell Phone: [phone] Please utilize secure chat with additional questions, if there is no response within 30 minutes please call the above phone number  Palliative Medicine Team providers are available by phone from 7am to 7pm daily and can be reached through the team cell phone.  Should this patient require assistance outside of these hours, please call the patient's attending physician.

## 2024-06-03 NOTE — Discharge Summary (Deleted)
 Stroke Discharge Summary  Patient ID: Gregory Shields   MRN: [id number]      DOB: [birth year]  Date of Admission: 05/02/2024 Date of Discharge: 10-Jun-2024  Attending Physician:  Stroke, Md, MD Consultant(s):    Palliative Medicine   Patient's PCP:  Chet Mad, DO  DISCHARGE PRIMARY DIAGNOSIS:    Stroke: posterior circulation infarcts with BA occlusion s/p IR with TICI 2B, etiology likely Afib not on [facility]   Patient Active Problem List   Diagnosis Date Noted   Aspiration pneumonia (HCC) June 10, 2024   Essential hypertension Jun 10, 2024   CHF (congestive heart failure) (HCC) June 10, 2024   Dysphagia 06-10-2024   Leukocytosis June 10, 2024   Acute respiratory failure (HCC) 05/03/2024   Malnutrition of moderate degree 05/03/2024   Ischemic stroke (HCC) 05/02/2024   CAD (coronary artery disease) 11/19/2023   HLD (hyperlipidemia) 11/19/2023   Acute appendicitis with microperforation and periappendiceal abscess without gangrene or peritonitis 11/18/2023   Chronic a-fib (HCC) 06/21/2020   Cerebral infarction due to embolism of right middle cerebral artery (HCC) 06/18/2014   History of myocardial infarction less than 8 weeks 06/18/2014   LV (left ventricular) mural thrombus 06/18/2014   Atrial flutter (HCC) 06/18/2014   Cerebral embolism with cerebral infarction (HCC) 05/13/2014   Acute anterolateral wall MI (HCC) 05/12/2014     Allergies as of 2024/06/10       Reactions   Other Itching, Other (See Comments)   Pollen- Itchy eyes, sneezing, runny nose, etc..   Penicillins Other (See Comments)   Doesn't remember exact allergic reaction        Medication List     STOP taking these medications    aspirin  EC 325 MG tablet   clobetasol cream 0.05 % Commonly known as: TEMOVATE   FISH OIL PO   Multivitamin Men Tabs   Vitamin B Complex Tabs   VITAMIN D3 PO        LABORATORY STUDIES CBC    Component Value Date/Time   WBC 15.5 (H) 05/13/2024 0337   RBC 5.49  05/13/2024 0337   HGB 17.4 (H) 05/13/2024 0337   HCT 50.8 05/13/2024 0337   PLT 347 05/13/2024 0337   MCV 92.5 05/13/2024 0337   MCH 31.7 05/13/2024 0337   MCHC 34.3 05/13/2024 0337   RDW 12.8 05/13/2024 0337   LYMPHSABS 1.0 05/02/2024 1851   MONOABS 0.5 05/02/2024 1851   EOSABS 0.0 05/02/2024 1851   BASOSABS 0.0 05/02/2024 1851   CMP    Component Value Date/Time   NA 146 (H) 05/14/2024 0549   NA 139 06/21/2020 1650   K 4.4 05/14/2024 0549   CL 109 05/14/2024 0549   CO2 22 05/14/2024 0549   GLUCOSE 129 (H) 05/14/2024 0549   BUN 63 (H) 05/14/2024 0549   BUN 13 06/21/2020 1650   CREATININE 1.64 (H) 05/14/2024 0549   CALCIUM  8.8 (L) 05/14/2024 0549   PROT 6.5 05/02/2024 1851   PROT 6.6 06/21/2020 1650   ALBUMIN 2.8 (L) 05/09/2024 1111   ALBUMIN 4.5 06/21/2020 1650   AST 23 05/02/2024 1851   ALT 11 05/02/2024 1851   ALKPHOS 57 05/02/2024 1851   BILITOT 1.3 (H) 05/02/2024 1851   BILITOT 0.8 06/21/2020 1650   GFRNONAA 45 (L) 05/14/2024 0549   GFRAA 72 06/21/2020 1650   COAGS Lab Results  Component Value Date   INR 1.45 05/12/2014   Lipid Panel    Component Value Date/Time   CHOL 185 05/03/2024 0000   CHOL 208 (H) 06/21/2020  1650   TRIG 63 05/03/2024 0000   HDL 48 05/03/2024 0000   HDL 57 06/21/2020 1650   CHOLHDL 3.9 05/03/2024 0000   VLDL 13 05/03/2024 0000   LDLCALC 124 (H) 05/03/2024 0000   LDLCALC 134 (H) 06/21/2020 1650   HgbA1C  Lab Results  Component Value Date   HGBA1C 4.5 (L) 05/02/2024   Alcohol Level    Component Value Date/Time   ETH <15 05/03/2024 0000     SIGNIFICANT DIAGNOSTIC STUDIES DG CHEST PORT 1 VIEW Result Date: 05/13/2024 EXAM: 1 VIEW(S) XRAY OF THE CHEST 05/13/2024 10:07:00 AM COMPARISON: 05/04/2024 CLINICAL HISTORY: stroke FINDINGS: LINES, TUBES AND DEVICES: Enteric tube in place with distal tip beyond the inferior margin of the film. LUNGS AND PLEURA: Low lung volumes. Left basilar opacity, possibly representing atelectasis  and/or small pleural effusion. No pulmonary edema. No pneumothorax. HEART AND MEDIASTINUM: Aortic atherosclerosis. No acute abnormality of the cardiac and mediastinal silhouettes. BONES AND SOFT TISSUES: No acute osseous abnormality. IMPRESSION: 1. Left basilar opacity that may represent atelectasis and/or small pleural effusion. 2. Hypoinflated lungs. Electronically signed by: Waddell Calk MD 05/13/2024 01:07 PM EDT RP Workstation: [id number]   DG Abd Portable 1V Result Date: 05/10/2024 CLINICAL DATA:  Feeding tube placement. EXAM: PORTABLE ABDOMEN - 1 VIEW COMPARISON:  05/09/2024. FINDINGS: Feeding tube terminates in the gastric antrum. A second small bore tube terminates in the distal stomach. Bowel gas pattern is unremarkable. IMPRESSION: Two enteric tubes terminating in the gastric antrum and distal stomach, respectively. Are Electronically Signed   By: Newell Eke M.D.   On: 05/10/2024 13:06   DG Abd 1 View Result Date: 05/09/2024 EXAM: 1 VIEW XRAY OF THE ABDOMEN 05/09/2024 02:58:00 PM COMPARISON: None available. CLINICAL HISTORY: Nasogastric tube present. Reason for exam: Nasogastric tube present. FINDINGS: LINES, TUBES AND DEVICES: Feeding tube with tip in the mid stomach. Stylet remains within the feeding tube. BOWEL: Nonobstructive bowel gas pattern. SOFT TISSUES: No opaque urinary calculi. BONES: No acute osseous abnormality. IMPRESSION: 1. Feeding tube with tip positioned in the mid stomach. 2. Stylet remains within the feeding tube. Electronically signed by: Norleen Boxer MD 05/09/2024 03:34 PM EDT RP Workstation: [id number]   CT HEAD WO CONTRAST ( ) Result Date: 05/05/2024 EXAM: CT HEAD WITHOUT CONTRAST 05/05/2024 12:55:33 AM TECHNIQUE: CT of the head was performed without the administration of intravenous contrast. Automated exposure control, iterative reconstruction, and/or weight based adjustment of the mA/kV was utilized to reduce the radiation dose to as low as reasonably  achievable. COMPARISON: MRI dated 05/03/2024. CLINICAL HISTORY: Stroke, follow up. FINDINGS: BRAIN AND VENTRICLES: No acute hemorrhage. Multiple recent infarcts of the cerebellum, left thalamus and right pons has demonstrated on recent MRI. Chronic ischemic white matter changes. Generalized volume loss. No hydrocephalus. No extra-axial collection. No mass effect or midline shift. ORBITS: No acute abnormality. SINUSES: No acute abnormality. SOFT TISSUES AND SKULL: No acute soft tissue abnormality. No skull fracture. IMPRESSION: 1. Multiple recent infarcts of the cerebellum, left thalamus, and right pons, as demonstrated on recent MRI. 2. Chronic ischemic white matter changes. 3. Generalized volume loss. Electronically signed by: Franky Stanford MD 05/05/2024 01:51 AM EDT RP Workstation: [id number]   DG CHEST PORT 1 VIEW Result Date: 05/04/2024 CLINICAL DATA:  Oral secretions. EXAM: PORTABLE CHEST 1 VIEW COMPARISON:  05/03/2024. FINDINGS: Endotracheal tube tip is located 4.1 cm above the carina. Enteric tube tip in the stomach, to the right of midline, near the expected level of the gastric antrum. Low lung volumes with associated  accentuation of the cardiac silhouette and bronchovascular crowding. No appreciable focal consolidation, pleural effusion, or pneumothorax. Visualized osseous structures are unchanged. IMPRESSION: 1. Endotracheal tube in appropriate position. 2. Low lung volumes with bronchovascular crowding. No appreciable acute cardiopulmonary findings. Electronically Signed   By: Harrietta Sherry M.D.   On: 05/04/2024 10:52   Overnight EEG with video Result Date: 05/04/2024 Shelton Arlin KIDD, MD     05/05/2024  9:13 AM Patient Name: Haruo Stepanek MRN: [id number] Epilepsy Attending: Arlin KIDD Shelton Referring Physician/Provider: Sallyann Normie HERO, MD Duration: 05/03/2024 0730 to 05/04/2024 1128 Patient history: 73 y.o. 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 EEG to evaluate for seizure Level of alertness: comatose AEDs during EEG study: LEV, propofol Technical aspects: This EEG study was done with scalp electrodes positioned according to the 10-20 International system of electrode placement. Electrical activity was reviewed with band pass filter of 1-70Hz , sensitivity of 7 uV/mm, display speed of 77mm/sec with a 60Hz  notched filter applied as appropriate. EEG data were recorded continuously and digitally stored.  Video monitoring was available and reviewed as appropriate. Description: EEG showed continuous generalized 3 to 6 Hz theta-delta slowing admixed with 15 to 18 Hz beta activity distributed symmetrically and diffusely.  Hyperventilation and photic stimulation were not performed.  Event button was pressed on 05/03/2024 at 2000 for posturing.  Concomitant EEG before, during and after the event did not show any EEG changes suggest seizure. EEG was disconnected between 05/03/2024 1214 to 1455 for MRI brain and between 05/03/2024 2347 to 05/04/2024 0745 due to technical difficulties ABNORMALITY - Continuous slow, generalized IMPRESSION: This study is suggestive of severe diffuse encephalopathy, nonspecific etiology but likely related to sedation. No seizures or epileptiform discharges were seen throughout the recording. Event button was pressed on 05/03/2024 at 2000 for posturing without concomitant EEG change.  This was most likely not an epileptic event. Arlin KIDD Shelton   MR ANGIO HEAD WO CONTRAST Result Date: 05/03/2024 CLINICAL DATA:  Acute infarcts EXAM: MRA HEAD WITHOUT CONTRAST TECHNIQUE: Angiographic images of the Circle of Willis were acquired using MRA technique without intravenous contrast. COMPARISON:  None Available. FINDINGS: MRA intracranial: Right-side: The internal carotid arteries patent without significant stenosis. There is a large posterior  communicating artery which is patent. The middle cerebral artery is patent without severe stenosis or proximal branch occlusion. The anterior cerebral artery is patent. Left side: The internal carotid arteries patent without significant stenosis. The middle cerebral arteries patent without significant stenosis or proximal occlusion. The anterior cerebral arteries patent. Posterior circulation: The left vertebral artery is patent. There is no flow seen in the right vertebral artery. There is no basilar stenosis. There is a proximal occlusion of the left posterior cerebral artery. The right is patent. 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 The left vertebral artery is patent. The basilar artery is patent without significant stenosis. There is a proximal occlusion of the left posterior cerebral artery. The right posterior cerebral artery is mainly supplied by the posterior communicating artery. No proximal occlusion in the anterior circulation. Electronically Signed   By: Nancyann Burns M.D.   On: 05/03/2024 14:39   MR Brain W and Wo Contrast Result Date: 05/03/2024 CLINICAL DATA:  New onset seizure EXAM: MRI HEAD WITHOUT AND WITH CONTRAST TECHNIQUE: Multiplanar, multiecho pulse sequences of the brain and surrounding  structures were obtained without and with intravenous contrast. CONTRAST:  7.5mL GADAVIST GADOBUTROL 1 MMOL/ML IV SOLN COMPARISON:  CT/CTA 05/02/2024 FINDINGS: MRI brain: There are acute infarcts involving the right-side of the pons, left side of the thalamus, and bilateral cerebellar hemispheres. There is hemorrhage in the right pontine infarct. There are old lacunar infarcts involving the right periventricular white matter. No abnormal enhancement. The ventricles are normal. No mass lesion. There are normal flow signals in the carotid arteries and basilar artery. No significant bone marrow signal abnormality. No significant abnormality in the paranasal sinuses or  soft tissues. IMPRESSION: There are acute infarcts involving the right-side of the pons, left side of the thalamus and bilateral cerebellar hemispheres. There is hemorrhage in the right-side of pons Electronically Signed   By: Nancyann Burns M.D.   On: 05/03/2024 14:32   ECHOCARDIOGRAM COMPLETE Result Date: 05/03/2024    ECHOCARDIOGRAM REPORT   Patient Name:   KARAS PICKERILL Date of Exam: 05/03/2024 Medical Rec #:  [id number]    Height:       68.0 in Accession #:    [id number]   Weight:       165.3 lb Date of Birth:  06-21-1951   BSA:          1.885 m Patient Age:    72 years     BP:           121/74 mmHg Patient Gender: M            HR:           58 bpm. Exam Location:  Inpatient Procedure: 2D Echo and Intracardiac Opacification Agent (Both Spectral and Color            Flow Doppler were utilized during procedure). Indications:    Stroke  History:        Patient has prior history of Echocardiogram examinations. CAD;                 Arrythmias:Atrial Fibrillation.  Sonographer:    Charmaine Gaskins Referring Phys: [id number] NORMIE HERO BUI  Sonographer Comments: Technically challenging study due to limited acoustic windows, Technically difficult study due to poor echo windows and echo performed with patient supine and on artificial respirator. IMPRESSIONS  1. No left ventricular thrombus is seen (Definity  contrast was used). Left ventricular ejection fraction, by estimation, is 40 to 45%. The left ventricle has mildly decreased function. The left ventricle demonstrates regional wall motion abnormalities (see scoring diagram/findings for description). Left ventricular diastolic function could not be evaluated.  2. Right ventricular systolic function is normal. The right ventricular size is normal.  3. Left atrial size was severely dilated.  4. Right atrial size was severely dilated.  5. The mitral valve is normal in structure. Trivial mitral valve regurgitation. No evidence of mitral stenosis.  6. The aortic valve is normal in  structure. There is mild thickening of the aortic valve. Aortic valve regurgitation is not visualized. Aortic valve sclerosis is present, with no evidence of aortic valve stenosis. Comparison(s): No significant change from prior study. Prior images reviewed side by side. FINDINGS  Left Ventricle: No left ventricular thrombus is seen (Definity  contrast was used). Left ventricular ejection fraction, by estimation, is 40 to 45%. The left ventricle has mildly decreased function. The left ventricle demonstrates regional wall motion abnormalities. Definity  contrast agent was given IV to delineate the left ventricular endocardial borders. The left ventricular internal cavity size was normal in size. There is no left ventricular  hypertrophy. Left ventricular diastolic function could not be evaluated due to atrial fibrillation. Left ventricular diastolic function could not be evaluated.  LV Wall Scoring: The mid anteroseptal segment, apical lateral segment, apical anterior segment, and apex are akinetic. Right Ventricle: The right ventricular size is normal. No increase in right ventricular wall thickness. Right ventricular systolic function is normal. Left Atrium: Left atrial size was severely dilated. Right Atrium: Right atrial size was severely dilated. Pericardium: There is no evidence of pericardial effusion. Mitral Valve: The mitral valve is normal in structure. Trivial mitral valve regurgitation. No evidence of mitral valve stenosis. Tricuspid Valve: The tricuspid valve is normal in structure. Tricuspid valve regurgitation is mild. Aortic Valve: The aortic valve is normal in structure. There is mild thickening of the aortic valve. Aortic valve regurgitation is not visualized. Aortic valve sclerosis is present, with no evidence of aortic valve stenosis. Pulmonic Valve: The pulmonic valve was grossly normal. Pulmonic valve regurgitation is not visualized. No evidence of pulmonic stenosis. Aorta: The aortic root and  ascending aorta are structurally normal, with no evidence of dilitation. Venous: IVC assessment for right atrial pressure unable to be performed due to mechanical ventilation. IAS/Shunts: No atrial level shunt detected by color flow Doppler.  LEFT VENTRICLE PLAX 2D LVIDd:         4.70 cm   Diastology LVIDs:         3.20 cm   LV e' medial:    9.68 cm/s LV PW:         0.90 cm   LV E/e' medial:  7.9 LV IVS:        0.90 cm   LV e' lateral:   10.60 cm/s LVOT diam:     2.30 cm   LV E/e' lateral: 7.2 LVOT Area:     4.15 cm  RIGHT VENTRICLE RV Basal diam:  3.90 cm RV Mid diam:    3.70 cm TAPSE (M-mode): 1.4 cm LEFT ATRIUM              Index        RIGHT ATRIUM           Index LA diam:        3.70 cm  1.96 cm/m   RA Area:     29.70 cm LA Vol (A2C):   125.0 ml 66.31 ml/m  RA Volume:   102.00 ml 54.11 ml/m LA Vol (A4C):   97.7 ml  51.83 ml/m LA Biplane Vol: 110.0 ml 58.36 ml/m   AORTA Ao Root diam: 3.20 cm Ao Asc diam:  3.75 cm MITRAL VALVE               TRICUSPID VALVE MV Area (PHT): 4.37 cm    TR Peak grad:   14.6 mmHg MV Decel Time: 174 msec    TR Vmax:        191.00 cm/s MV E velocity: 76.75 cm/s                            SHUNTS                            Systemic Diam: 2.30 cm Jerel Croitoru MD Electronically signed by Jerel Balding MD Signature Date/Time: 05/03/2024/1:28:32 PM    Final    Portable Chest x-ray Result Date: 05/03/2024 EXAM: 1 VIEW(S) XRAY OF THE CHEST 05/03/2024 12:21:00 AM COMPARISON: Same-day chest radiograph. CLINICAL  HISTORY: Endotracheal tube present. FINDINGS: LINES, TUBES AND DEVICES: Endotracheal Tube tip in the intrathoracic trachea, 3.7 cm from the carina. Subdiaphragmatic enteric tube. LUNGS AND PLEURA: No focal pulmonary opacity. No pulmonary edema. No pleural effusion. No pneumothorax. HEART AND MEDIASTINUM: Stable cardiomediastinal silhouette compared with same-day chest radiograph. BONES AND SOFT TISSUES: No acute osseous abnormality. IMPRESSION: 1. Endotracheal tube  appropriately positioned in the intrathoracic trachea. 2. No acute cardiopulmonary process. Electronically signed by: Norman Gatlin MD 05/03/2024 12:30 AM EDT RP Workstation: [id number]   IR PERCUTANEOUS ART THROMBECTOMY/INFUSION INTRACRANIAL INC DIAG ANGIO Result Date: 05/02/2024 PROCEDURE PERFORMED: 1. Cerebral angiography with stroke thrombectomy 2. Ultrasound guided vascular access 3. Cone beam CT for treatment planning COMPARISON:  CT angiogram of the head and neck performed May 02, 2024 CLINICAL DATA:  73 year old male with acute ischemic stroke resulting in significant neurologic deficits requiring emergent intubation and life-support maneuvers. Preprocedure imaging demonstrated an acute basilar artery occlusion. Patient presents for endovascular therapy. INDICATION: Acute ischemic stroke. ANESTHESIA/SEDATION: General anesthesia was utilized for the procedure. CONTRAST:  Approximately 70 cc Ominipque 300 MEDICATIONS: See MAR FLUOROSCOPY TIME:  Fluoroscopy Time: 13 minutes 23 sec, (971 mGy). COMPLICATIONS: None immediate. BODY OF REPORT: Following a full explanation of the procedure along with the potential associated complications, an informed witnessed consent was obtained. The patient was then placed under general anesthesia by the Department of Anesthesiology at [facility]. The right femoral access site was prepped and draped in the usual sterile fashion. Ultrasound was used to study the right common femoral artery which was patent. Using real-time ultrasound guidance, a 19 gauge introducer needle was used to access the right common femoral artery. Access was performed at 21:54. A hard copy image ultrasound the saved and stored in PACS. Using this access, a 6 French sheath was placed in the descending thoracic aorta. Next, selective catheterization the left subclavian artery artery was performed. A selective arteriogram was performed which demonstrated the origin of the left vertebral  artery which was patent. Next, a selective catheter was advanced into the left vertebral artery within the proximal segment. A repeat arteriogram was performed which demonstrated a left basilar occlusion within the mid segment. 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 Pretreatment TICI score 0. Next, a penumbra 43 and red 62 catheter was used to selectively catheterize the basilar artery. The first pass was performed at 22:13. This did not yield significant thrombus material and therefore a second pass was performed at 22:16. this yielded significant thrombus material and reconstitution of the basilar artery was achieved. After reviewing the imaging, it was apparent that there was slow flow within the left P2 segment. I was uncertain if this was related to acute embolus. Therefore, the penumbra 43 catheter was advanced into this artery and a single pass was performed at that site. This did not yield significant thrombus material. This pass was performed at 22:29. Evaluation of the right P1 segment also demonstrated disease at that level which was favored to be chronic. Post treatment TICI score 2b. After reviewing the imaging, I elected to terminate the procedure at this point. Evaluation of the right femoral access site demonstrated that the site was suitable for a closure device. A 6 French Angio-Seal device was deployed without complication. Cone beam CT was then performed to evaluate for intracranial hemorrhage and treatment planning. This demonstrated no evidence of significant acute intracranial hemorrhage. The patient was removed from anesthesia and transferred to recovery in stable condition. IMPRESSION: 1. Suction thrombectomy of  basilar artery occlusion. 2. Post treatment TICI score 2b (based on age-indeterminate P2 disease) PLAN: 1. To ICU for routine postoperative supportive care. Electronically Signed   By: Maude Naegeli M.D.   On: 05/02/2024 23:45   IR US  Guide Vasc Access  Right Result Date: 05/02/2024 PROCEDURE PERFORMED: 1. Cerebral angiography with stroke thrombectomy 2. Ultrasound guided vascular access 3. Cone beam CT for treatment planning COMPARISON:  CT angiogram of the head and neck performed May 02, 2024 CLINICAL DATA:  73 year old male with acute ischemic stroke resulting in significant neurologic deficits requiring emergent intubation and life-support maneuvers. Preprocedure imaging demonstrated an acute basilar artery occlusion. Patient presents for endovascular therapy. INDICATION: Acute ischemic stroke. ANESTHESIA/SEDATION: General anesthesia was utilized for the procedure. CONTRAST:  Approximately 70 cc Ominipque 300 MEDICATIONS: See MAR FLUOROSCOPY TIME:  Fluoroscopy Time: 13 minutes 23 sec, (971 mGy). COMPLICATIONS: None immediate. BODY OF REPORT: Following a full explanation of the procedure along with the potential associated complications, an informed witnessed consent was obtained. The patient was then placed under general anesthesia by the Department of Anesthesiology at [facility]. The right femoral access site was prepped and draped in the usual sterile fashion. Ultrasound was used to study the right common femoral artery which was patent. Using real-time ultrasound guidance, a 19 gauge introducer needle was used to access the right common femoral artery. Access was performed at 21:54. A hard copy image ultrasound the saved and stored in PACS. Using this access, a 6 French sheath was placed in the descending thoracic aorta. Next, selective catheterization the left subclavian artery artery was performed. A selective arteriogram was performed which demonstrated the origin of the left vertebral artery which was patent. Next, a selective catheter was advanced into the left vertebral artery within the proximal segment. A repeat arteriogram was performed which demonstrated a left basilar occlusion within the mid segment. 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 Pretreatment TICI score 0. Next, a penumbra 43 and red 62 catheter was used to selectively catheterize the basilar artery. The first pass was performed at 22:13. This did not yield significant thrombus material and therefore a second pass was performed at 22:16. this yielded significant thrombus material and reconstitution of the basilar artery was achieved. After reviewing the imaging, it was apparent that there was slow flow within the left P2 segment. I was uncertain if this was related to acute embolus. Therefore, the penumbra 43 catheter was advanced into this artery and a single pass was performed at that site. This did not yield significant thrombus material. This pass was performed at 22:29. Evaluation of the right P1 segment also demonstrated disease at that level which was favored to be chronic. Post treatment TICI score 2b. After reviewing the imaging, I elected to terminate the procedure at this point. Evaluation of the right femoral access site demonstrated that the site was suitable for a closure device. A 6 French Angio-Seal device was deployed without complication. Cone beam CT was then performed to evaluate for intracranial hemorrhage and treatment planning. This demonstrated no evidence of significant acute intracranial hemorrhage. The patient was removed from anesthesia and transferred to recovery in stable condition. IMPRESSION: 1. Suction thrombectomy of basilar artery occlusion. 2. Post treatment TICI score 2b (based on age-indeterminate P2 disease) PLAN: 1. To ICU for routine postoperative supportive care. Electronically Signed   By: Maude Naegeli M.D.   On: 05/02/2024 23:45   CT HEAD WO CONTRAST ( ) Result Date: 05/02/2024 EXAM: CT HEAD WITHOUT  CONTRAST 05/02/2024 11:06:41 PM TECHNIQUE: CT of the head was performed without the administration of intravenous contrast. Automated exposure control, iterative reconstruction, and/or weight based adjustment of the mA/kV  was utilized to reduce the radiation dose to as low as reasonably achievable. COMPARISON: CT head dated 05/02/2024. CLINICAL HISTORY: Stroke, follow up; post thrombectomy basilar artery. FINDINGS: BRAIN AND VENTRICLES: Chronic microvascular ischemia and generalized atrophy. 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 No acute hemorrhage. No evidence of acute infarct. No hydrocephalus. No extra-axial collection. No mass effect or midline shift. ORBITS: No acute abnormality. SINUSES: No acute abnormality. SOFT TISSUES AND SKULL: No acute soft tissue abnormality. No skull fracture. IMPRESSION: 1. No evidence of acute infarct. No intracranial hemorrhage. Electronically signed by: Norman Gatlin MD 05/02/2024 11:42 PM EDT RP Workstation: [id number]   CT ANGIO HEAD NECK W WO CM W PERF (CODE STROKE) Result Date: 05/02/2024 CLINICAL DATA:  Initial evaluation for acute neuro deficit, stroke. EXAM: CT ANGIOGRAPHY HEAD AND NECK CT PERFUSION BRAIN TECHNIQUE: Multidetector CT imaging of the head and neck was performed using the standard protocol during bolus administration of intravenous contrast. Multiplanar CT image reconstructions and MIPs were obtained to evaluate the vascular anatomy. Carotid stenosis measurements (when applicable) are obtained utilizing NASCET criteria, using the distal internal carotid diameter as the denominator. Multiphase CT imaging of the brain was performed following IV bolus contrast injection. Subsequent parametric perfusion maps were calculated using RAPID software. RADIATION DOSE REDUCTION: This exam was performed according to the departmental dose-optimization program which includes automated exposure control, adjustment of the mA and/or kV according to patient size and/or use of iterative reconstruction technique. CONTRAST:  100mL OMNIPAQUE  IOHEXOL  350 MG/ML SOLN COMPARISON:  Prior CT from earlier the same day. FINDINGS: CTA NECK FINDINGS Aortic arch: Visualized  aortic arch within normal limits for caliber with standard 3 vessel morphology. Aortic atherosclerosis. Right carotid system: Right common and internal carotid arteries are patent without dissection. Calcified plaque about the right carotid bulb without hemodynamically significant greater than 50% stenosis. Left carotid system: Left common and internal carotid arteries are patent without dissection. Atheromatous change about the left carotid bulb without hemodynamically significant greater than 50% stenosis. Vertebral arteries: Both vertebral arteries arise from subclavian arteries. Atheromatous plaque at the origin of the left vertebral artery with moderate stenosis (series 6, image 293). Left vertebral artery otherwise patent distally without stenosis or dissection. Right vertebral artery occluded at its origin, and remains largely occluded within the neck. Skeleton: No worrisome osseous lesions. Exaggeration of the normal cervical lordosis and visualized thoracic kyphosis. Other neck: No other acute finding. Upper chest: Mild dependent atelectatic changes noted within the visualized lung bases. 8 mm pulmonary nodule present at the left lung apex (series 8, image 155). Review of the MIP images confirms the above findings CTA HEAD FINDINGS Anterior circulation: Atheromatous change about the carotid siphons with no more than mild narrowing on the left, and mild to moderate narrowing at the para clinoid right ICA. A1 segments patent bilaterally. Normal anterior communicating complex. Anterior cerebral arteries patent without significant stenosis. Right M1 segment widely patent. Mild narrowing of the distal left M1 segment. No proximal M2 branch occlusion or high-grade stenosis. Distal MCA branches perfused and symmetric. Posterior circulation: Atheromatous plaque about the mid left V4 segment with short-segment moderate stenosis (series 6, image 167). Left V4 segment becomes increasingly attenuated as it courses  cephalad to the vertebrobasilar junction. Left PICA not seen. Right vertebral artery occluded at the skull base  and remains occluded to the vertebrobasilar junction. Basilar patent proximally, but occludes just beyond the takeoff of the PICA as, consistent with acute basilar thrombosis (a series 6, image 128). Distal reconstitution at the basilar tip. Left SCA patent at its origin. Right SCA not well visualized, possibly occluded. Left PCA primarily supplied via the basilar, and occludes at the proximal left P2 segment (series 9, image 169). Predominant fetal type origin of the right PCA. Right PCA remains patent, although demonstrates moderate to severe multifocal atheromatous stenoses. Venous sinuses: Grossly patent allowing for timing the contrast bolus. Anatomic variants: As above.  No aneurysm. Review of the MIP images confirms the above findings CT Brain Perfusion Findings: CBF (<30%) Volume: 0mL Perfusion (Tmax>6.0s) volume: Mismatch Volume: Infarction Location:No acute core infarct by CT perfusion. Delayed perfusion seen throughout the posterior circulation, involving the cerebellum and left greater than right occipital regions, in keeping with the basilar thrombosis. IMPRESSION: 1. Positive CTA for acute basilar thrombosis. Distal reconstitution at the basilar tip. Right SCA not visualized, possibly occluded. Occlusion of the left PCA at the proximal left P2 segment. Predominant fetal type origin of the right PCA which remains patent, although demonstrates moderate to severe multifocal atheromatous stenoses. 2. Occlusion of the right vertebral artery at its origin, and remains occluded to the vertebrobasilar junction. 3. Moderate stenosis at the origin of the left vertebral artery, with moderate stenosis at the mid left V4 segment. 4. Moderate atheromatous change about the carotid bulbs and carotid siphons, but no hemodynamically significant stenosis. 5. 8 mm left upper lobe pulmonary nodule,  indeterminate. Per Fleischner Society Guidelines, recommend a non-contrast Chest CT at 6-12 months. If patient is high risk for malignancy, consider an additional non-contrast Chest CT at 18-24 months. If patient is low risk for malignancy, non-contrast Chest CT at 18-24 months is optional. These guidelines do not apply to immunocompromised patients and patients with cancer. Follow up in patients with significant comorbidities as clinically warranted. For lung cancer screening, adhere to Lung-RADS guidelines. Reference: Radiology. 2017; 284(1):228-43. Aortic Atherosclerosis (ICD10-I70.0). Critical Value/emergent results were called by telephone at the time of interpretation on 05/02/2024 at 8:49 pm to provider XEM BUI , who verbally acknowledged these results. Electronically Signed   By: Morene Hoard M.D.   On: 05/02/2024 21:08   DG Abdomen 1 View Result Date: 05/02/2024 EXAM: 1 VIEW XRAY OF THE ABDOMEN 05/02/2024 08:19:00 PM COMPARISON: None available. CLINICAL HISTORY: OG placement. Reason for exam: OG placement; Per triage notes: PT BIB [facility] unresponsive. PT LKW 1530 by wife. Found in back yard by bystander at 1738 per EMS call. PT vomited profusely and began seizing per bystander. EMS witnessed seizure activity as well. EMS gave 10 mg Versed  IM, and 4mg  Zofran  IV. 148/96 100% NRB, RR 14 ETC02 30 CBG 116; 18G in BL AC's. FINDINGS: Enteric tube tip and side port likely within the stomach. IMPRESSION: 1. Enteric tube tip and side port likely within the stomach. Electronically signed by: Norman Gatlin MD 05/02/2024 08:24 PM EDT RP Workstation: [id number]   CT Cervical Spine Wo Contrast Result Date: 05/02/2024 EXAM: CT CERVICAL SPINE WITHOUT CONTRAST 05/02/2024 07:19:00 PM TECHNIQUE: CT of the cervical spine was performed without the administration of intravenous contrast. Multiplanar reformatted images are provided for review. Automated exposure control, iterative reconstruction, and/or weight based  adjustment of the mA/kV was utilized to reduce the radiation dose to as low as reasonably achievable. COMPARISON: None available. CLINICAL HISTORY: Polytrauma, blunt. No contrast; CT Head Wo Contrast;  Neuro deficit, acute, stroke suspected; CT Cervical Spine Wo Contrast; Polytrauma, blunt. FINDINGS: CERVICAL SPINE: BONES AND ALIGNMENT: Trace anterolisthesis of C7 on T1. Trace retrolisthesis of C3 on C4 and C5 on C6. No facet subluxation or dislocation. No acute fracture. DEGENERATIVE CHANGES: Vertebral disc space narrowing most pronounced at C3-C4, C5-C6, and C6-C7. Facet arthrosis and uncovertebral hypertrophy at multiple levels. Foraminal stenosis is most pronounced at C5-C6. No evidence of high grade osseous spinal canal stenosis. SOFT TISSUES: Partially visualized endotracheal tube. Biapical pleural parenchymal scarring. Patulous appearance of the upper thoracic esophagus. No prevertebral soft tissue swelling. IMPRESSION: 1. No acute abnormality of the cervical spine. Electronically signed by: Donnice Mania MD 05/02/2024 07:48 PM EDT RP Workstation: [id number]   DG Chest Port 1 View Result Date: 05/02/2024 CLINICAL DATA:  Altered mental status EXAM: PORTABLE CHEST 1 VIEW COMPARISON:  None Available. FINDINGS: Endotracheal tube tip is about 4.4 cm superior to the carina. No acute airspace disease, pleural effusion or pneumothorax. Normal cardiac size IMPRESSION: Endotracheal tube tip about 4.4 cm superior to the carina. No acute airspace disease. Electronically Signed   By: Luke Bun M.D.   On: 05/02/2024 19:45   CT Head Wo Contrast Result Date: 05/02/2024 EXAM: CT HEAD WITHOUT CONTRAST 05/02/2024 07:19:00 PM TECHNIQUE: CT of the head was performed without the administration of intravenous contrast. Automated exposure control, iterative reconstruction, and/or weight based adjustment of the mA/kV was utilized to reduce the radiation dose to as low as reasonably achievable. COMPARISON: CT head 05/12/2014  CLINICAL HISTORY: Neuro deficit, acute, stroke suspected. Polytrauma, blunt. FINDINGS: BRAIN AND VENTRICLES: No acute hemorrhage. No evidence of acute infarct. Nonspecific hypoattenuation in the periventricular and subcortical white matter, most likely representing chronic microvascular ischemic changes. Chronic microvascular changes and possible remote infarct in the right corona radiata extending into the basal ganglia. Mild parenchymal volume loss. Small remote infarct in the right cerebellum partially visualized. No hydrocephalus. No extra-axial collection. No mass effect or midline shift. Atherosclerosis of the carotid siphons and intracranial vertebral arteries. ORBITS: No acute abnormality. SINUSES: Scattered mucosal thickening throughout the ethmoid sinuses and right maxillary sinus. SOFT TISSUES AND SKULL: Partially visualized endotracheal tube. No acute soft tissue abnormality. No skull fracture. IMPRESSION: 1. No acute intracranial abnormality. 2. Chronic microvascular changes and possible remote infarct in the right corona radiata extending into the basal ganglia. 3. Small remote infarct in the right cerebellum. Electronically signed by: Donnice Mania MD 05/02/2024 07:41 PM EDT RP Workstation: [id number]       HISTORY OF PRESENT ILLNESS 73 y.o. 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 Now s/p TICI 2B. Admitted for ICU monitoring and stroke workup. NIH on Admission: 28.   Stroke: posterior circulation infarcts with BA occlusion s/p IR with TICI 2B, etiology likely Afib not on AC  CT head No acute abnormality.  Possible remote infarct right corona radiata, extending into basal ganglia.  Small remote infarct right cerebellum. CTA head & neck with perfusion Positive CTA for acute basilar thrombosis.  Right SCA possibly occluded as it is not  visualized.  Occlusion of left PCA at proximal left P2 segment.  Occlusion of r hide ight vertebral artery at origin, remains occluded at vertebral basilar junction. Post IR CT head No evidence of acute infarct or intracranial hemorrhage MRI: Acute right pontine infarcts, left thalamic and bilateral cerebellar infarcts. Right side pontine  hemorrhage MRA  Right vertebral artery occlusion, Proximal occlusion left PCA Repeat CT head Multiple recent infarcts of the cerebellum, left thalamus, and right pons,  2D Echo EF 40-45% HgbA1c 4.5 UDS + THC  aspirin  325 mg daily Eliquis  (noncompliant) prior to admission, Eliquis  discontinued  Disposition:  Hospice   Ethics/social: Ongoing discussions with family and palliative medicine about patients current on going medical problems and patients likelihood of overcoming any medical obstacles going forward will be challenging. Discussion also included the likelihood of significant neurological recovery  and improvements of patient making a meaningful recovery is poor.  Family decided to opt for full comfort measures   Acute respiratory failure Possible ASp Pneumonia  Decreased ability to protect airway secondary to multiple acute infarcts Completed Rocephin, Unasyn  Extubated 10/6 with no reintubation  Lots of copious secretions  10/11 O2 sat down, on non-rebreather and then nasal cannula.    Seizures vs. Posturing  Likely posturing secondary to brainstem stroke, low concern for seizure. Status post Keppra 4.25 g load 9/30 LTM EEG negative for seizures   Paroxsymal Atrial fibrillation Noted in multiple prior notes is not taking eliquis  due to patient choice. Hx of LV thrombus in 2015 Outpatient cardiology appointment 2021 notes that patient stopped taking his Eliquis  approximately a year ago and is only taking aspirin . 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 Started on Eliquis  at this time as well as aspirin  and Brilinta . Outpatient cardiology appointment 2021 notes that patient stopped taking his Eliquis  approximately a year ago and is only taking aspirin .   Hypertension CHF reduced EF Home meds: None Phenylephrine off   Hyperlipidemia Home meds: None LDL 124, goal < 70    Dysphagia NPO   Other Stroke Risk Factors Advanced age greater than/equal to 34 CAD/MI with hx of LV thrombus Obstructive sleep apnea, not on CPAP at home Cardiomyopathy with EF 40-45%   Other Active Problems Leukocytosis 12.2--9.4--12.3--10.8-11.9-11.1-15.5, Afeb    DISCHARGE EXAM  PHYSICAL EXAM Mental Status:  Unresponsive. Does not open eyes  Patient does not follow commands.  No verbal output.   Cranial Nerves:  II: eyes closed III, IV, VI: Left eye with left gaze preference. Consistent with 1-1/2 syndrome.  Does not track examiner. V, VII: ETT in place.  Bilateral facial droop.  Corneal reflex bilaterally weak IX, X: Cough and gag reflexes very weak.  Uncooperative with tongue protrusion. Motor/sensory:  BUE: Flaccid BLE: flaccid. Coordination: Unable to assess Gait- deferred    Discharge Diet       Diet   Diet regular Room service appropriate? Yes; Fluid consistency: Thin   liquids  DISCHARGE PLAN Disposition: Hospice   50 minutes were spent preparing discharge.  Patient seen and examined by NP/APP with MD. MD to update note as needed.   Jorene Last, DNP, FNP-BC [facility] Pager: [phone]

## 2024-06-03 DEATH — deceased
